# Patient Record
Sex: Female | Born: 1985 | Race: Black or African American | Hispanic: Yes | Marital: Single | Smoking: Former smoker
Health system: Southern US, Community
[De-identification: ages and names within clinical notes are randomized; demographics above are authoritative.]

## PROBLEM LIST (undated history)

## (undated) ENCOUNTER — Inpatient Hospital Stay (HOSPITAL_COMMUNITY): Payer: Self-pay

## (undated) DIAGNOSIS — R51 Headache: Secondary | ICD-10-CM

## (undated) DIAGNOSIS — J45909 Unspecified asthma, uncomplicated: Secondary | ICD-10-CM

## (undated) DIAGNOSIS — K219 Gastro-esophageal reflux disease without esophagitis: Secondary | ICD-10-CM

## (undated) DIAGNOSIS — D649 Anemia, unspecified: Secondary | ICD-10-CM

## (undated) DIAGNOSIS — F4310 Post-traumatic stress disorder, unspecified: Secondary | ICD-10-CM

## (undated) DIAGNOSIS — R519 Headache, unspecified: Secondary | ICD-10-CM

## (undated) DIAGNOSIS — F32A Depression, unspecified: Secondary | ICD-10-CM

## (undated) DIAGNOSIS — T7840XA Allergy, unspecified, initial encounter: Secondary | ICD-10-CM

## (undated) DIAGNOSIS — I951 Orthostatic hypotension: Secondary | ICD-10-CM

## (undated) DIAGNOSIS — F4481 Dissociative identity disorder: Secondary | ICD-10-CM

## (undated) DIAGNOSIS — F329 Major depressive disorder, single episode, unspecified: Secondary | ICD-10-CM

## (undated) HISTORY — PX: BREAST SURGERY: SHX581

## (undated) HISTORY — DX: Allergy, unspecified, initial encounter: T78.40XA

## (undated) HISTORY — DX: Depression, unspecified: F32.A

## (undated) HISTORY — DX: Major depressive disorder, single episode, unspecified: F32.9

---

## 2011-12-29 ENCOUNTER — Encounter (HOSPITAL_COMMUNITY): Payer: Self-pay | Admitting: *Deleted

## 2011-12-29 ENCOUNTER — Emergency Department (HOSPITAL_COMMUNITY)
Admission: EM | Admit: 2011-12-29 | Discharge: 2011-12-29 | Disposition: A | Discharge status: Home / Self Care | Payer: Medicare Other | Attending: Emergency Medicine | Admitting: Emergency Medicine

## 2011-12-29 DIAGNOSIS — Z79899 Other long term (current) drug therapy: Secondary | ICD-10-CM | POA: Insufficient documentation

## 2011-12-29 DIAGNOSIS — X58XXXA Exposure to other specified factors, initial encounter: Secondary | ICD-10-CM | POA: Insufficient documentation

## 2011-12-29 DIAGNOSIS — T783XXA Angioneurotic edema, initial encounter: Secondary | ICD-10-CM | POA: Insufficient documentation

## 2011-12-29 DIAGNOSIS — J45909 Unspecified asthma, uncomplicated: Secondary | ICD-10-CM | POA: Insufficient documentation

## 2011-12-29 DIAGNOSIS — R42 Dizziness and giddiness: Secondary | ICD-10-CM | POA: Insufficient documentation

## 2011-12-29 DIAGNOSIS — T7840XA Allergy, unspecified, initial encounter: Secondary | ICD-10-CM

## 2011-12-29 HISTORY — DX: Unspecified asthma, uncomplicated: J45.909

## 2011-12-29 MED ORDER — DIPHENHYDRAMINE HCL 25 MG PO TABS: 25.0000 mg | ORAL_TABLET | Freq: Four times a day (QID) | ORAL | Status: DC

## 2011-12-29 MED ORDER — IPRATROPIUM BROMIDE 0.02 % IN SOLN
RESPIRATORY_TRACT | Status: AC
  Administered 2011-12-29: 16:00:00
  Filled 2011-12-29: qty 2.5

## 2011-12-29 MED ORDER — EPINEPHRINE 0.15 MG/0.3ML IJ DEVI: 0.1500 mg | INTRAMUSCULAR | Status: DC | PRN

## 2011-12-29 MED ORDER — ALBUTEROL SULFATE (5 MG/ML) 0.5% IN NEBU
INHALATION_SOLUTION | RESPIRATORY_TRACT | Status: AC
  Administered 2011-12-29: 16:00:00
  Filled 2011-12-29: qty 2

## 2011-12-29 MED ORDER — FAMOTIDINE 20 MG PO TABS: 20.0000 mg | ORAL_TABLET | Freq: Two times a day (BID) | ORAL | Status: DC

## 2011-12-29 MED ORDER — PREDNISONE 10 MG PO TABS: 40.0000 mg | ORAL_TABLET | Freq: Every day | ORAL | Status: DC

## 2011-12-29 MED ORDER — PREDNISONE 20 MG PO TABS
60.0000 mg | ORAL_TABLET | Freq: Once | ORAL | Status: AC
  Administered 2011-12-29: 60 mg via ORAL
  Filled 2011-12-29: qty 3

## 2011-12-29 NOTE — ED Notes (Signed)
Shift change report received from NT Mock at bedside.  

## 2011-12-29 NOTE — ED Provider Notes (Signed)
History     CSN: [id number]  Arrival date & time 12/29/11  1522   First MD Initiated Contact with Patient 12/29/11 1546      Chief Complaint  Patient presents with  . Allergic Reaction    (Consider location/radiation/quality/duration/timing/severity/associated sxs/prior treatment) Patient is a 26 y.o. female presenting with allergic reaction. The history is provided by the patient.  Allergic Reaction The primary symptoms are  wheezing, shortness of breath, dizziness and angioedema. The current episode started 1 to 2 hours ago. Progression since onset: rapidly worsened initially, now rapidly improving. This is a new problem.  It is located on the lips and tongue. The angioedema is associated with shortness of breath. The angioedema is not associated with stridor.  The onset of the reaction was associated with eating.   patiently recently started receiving allergy shots every other day, probably for weeks ago. Received a shot yesterday, no change in formulation from prior trauma. No reaction immediately after shot. Was feeling normal until eating a bowl of fruit with peanut butter today around 2:20 PM. Gave herself an EpiPen injection and took 75 mg of Benadryl. EMS administered Zantac, albuterol, Atrovent given wheezing on their arrival. At the time of my interview, she complains of only lip swelling, tongue and throat itching. She denies any dizziness, shortness of breath, chest pain. No rash. No prior similar reactions. She has apparently eaten all of the foods consumed today without any reaction in the past.  Past Medical History  Diagnosis Date  . Asthma     History reviewed. No pertinent past surgical history.  History reviewed. No pertinent family history.  History  Substance Use Topics  . Smoking status: Not on file  . Smokeless tobacco: Not on file  . Alcohol Use: Yes     social drinker    Review of Systems  HENT:       See HPI  Respiratory: Positive for shortness of  breath and wheezing. Negative for stridor.   Neurological: Positive for dizziness.  10 systems reviewed and are otherwise negative for acute change except as noted in the HPI.   Allergies  Nsaids  Home Medications   Current Outpatient Rx  Name Route Sig Dispense Refill  . ACETAMINOPHEN 500 MG PO TABS Oral Take 1,000 mg by mouth every 6 (six) hours as needed.    . BUPROPION HCL ER (XL) 150 MG PO TB24 Oral Take 150 mg by mouth daily.    Marland Kitchen CETIRIZINE HCL 10 MG PO TABS Oral Take 10 mg by mouth daily.    Marland Kitchen DIPHENHYDRAMINE HCL 12.5 MG/5ML PO ELIX Oral Take 25 mg by mouth 4 (four) times daily as needed.    Marland Kitchen EPINEPHRINE 0.15 MG/0.3ML IJ DEVI Intramuscular Inject 0.15 mg into the muscle as needed.    Marland Kitchen ESCITALOPRAM OXALATE 10 MG PO TABS Oral Take 10 mg by mouth daily.    Marland Kitchen FORMOTEROL FUMARATE 12 MCG IN CAPS Inhalation Place 12 mcg into inhaler and inhale 2 (two) times daily.    . GUAIFENESIN ER 600 MG PO TB12 Oral Take 600 mg by mouth 2 (two) times daily as needed. allergy    . MOMETASONE FUROATE 220 MCG/INH IN AEPB Inhalation Inhale 1 puff into the lungs at bedtime.    Marland Kitchen MONTELUKAST SODIUM 10 MG PO TABS Oral Take 10 mg by mouth at bedtime.    . OMEPRAZOLE 20 MG PO CPDR Oral Take 20 mg by mouth daily.      BP 105/60  Pulse  76  Temp 98.4 F (36.9 C) (Oral)  Resp 22  SpO2 100%  Physical Exam  Nursing note reviewed. Constitutional: She is oriented to person, place, and time. She appears well-developed and well-nourished. No distress.       Vital signs are reviewed and are normal.  HENT:  Head: Normocephalic and atraumatic. No trismus in the jaw.  Right Ear: Tympanic membrane and external ear normal.  Left Ear: Tympanic membrane and external ear normal.  Nose: Nose normal.  Mouth/Throat: Mucous membranes are normal. Uvula swelling (slight) present.    Eyes: Conjunctivae are normal. Pupils are equal, round, and reactive to light.  Neck: Phonation normal.  Cardiovascular: Normal rate,  regular rhythm and normal heart sounds.   Pulmonary/Chest: Effort normal and breath sounds normal. No stridor. No respiratory distress. She has no wheezes. She has no rales.  Abdominal: Soft. Bowel sounds are normal. She exhibits no distension. There is no tenderness.  Musculoskeletal: She exhibits no edema.  Neurological: She is alert and oriented to person, place, and time.       Speech clear and appropriate  Skin: Skin is warm and dry. No rash noted.  Psychiatric: She has a normal mood and affect.    ED Course  Procedures (including critical care time)  Labs Reviewed - No data to display No results found.   Date: 12/29/2011  Rate: 77  Rhythm: normal sinus rhythm  QRS Axis: normal  Intervals: normal  ST/T Wave abnormalities: normal  Conduction Disutrbances: none  Narrative Interpretation:   Old EKG Reviewed: no prior available.      1. Allergic reaction       MDM  Allergic reaction to unknown food. Epi, benadryl, zantac given PTA with near resolution of symptoms. Prednisone added in ED. Pt in no distress, airway patent, will continue to monitor for bouce-back as epi wears off.   7:33 PM Pt remains stable. Uvula swelling resolved, lip swelling near-resolved. She will f.u with allergist (with whom she has been in contact during her ED stay) and will be provided with epi pen refill. Continue pepcid, benadryl, prednisone. Return precautions discussed.  [address] [place], [place] 01/02/12 [id number]

## 2011-12-29 NOTE — ED Notes (Addendum)
Per ems pt recently started on an autoimmune medication to deal with new allergies. Pt has hx of asthma. 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 Pt ate assortment on fruit, bananas, apples, and peanut butter around 1420. Pt called ems and report that 4 min after pt ate food she could feel her lips, cheeks, and throat swelling. Denies previous allergic reaction. Primary doctor did prescribe epi pen due to new autoimmune rx and told pt to take benadryl 30 min before she takes the medication. Pt started having allergic reaction and did give herself epi pen and estimated 75 mg of benadryl.   Upon ems arrival lung fields tight, audible expiratory wheezing. 20 G L AC. ems gave 10mg  allbuterol, 0.5 mg atrovent. 50 mg zantac. No more benadryl given.   Upon rn assessment pt denies sob, reports central chest pain and facial tingling.  Pt reports that she has an appt with dr in a week to "get an EKG done' and that pt has been having chest pain x 1 week.

## 2011-12-29 NOTE — ED Notes (Signed)
Bedside report received from previous RN 

## 2012-01-04 NOTE — ED Provider Notes (Signed)
Medical screening examination/treatment/procedure(s) were performed by non-physician practitioner and as supervising physician I was immediately available for consultation/collaboration.   Nat Christen, MD 01/04/12 [phone]

## 2012-06-06 ENCOUNTER — Encounter (HOSPITAL_COMMUNITY): Payer: Self-pay | Admitting: Emergency Medicine

## 2012-06-06 DIAGNOSIS — J45901 Unspecified asthma with (acute) exacerbation: Secondary | ICD-10-CM | POA: Insufficient documentation

## 2012-06-06 MED ORDER — ALBUTEROL SULFATE (5 MG/ML) 0.5% IN NEBU
INHALATION_SOLUTION | RESPIRATORY_TRACT | Status: AC
  Administered 2012-06-06: 5 mg via RESPIRATORY_TRACT
  Filled 2012-06-06: qty 1

## 2012-06-06 MED ORDER — IPRATROPIUM BROMIDE 0.02 % IN SOLN
RESPIRATORY_TRACT | Status: AC
  Administered 2012-06-06: 0.5 mg via RESPIRATORY_TRACT
  Filled 2012-06-06: qty 2.5

## 2012-06-06 MED ORDER — IPRATROPIUM BROMIDE 0.02 % IN SOLN
0.5000 mg | Freq: Once | RESPIRATORY_TRACT | Status: AC
  Administered 2012-06-06: 0.5 mg via RESPIRATORY_TRACT

## 2012-06-06 MED ORDER — ALBUTEROL SULFATE (5 MG/ML) 0.5% IN NEBU
5.0000 mg | INHALATION_SOLUTION | Freq: Once | RESPIRATORY_TRACT | Status: AC
  Administered 2012-06-06: 5 mg via RESPIRATORY_TRACT

## 2012-06-06 NOTE — ED Notes (Signed)
Patient came into triage with wheezing and shortness of breath.  Unable to speak in full sentences.  Nebulizer treatment started in triage.

## 2012-06-07 ENCOUNTER — Emergency Department (HOSPITAL_COMMUNITY)
Admission: EM | Admit: 2012-06-07 | Discharge: 2012-06-07 | Discharge status: Against Medical Advice | Payer: Medicare Other | Attending: Emergency Medicine | Admitting: Emergency Medicine

## 2012-06-07 NOTE — ED Notes (Signed)
Called pt twice to take back to A9. No response from pt. Pt status moved off floor.

## 2012-10-14 ENCOUNTER — Ambulatory Visit (INDEPENDENT_AMBULATORY_CARE_PROVIDER_SITE_OTHER): Payer: Medicare Other | Admitting: Emergency Medicine

## 2012-10-14 VITALS — BP 110/60 | HR 90 | Temp 98.5°F | Resp 16 | Ht 65.6 in | Wt 263.0 lb

## 2012-10-14 DIAGNOSIS — F4310 Post-traumatic stress disorder, unspecified: Secondary | ICD-10-CM | POA: Insufficient documentation

## 2012-10-14 DIAGNOSIS — F4481 Dissociative identity disorder: Secondary | ICD-10-CM

## 2012-10-14 DIAGNOSIS — R51 Headache: Secondary | ICD-10-CM

## 2012-10-14 DIAGNOSIS — F319 Bipolar disorder, unspecified: Secondary | ICD-10-CM

## 2012-10-14 MED ORDER — ONDANSETRON 4 MG PO TBDP: 4.0000 mg | ORAL_TABLET | Freq: Once | ORAL | Status: DC

## 2012-10-14 NOTE — Progress Notes (Signed)
[facility] [address], [place] [address] [phone]  Date:  10/14/2012   Name:  Shannon Aguirre   DOB:  06-05-1985   MRN:  [id number]  PCP:  Jackie Plum, MD    Chief Complaint: Headache, Abdominal Pain and Emesis   History of Present Illness:  Shannon Aguirre is a 27 y.o. very pleasant female patient who presents with the following:  Has a headache over the past four days that is worse than any other headache.  She often has tension headaches but she says these are located in her temples and are unilateral.  This headache is bitemporal and extends globally. Now photophobia, but no neuro or visual symptoms.  No cough, fever chills, or coryza.  Nauseated and vomiting since last night at 0200.  No history of antecedent infection or injury.  No relief of her symptoms with tylenol. She has anaphylaxis with NSAID.  No improvement with over the counter medications or other home remedies. Denies other complaint or health concern today.   Patient Active Problem List   Diagnosis Date Noted  . PTSD (post-traumatic stress disorder) 10/14/2012  . Bipolar disorder, unspecified 10/14/2012  . 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 Asthma   . Allergy   . Depression     Past Surgical History  Procedure Laterality Date  . Breast surgery      History  Substance Use Topics  . Smoking status: Current Every Day Smoker -- 0.50 packs/day for 8 years  . Smokeless tobacco: Not on file  . Alcohol Use: Yes     Comment: social drinker    Family History  Problem Relation Age of Onset  . Bipolar disorder Mother   . Cancer Father   . Sickle cell anemia Brother   . Breast cancer Maternal Grandmother   . Diabetes Maternal Grandmother   . Diabetes Maternal Grandfather     Allergies  Allergen Reactions  . Nsaids Anaphylaxis    Medication list has been reviewed and updated.  Current Outpatient Prescriptions on File  Prior to Visit  Medication Sig Dispense Refill  . buPROPion (WELLBUTRIN XL) 150 MG 24 hr tablet Take 150 mg by mouth daily.      . formoterol (FORADIL) 12 MCG capsule for inhaler Place 12 mcg into inhaler and inhale 2 (two) times daily.      Marland Kitchen omeprazole (PRILOSEC) 20 MG capsule Take 20 mg by mouth daily.      Marland Kitchen acetaminophen (TYLENOL) 500 MG tablet Take 1,000 mg by mouth every 6 (six) hours as needed.      . cetirizine (ALL DAY ALLERGY) 10 MG tablet Take 10 mg by mouth daily.      . diphenhydrAMINE (BENADRYL) 12.5 MG/5ML elixir Take 25 mg by mouth 4 (four) times daily as needed.      . diphenhydrAMINE (BENADRYL) 25 MG tablet Take 1 tablet (25 mg total) by mouth every 6 (six) hours.  20 tablet  0  . EPINEPHrine (EPIPEN JR) 0.15 MG/0.3ML injection Inject 0.3 mLs (0.15 mg total) into the muscle as needed.  1 each  0  . escitalopram (LEXAPRO) 10 MG tablet Take 10 mg by mouth daily.      . famotidine (PEPCID) 20 MG tablet Take 1 tablet (20 mg total) by mouth 2 (two) times daily.  14 tablet  0  . guaiFENesin (MUCINEX) 600 MG 12 hr tablet Take 600 mg by mouth 2 (two) times daily  as needed. allergy      . mometasone (ASMANEX) 220 MCG/INH inhaler Inhale 1 puff into the lungs at bedtime.      . montelukast (SINGULAIR) 10 MG tablet Take 10 mg by mouth at bedtime.      . predniSONE (DELTASONE) 10 MG tablet Take 4 tablets (40 mg total) by mouth daily.  20 tablet  0   No current facility-administered medications on file prior to visit.    Review of Systems:  As per HPI, otherwise negative.    Physical Examination: Filed Vitals:   10/14/12 1659  BP: 110/60  Pulse: 90  Temp: 98.5 F (36.9 C)  Resp: 16   Filed Vitals:   10/14/12 1659  Height: 5' 5.6" (1.666 m)  Weight: 263 lb (119.296 kg)   Body mass index is 42.98 kg/(m^2). Ideal Body Weight: Weight in (lb) to have BMI = 25: 152.7  GEN: WDWN, moderate distress, Non-toxic, A & O x 3  CN 2-12 intact.  PRRERLA EOMI  Fundi benign HEENT:  Atraumatic, Normocephalic. Neck supple. No masses, No LAD. Ears and Nose: No external deformity. CV: RRR, No M/G/R. No JVD. No thrill. No extra heart sounds. PULM: CTA B, no wheezes, crackles, rhonchi. No retractions. No resp. distress. No accessory muscle use. ABD: S, NT, ND, +BS. No rebound. No HSM. EXTR: No c/c/e NEURO Normal gait.  PSYCH: Normally interactive. Conversant. Not depressed or anxious appearing.  Calm demeanor.    Assessment and Plan: Atypical headache. To ER for evaluation likely will need CT zofran   Signed,  Phillips Odor, MD

## 2013-04-05 ENCOUNTER — Other Ambulatory Visit: Payer: Self-pay

## 2013-06-23 ENCOUNTER — Ambulatory Visit (INDEPENDENT_AMBULATORY_CARE_PROVIDER_SITE_OTHER): Payer: Medicare Other | Admitting: Emergency Medicine

## 2013-06-23 VITALS — BP 110/70 | HR 70 | Temp 98.0°F | Resp 16 | Ht 65.0 in | Wt 255.0 lb

## 2013-06-23 DIAGNOSIS — R112 Nausea with vomiting, unspecified: Secondary | ICD-10-CM

## 2013-06-23 DIAGNOSIS — A088 Other specified intestinal infections: Secondary | ICD-10-CM

## 2013-06-23 LAB — POCT UA - MICROSCOPIC ONLY
Casts, Ur, LPF, POC: NEGATIVE
Crystals, Ur, HPF, POC: NEGATIVE
RBC, URINE, MICROSCOPIC: NEGATIVE
Yeast, UA: NEGATIVE

## 2013-06-23 LAB — POCT CBC
Granulocyte percent: 66 %G (ref 37–80)
HCT, POC: 40.1 % (ref 37.7–47.9)
Hemoglobin: 12.2 g/dL (ref 12.2–16.2)
Lymph, poc: 3.1 (ref 0.6–3.4)
MCH: 27.8 pg (ref 27–31.2)
MCHC: 30.4 g/dL — AB (ref 31.8–35.4)
MCV: 91.3 fL (ref 80–97)
MID (CBC): 0.4 (ref 0–0.9)
MPV: 9.8 fL (ref 0–99.8)
PLATELET COUNT, POC: 311 10*3/uL (ref 142–424)
POC Granulocyte: 6.9 (ref 2–6.9)
POC LYMPH %: 30 % (ref 10–50)
POC MID %: 4 %M (ref 0–12)
RBC: 4.39 M/uL (ref 4.04–5.48)
RDW, POC: 14.4 %
WBC: 10.5 10*3/uL — AB (ref 4.6–10.2)

## 2013-06-23 LAB — POCT URINALYSIS DIPSTICK
Bilirubin, UA: NEGATIVE
Blood, UA: NEGATIVE
GLUCOSE UA: NEGATIVE
Ketones, UA: NEGATIVE
Leukocytes, UA: NEGATIVE
NITRITE UA: NEGATIVE
Protein, UA: NEGATIVE
Spec Grav, UA: 1.02
UROBILINOGEN UA: 1
pH, UA: 7.5

## 2013-06-23 LAB — POCT URINE PREGNANCY: PREG TEST UR: NEGATIVE

## 2013-06-23 MED ORDER — ONDANSETRON 8 MG PO TBDP: 8.0000 mg | ORAL_TABLET | Freq: Three times a day (TID) | ORAL | Status: DC | PRN

## 2013-06-23 NOTE — Progress Notes (Signed)
[facility] [address], [place] [id number] [phone]  Date:  06/23/2013   Name:  Shannon Aguirre   DOB:  06/02/1985   MRN:  [id number]  PCP:  Jackie PlumSEI-BONSU,GEORGE, MD    Chief Complaint: Emesis   History of Present Illness:  Shannon Boasturea Cocke is a 28 y.o. very pleasant female patient who presents with the following:  Ill with nausea and vomiting yesterday.  Had a fever and chills.  No diarrhea.  Had poor po intake last night and today.  Today when she got up and passed out.  No cough or coryza.  Nauseated today and vomited last around 0430.  Not sexually active.  No abdominal pain.  No cough or coryza.  Some wheezing associated with exacerbation of her asthma.  No sick contact.  No improvement with over the counter medications or other home remedies. Denies other complaint or health concern today.  Patient Active Problem List   Diagnosis Date Noted  . PTSD (post-traumatic stress disorder) 10/14/2012  . Bipolar disorder, unspecified 10/14/2012  . 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 Asthma   . Allergy   . Depression     Past Surgical History  Procedure Laterality Date  . Breast surgery      History  Substance Use Topics  . Smoking status: Current Every Day Smoker -- 0.50 packs/day for 8 years  . Smokeless tobacco: Not on file  . Alcohol Use: Yes     Comment: social drinker    Family History  Problem Relation Age of Onset  . Bipolar disorder Mother   . Cancer Father   . Sickle cell anemia Brother   . Breast cancer Maternal Grandmother   . Diabetes Maternal Grandmother   . Diabetes Maternal Grandfather     Allergies  Allergen Reactions  . Nsaids Anaphylaxis    Medication list has been reviewed and updated.  Current Outpatient Prescriptions on File Prior to Visit  Medication Sig Dispense Refill  . acetaminophen (TYLENOL) 500 MG tablet Take 1,000 mg by mouth every 6 (six) hours as needed.      Marland Kitchen.  albuterol (PROVENTIL HFA;VENTOLIN HFA) 108 (90 BASE) MCG/ACT inhaler Inhale 2 puffs into the lungs every 6 (six) hours as needed for wheezing.      Marland Kitchen. buPROPion (WELLBUTRIN XL) 150 MG 24 hr tablet Take 150 mg by mouth daily.      . cetirizine (ALL DAY ALLERGY) 10 MG tablet Take 10 mg by mouth daily.      . diphenhydrAMINE (BENADRYL) 12.5 MG/5ML elixir Take 25 mg by mouth 4 (four) times daily as needed.      . diphenhydrAMINE (BENADRYL) 25 MG tablet Take 1 tablet (25 mg total) by mouth every 6 (six) hours.  20 tablet  0  . EPINEPHrine (EPIPEN JR) 0.15 MG/0.3ML injection Inject 0.3 mLs (0.15 mg total) into the muscle as needed.  1 each  0  . escitalopram (LEXAPRO) 10 MG tablet Take 10 mg by mouth daily.      . famotidine (PEPCID) 20 MG tablet Take 1 tablet (20 mg total) by mouth 2 (two) times daily.  14 tablet  0  . formoterol (FORADIL) 12 MCG capsule for inhaler Place 12 mcg into inhaler and inhale 2 (two) times daily.      Marland Kitchen. guaiFENesin (MUCINEX) 600 MG 12 hr tablet Take 600 mg by mouth 2 (two) times daily as needed. allergy      .  hydrOXYzine (ATARAX/VISTARIL) 25 MG tablet Take 25 mg by mouth 3 (three) times daily as needed for itching.      . lithium 300 MG tablet Take 300 mg by mouth 3 (three) times daily.      . mometasone (ASMANEX) 220 MCG/INH inhaler Inhale 1 puff into the lungs at bedtime.      . montelukast (SINGULAIR) 10 MG tablet Take 10 mg by mouth at bedtime.      Marland Kitchen omeprazole (PRILOSEC) 20 MG capsule Take 20 mg by mouth daily.      . predniSONE (DELTASONE) 10 MG tablet Take 4 tablets (40 mg total) by mouth daily.  20 tablet  0   Current Facility-Administered Medications on File Prior to Visit  Medication Dose Route Frequency Provider Last Rate Last Dose  . ondansetron (ZOFRAN-ODT) disintegrating tablet 4 mg  4 mg Oral Once Phillips Odor, MD        Review of Systems:  As per HPI, otherwise negative.    Physical Examination: Filed Vitals:   06/23/13 1528  BP: 110/70   Pulse: 70  Temp: 98 F (36.7 C)  Resp: 16   Filed Vitals:   06/23/13 1528  Height: 5\' 5"  (1.651 m)  Weight: 255 lb (115.667 kg)   Body mass index is 42.43 kg/(m^2). Ideal Body Weight: Weight in (lb) to have BMI = 25: 149.9  GEN: WDWN, NAD, Non-toxic, A & O x 3 HEENT: Atraumatic, Normocephalic. Neck supple. No masses, No LAD. Ears and Nose: No external deformity. CV: RRR, No M/G/R. No JVD. No thrill. No extra heart sounds. PULM: CTA B, no wheezes, crackles, rhonchi. No retractions. No resp. distress. No accessory muscle use. ABD: S, tender R & L suprapubic area, ND, +BS. No rebound. No HSM. EXTR: No c/c/e NEURO Normal gait.  PSYCH: Normally interactive. Conversant. Not depressed or anxious appearing.  Calm demeanor.    Assessment and Plan: Nausea and vomiting zofran  Signed,  Phillips Odor, MD   Results for orders placed in visit on 06/23/13  POCT URINALYSIS DIPSTICK      Result Value Range   Color, UA yellow     Clarity, UA cloudy     Glucose, UA neg     Bilirubin, UA neg     Ketones, UA neg     Spec Grav, UA 1.020     Blood, UA neg     pH, UA 7.5     Protein, UA neg     Urobilinogen, UA 1.0     Nitrite, UA neg     Leukocytes, UA Negative    POCT UA - MICROSCOPIC ONLY      Result Value Range   WBC, Ur, HPF, POC 0-2     RBC, urine, microscopic neg     Bacteria, U Microscopic 1+     Mucus, UA large     Epithelial cells, urine per micros 0-2     Crystals, Ur, HPF, POC neg     Casts, Ur, LPF, POC neg     Yeast, UA neg    POCT URINE PREGNANCY      Result Value Range   Preg Test, Ur Negative    POCT CBC      Result Value Range   WBC 10.5 (*) 4.6 - 10.2 K/uL   Lymph, poc 3.1  0.6 - 3.4   POC LYMPH PERCENT 30.0  10 - 50 %L   MID (cbc) 0.4  0 - 0.9   POC MID % 4.0  0 - 12 %M   POC Granulocyte 6.9  2 - 6.9   Granulocyte percent 66.0  37 - 80 %G   RBC 4.39  4.04 - 5.48 M/uL   Hemoglobin 12.2  12.2 - 16.2 g/dL   HCT, POC 16.1  09.6 - 47.9 %   MCV 91.3   80 - 97 fL   MCH, POC 27.8  27 - 31.2 pg   MCHC 30.4 (*) 31.8 - 35.4 g/dL   RDW, POC 04.5     Platelet Count, POC 311  142 - 424 K/uL   MPV 9.8  0 - 99.8 fL

## 2013-06-23 NOTE — Patient Instructions (Signed)
Diet The clear liquid diet consists of foods that are liquid or will become liquid at room temperature. Examples of foods allowed on a clear liquid diet include fruit juice, broth or bouillon, gelatin, or frozen ice pops. You should be able to see through the liquid. The purpose of this diet is to provide the necessary fluids, electrolytes (such as sodium and potassium), and energy to keep the body functioning during times when you are not able to consume a regular diet. A clear liquid diet should not be continued for long periods of time, as it is not nutritionally adequate.  A CLEAR LIQUID DIET MAY BE NEEDED:  When a sudden-onset (acute) condition occurs before or after surgery.   As the first step in oral feeding.   For fluid and electrolyte replacement in diarrheal diseases.   As a diet before certain medical tests are performed.  ADEQUACY The clear liquid diet is adequate only in ascorbic acid, according to the Recommended Dietary Allowances of the National Research Council.  CHOOSING FOODS Breads and Starches  Allowed: None are allowed.   Avoid: All are to be avoided.  Vegetables  Allowed: Strained vegetable juices.   Avoid: Any others.  Fruit  Allowed: Strained fruit juices and fruit drinks. Include 1 serving of citrus or vitamin C-enriched fruit juice daily.   Avoid: Any others.  Meat and Meat Substitutes  Allowed: None are allowed.   Avoid: All are to be avoided.  Milk Products  Allowed: None are allowed.   Avoid: All are to be avoided.  Soups and Combination Foods  Allowed: Clear bouillon, broth, or strained broth-based soups.   Avoid: Any others.  Desserts and Sweets  Allowed: Sugar, honey. High-protein gelatin. Flavored gelatin, ices, or frozen ice pops that do not contain milk.   Avoid: Any others.  Fats and Oils  Allowed: None are allowed.   Avoid: All are to be avoided.  Beverages  Allowed: Cereal  beverages, coffee (regular or decaffeinated), tea, or soda at the discretion of your health care provider.   Avoid: Any others.  Condiments  Allowed: Salt.   Avoid: Any others, including pepper.  Supplements  Allowed: Liquid nutrition beverages that you can see through.   Avoid: Any others that contain lactose or fiber. SAMPLE MEAL PLAN Breakfast  4 oz (120 mL) strained orange juice.   to 1 cup (120 to 240 mL) gelatin (plain or fortified).  1 cup (240 mL) beverage (coffee or tea).  Sugar, if desired. Midmorning Snack   cup (120 mL) gelatin (plain or fortified). Lunch  1 cup (240 mL) broth or consomm.  4 oz (120 mL) strained grapefruit juice.   cup (120 mL) gelatin (plain or fortified).  1 cup (240 mL) beverage (coffee or tea).  Sugar, if desired. Midafternoon Snack   cup (120 mL) fruit ice.   cup (120 mL) strained fruit juice. Dinner  1 cup (240 mL) broth or consomm.   cup (120 mL) cranberry juice.   cup (120 mL) flavored gelatin (plain or fortified).  1 cup (240 mL) beverage (coffee or tea).  Sugar, if desired. Evening Snack  4 oz (120 mL) strained apple juice (vitamin C-fortified).   cup (120 mL) flavored gelatin (plain or fortified). MAKE SURE YOU:  Understand these instructions.  Will watch your child's condition.  Will get help right away if your child is not doing well or gets worse. Document Released: 05/17/2005 Document Revised: 01/17/2013 Document Reviewed: 10/17/2012 Exit[employer] Patient Information 2014 [employer]. Viral   Gastroenteritis Viral gastroenteritis is also known as stomach flu. This condition affects the stomach and intestinal tract. It can cause sudden diarrhea and vomiting. The illness typically lasts 3 to 8 days. Most people develop an immune response that eventually gets rid of the virus. While this natural response develops, the virus can make you quite ill. CAUSES  Many different viruses can cause  gastroenteritis, such as rotavirus or noroviruses. You can catch one of these viruses by consuming contaminated food or water. You may also catch a virus by sharing utensils or other personal items with an infected person or by touching a contaminated surface. SYMPTOMS  The most common symptoms are diarrhea and vomiting. These problems can cause a severe loss of body fluids (dehydration) and a body salt (electrolyte) imbalance. Other symptoms may include:  Fever.  Headache.  Fatigue.  Abdominal pain. DIAGNOSIS  Your caregiver can usually diagnose viral gastroenteritis based on your symptoms and a physical exam. A stool sample may also be taken to test for the presence of viruses or other infections. TREATMENT  This illness typically goes away on its own. Treatments are aimed at rehydration. The most serious cases of viral gastroenteritis involve vomiting so severely that you are not able to keep fluids down. In these cases, fluids must be given through an intravenous line (IV). HOME CARE INSTRUCTIONS   Drink enough fluids to keep your urine clear or pale yellow. Drink small amounts of fluids frequently and increase the amounts as tolerated.  Ask your caregiver for specific rehydration instructions.  Avoid:  Foods high in sugar.  Alcohol.  Carbonated drinks.  Tobacco.  Juice.  Caffeine drinks.  Extremely hot or cold fluids.  Fatty, greasy foods.  Too much intake of anything at one time.  Dairy products until 24 to 48 hours after diarrhea stops.  You may consume probiotics. Probiotics are active cultures of beneficial bacteria. They may lessen the amount and number of diarrheal stools in adults. Probiotics can be found in yogurt with active cultures and in supplements.  Wash your hands well to avoid spreading the virus.  Only take over-the-counter or prescription medicines for pain, discomfort, or fever as directed by your caregiver. Do not give aspirin to children.  Antidiarrheal medicines are not recommended.  Ask your caregiver if you should continue to take your regular prescribed and over-the-counter medicines.  Keep all follow-up appointments as directed by your caregiver. SEEK IMMEDIATE MEDICAL CARE IF:   You are unable to keep fluids down.  You do not urinate at least once every 6 to 8 hours.  You develop shortness of breath.  You notice blood in your stool or vomit. This may look like coffee grounds.  You have abdominal pain that increases or is concentrated in one small area (localized).  You have persistent vomiting or diarrhea.  You have a fever.  The patient is a child younger than 3 months, and he or she has a fever.  The patient is a child older than 3 months, and he or she has a fever and persistent symptoms.  The patient is a child older than 3 months, and he or she has a fever and symptoms suddenly get worse.  The patient is a baby, and he or she has no tears when crying. MAKE SURE YOU:   Understand these instructions.  Will watch your condition.  Will get help right away if you are not doing well or get worse. Document Released: 05/17/2005 Document Revised: 08/09/2011 Document Reviewed: 03/03/2011   ExitCare Patient Information 2014 [employer].  

## 2013-07-06 ENCOUNTER — Other Ambulatory Visit: Payer: Self-pay | Admitting: Gastroenterology

## 2013-07-06 DIAGNOSIS — R1010 Upper abdominal pain, unspecified: Secondary | ICD-10-CM

## 2013-07-13 ENCOUNTER — Ambulatory Visit
Admission: RE | Admit: 2013-07-13 | Discharge: 2013-07-13 | Disposition: A | Discharge status: Home / Self Care | Payer: Medicare Other | Source: Ambulatory Visit | Attending: Gastroenterology | Admitting: Gastroenterology

## 2013-07-13 DIAGNOSIS — R1010 Upper abdominal pain, unspecified: Secondary | ICD-10-CM

## 2013-09-14 ENCOUNTER — Emergency Department (HOSPITAL_COMMUNITY)
Admission: EM | Admit: 2013-09-14 | Discharge: 2013-09-14 | Disposition: A | Discharge status: Home / Self Care | Payer: Medicare Other | Attending: Emergency Medicine | Admitting: Emergency Medicine

## 2013-09-14 ENCOUNTER — Encounter (HOSPITAL_COMMUNITY): Payer: Self-pay | Admitting: Emergency Medicine

## 2013-09-14 ENCOUNTER — Emergency Department (HOSPITAL_COMMUNITY): Payer: Medicare Other

## 2013-09-14 DIAGNOSIS — R51 Headache: Secondary | ICD-10-CM | POA: Insufficient documentation

## 2013-09-14 DIAGNOSIS — J45909 Unspecified asthma, uncomplicated: Secondary | ICD-10-CM | POA: Insufficient documentation

## 2013-09-14 DIAGNOSIS — Z3490 Encounter for supervision of normal pregnancy, unspecified, unspecified trimester: Secondary | ICD-10-CM

## 2013-09-14 DIAGNOSIS — Z8659 Personal history of other mental and behavioral disorders: Secondary | ICD-10-CM | POA: Insufficient documentation

## 2013-09-14 DIAGNOSIS — M549 Dorsalgia, unspecified: Secondary | ICD-10-CM | POA: Insufficient documentation

## 2013-09-14 DIAGNOSIS — Z79899 Other long term (current) drug therapy: Secondary | ICD-10-CM | POA: Insufficient documentation

## 2013-09-14 DIAGNOSIS — N949 Unspecified condition associated with female genital organs and menstrual cycle: Secondary | ICD-10-CM | POA: Insufficient documentation

## 2013-09-14 DIAGNOSIS — O99330 Smoking (tobacco) complicating pregnancy, unspecified trimester: Secondary | ICD-10-CM | POA: Insufficient documentation

## 2013-09-14 DIAGNOSIS — O9989 Other specified diseases and conditions complicating pregnancy, childbirth and the puerperium: Secondary | ICD-10-CM | POA: Insufficient documentation

## 2013-09-14 LAB — URINALYSIS, ROUTINE W REFLEX MICROSCOPIC
BILIRUBIN URINE: NEGATIVE
Glucose, UA: NEGATIVE mg/dL
HGB URINE DIPSTICK: NEGATIVE
Ketones, ur: NEGATIVE mg/dL
Leukocytes, UA: NEGATIVE
Nitrite: NEGATIVE
PROTEIN: NEGATIVE mg/dL
SPECIFIC GRAVITY, URINE: 1.008 (ref 1.005–1.030)
UROBILINOGEN UA: 0.2 mg/dL (ref 0.0–1.0)
pH: 6 (ref 5.0–8.0)

## 2013-09-14 LAB — WET PREP, GENITAL
Trich, Wet Prep: NONE SEEN
Yeast Wet Prep HPF POC: NONE SEEN

## 2013-09-14 LAB — ABO/RH: ABO/RH(D): O POS

## 2013-09-14 LAB — HIV ANTIBODY (ROUTINE TESTING W REFLEX): HIV 1&2 Ab, 4th Generation: NONREACTIVE

## 2013-09-14 LAB — PREGNANCY, URINE: Preg Test, Ur: POSITIVE — AB

## 2013-09-14 LAB — RPR

## 2013-09-14 MED ORDER — ACETAMINOPHEN 325 MG PO TABS
325.0000 mg | ORAL_TABLET | Freq: Once | ORAL | Status: AC
  Administered 2013-09-14: 325 mg via ORAL
  Filled 2013-09-14: qty 1

## 2013-09-14 MED ORDER — ONDANSETRON HCL 4 MG PO TABS: 4.0000 mg | ORAL_TABLET | Freq: Three times a day (TID) | ORAL | Status: DC | PRN

## 2013-09-14 MED ORDER — ONDANSETRON 4 MG PO TBDP
4.0000 mg | ORAL_TABLET | Freq: Once | ORAL | Status: AC
  Administered 2013-09-14: 4 mg via ORAL
  Filled 2013-09-14: qty 1

## 2013-09-14 NOTE — ED Notes (Signed)
Ultrasound complete 

## 2013-09-14 NOTE — ED Provider Notes (Signed)
CSN: [id number]     Arrival date & time 09/14/13  0254 History   First MD Initiated Contact with Patient 09/14/13 0330     Chief Complaint  Patient presents with  . Pelvic Pain     (Consider location/radiation/quality/duration/timing/severity/associated sxs/prior Treatment) HPI Comments: Patient has been experiencing pelvic pain for several, months.  She's been followed by her primary care physician, without definitive diagnosis.  Several days, ago.  She was told she was 6, weeks pregnant.  She has had some nausea, headaches, recently.  Tonight, while walking.  She had a sharp pain that originated in her left buttock region.  Her back causing her to almost lose her footing on the stairs.  Denies any trauma, previous history of back injury.  Denies vaginal discharge, dysuria, frequency, vaginal bleeding  Patient is a 28 y.o. female presenting with pelvic pain. The history is provided by the patient.  Pelvic Pain This is a new problem. The current episode started more than 1 month ago. The problem occurs constantly. The problem has been gradually worsening. Associated symptoms include headaches. Pertinent negatives include no chills, fever, rash or urinary symptoms. The symptoms are aggravated by walking. She has tried nothing for the symptoms. The treatment provided no relief.    Past Medical History  Diagnosis Date  . Asthma   . Allergy   . Depression    Past Surgical History  Procedure Laterality Date  . Breast surgery     Family History  Problem Relation Age of Onset  . Bipolar disorder Mother   . Cancer Father   . Sickle cell anemia Brother   . Breast cancer Maternal Grandmother   . Diabetes Maternal Grandmother   . Diabetes Maternal Grandfather    History  Substance Use Topics  . Smoking status: Current Every Day Smoker -- 0.50 packs/day for 8 years    Types: Cigarettes  . Smokeless tobacco: Not on file  . Alcohol Use: Yes     Comment: social drinker   OB History    Grav Para Term Preterm Abortions TAB SAB Ect Mult Living                 Review of Systems  Constitutional: Negative for fever and chills.  Genitourinary: Positive for pelvic pain. Negative for dysuria, vaginal bleeding and vaginal discharge.  Musculoskeletal: Positive for back pain.  Skin: Negative for rash and wound.  Neurological: Positive for headaches. Negative for dizziness.  All other systems reviewed and are negative.     Allergies  Nsaids  Home Medications   Prior to Admission medications   Medication Sig Start Date End Date Taking? Authorizing Provider  acetaminophen (TYLENOL) 500 MG tablet Take 1,000 mg by mouth every 6 (six) hours as needed.   Yes Historical Provider, MD  albuterol (PROVENTIL HFA;VENTOLIN HFA) 108 (90 BASE) MCG/ACT inhaler Inhale 2 puffs into the lungs every 6 (six) hours as needed for wheezing.   Yes Historical Provider, MD  albuterol (PROVENTIL) (2.5 MG/3ML) 0.083% nebulizer solution Take 2.5 mg by nebulization every 6 (six) hours as needed for wheezing or shortness of breath.   Yes Historical Provider, MD  diphenhydrAMINE (BENADRYL) 12.5 MG/5ML elixir Take 25 mg by mouth 4 (four) times daily as needed.   Yes Historical Provider, MD  Prenatal Vit-Fe Fumarate-FA (PRENATAL MULTIVITAMIN) TABS tablet Take 1 tablet by mouth daily at 12 noon.   Yes Historical Provider, MD  EPINEPHrine (EPIPEN JR) 0.15 MG/0.3ML injection Inject 0.3 mLs (0.15 mg total) into the  muscle as needed. 12/29/11   Shaaron AdlerStephanie Justice Wolfe, PA-C   BP 115/57  Pulse 77  Temp(Src) 97.9 F (36.6 C) (Oral)  Resp 18  Ht 5\' 4"  (1.626 m)  Wt 273 lb (123.832 kg)  BMI 46.84 kg/m2  SpO2 98%  LMP 07/25/2013 Physical Exam  Nursing note and vitals reviewed. Constitutional: She appears well-developed and well-nourished.  HENT:  Head: Normocephalic.  Eyes: Pupils are equal, round, and reactive to light.  Neck: Normal range of motion.  Cardiovascular: Normal rate and regular rhythm.    Pulmonary/Chest: Effort normal and breath sounds normal.  Abdominal: Soft. Bowel sounds are normal.  Obese  Genitourinary: Vagina normal. No vaginal discharge found.  Mild tenderness.  On exam  Musculoskeletal: Normal range of motion.  Neurological: She is alert.  Skin: Skin is warm.    ED Course  Procedures (including critical care time) Labs Review Labs Reviewed  WET PREP, GENITAL - Abnormal; Notable for the following:    Clue Cells Wet Prep HPF POC FEW (*)    WBC, Wet Prep HPF POC FEW (*)    All other components within normal limits  PREGNANCY, URINE - Abnormal; Notable for the following:    Preg Test, Ur POSITIVE (*)    All other components within normal limits  GC/CHLAMYDIA PROBE AMP  URINALYSIS, ROUTINE W REFLEX MICROSCOPIC  RPR  HIV ANTIBODY (ROUTINE TESTING)  ABO/RH    Imaging Review No results found.   EKG Interpretation None      MDM  Will obtain US to rule out ectopic  Final diagnoses:  Pregnancy  Round ligament pain         Arman FilterGail K Elisabet Gutzmer, NP 09/16/13 2037

## 2013-09-14 NOTE — ED Provider Notes (Signed)
Discussed case with Earley FavorGail Schulz, NP. Transfer of care to Earley FavorGail Schulz, NP at change in shift.   Lanae Boasturea Knick is a 28 y/o F with PMHx of asthma, allergies, and depression presenting to the ED with pelvic pain for the past 2 weeks with worsening the other day with radiation down to her buttocks. Patient was just diagnosed with being pregnant - approximately [redacted] weeks gestation.  Physical and pelvic exam unremarkable - as per previous provider.   Plan: Ultrasound to rule out ectopic, plan for discharge with outpatient follow-up.  Results for orders placed during the hospital encounter of 09/14/13  WET PREP, GENITAL      Result Value Ref Range   Yeast Wet Prep HPF POC NONE SEEN  NONE SEEN   Trich, Wet Prep NONE SEEN  NONE SEEN   Clue Cells Wet Prep HPF POC FEW (*) NONE SEEN   WBC, Wet Prep HPF POC FEW (*) NONE SEEN  URINALYSIS, ROUTINE W REFLEX MICROSCOPIC      Result Value Ref Range   Color, Urine YELLOW  YELLOW   APPearance CLEAR  CLEAR   Specific Gravity, Urine 1.008  1.005 - 1.030   pH 6.0  5.0 - 8.0   Glucose, UA NEGATIVE  NEGATIVE mg/dL   Hgb urine dipstick NEGATIVE  NEGATIVE   Bilirubin Urine NEGATIVE  NEGATIVE   Ketones, ur NEGATIVE  NEGATIVE mg/dL   Protein, ur NEGATIVE  NEGATIVE mg/dL   Urobilinogen, UA 0.2  0.0 - 1.0 mg/dL   Nitrite NEGATIVE  NEGATIVE   Leukocytes, UA NEGATIVE  NEGATIVE  PREGNANCY, URINE      Result Value Ref Range   Preg Test, Ur POSITIVE (*) NEGATIVE  ABO/RH      Result Value Ref Range   ABO/RH(D) O POS     [place] Ob Comp Less 14 Wks  09/14/2013   CLINICAL DATA:  Abdominal pain  EXAM: OBSTETRIC <14 WK US AND TRANSVAGINAL OB US  TECHNIQUE: Both transabdominal and transvaginal ultrasound examinations were performed for complete evaluation of the gestation as well as the maternal uterus, adnexal regions, and pelvic cul-de-sac. Transvaginal technique was performed to assess early pregnancy.  COMPARISON:  None.  FINDINGS: Intrauterine gestational sac:  Visualized/normal in shape.  Yolk sac:  Present.  Embryo:  Present.  Cardiac Activity: Present.  Heart Rate:  105 bpm  CRL:   3.3  mm   6 w 0d                  US EDC: 05/10/2014  Maternal uterus/adnexae: Normal bilateral ovaries. No adnexal mass. No pelvic free fluid.  IMPRESSION: Single live intrauterine pregnancy dating 6 weeks 0 days with an ultrasound EDC of 05/10/2014.   Electronically Signed   By: Elige KoHetal  Patel   On: 09/14/2013 06:27   [place] Ob Transvaginal  09/14/2013   CLINICAL DATA:  Abdominal pain  EXAM: OBSTETRIC <14 WK US AND TRANSVAGINAL OB US  TECHNIQUE: Both transabdominal and transvaginal ultrasound examinations were performed for complete evaluation of the gestation as well as the maternal uterus, adnexal regions, and pelvic cul-de-sac. Transvaginal technique was performed to assess early pregnancy.  COMPARISON:  None.  FINDINGS: Intrauterine gestational sac: Visualized/normal in shape.  Yolk sac:  Present.  Embryo:  Present.  Cardiac Activity: Present.  Heart Rate:  105 bpm  CRL:   3.3  mm   6 w 0d                  US EDC:  05/10/2014  Maternal uterus/adnexae: Normal bilateral ovaries. No adnexal mass. No pelvic free fluid.  IMPRESSION: Single live intrauterine pregnancy dating 6 weeks 0 days with an ultrasound EDC of 05/10/2014.   Electronically Signed   By: Elige KoHetal  Patel   On: 09/14/2013 06:27   Ultrasound noted single live intrauterine pregnancy dating 6 weeks 1 days. Negative findings of ectopic pregnancy.   7:14 AM This provider re-assessed the patient. Patient reported that she is feeling well, but having a little headache. Reported that she has been having this issue for the past couple of months, but has been looking into it and being followed up by her PCP regarding the headaches - worsening within the past 2 weeks, throbbing headaches with photophobia. Cannot take NSAIDs due to anaphylactic reaction. Pelvic pain intermittent for the past 1-2 months - just diagnosed with being [redacted] weeks  pregnant, 09/11/2013. Reported that this is her first child and LMP 07/25/2013.  Patient sitting comfortable in bed. Heart rate and rhythm normal. Lungs clear to auscultation to upper and lower lobes bilaterally. Radial and DP pulses 2+ bilaterally. Obese. BS normoactive in all 4 quadrants with negative pain upon palpation to the abdomen - soft - negative peritoneal signs - negative rigidity - nonsurgical abdomen.   Diagnoses that have been ruled out:  None  Diagnoses that are still under consideration:  None  Final diagnoses:  Pregnancy  Round ligament pain    Medications  ondansetron (ZOFRAN-ODT) disintegrating tablet 4 mg (4 mg Oral Given 09/14/13 0339)  acetaminophen (TYLENOL) tablet 325 mg (325 mg Oral Given 09/14/13 0721)   Filed Vitals:   09/14/13 0256 09/14/13 0704 09/14/13 0801  BP: 120/55 99/48 115/57  Pulse: 84 78 77  Temp: 97.9 F (36.6 C)    TempSrc: Oral    Resp: 20 16 18   Height: 5\' 4"  (1.626 m)    Weight: 273 lb (123.832 kg)    SpO2:  98% 98%   Negative findings of ectopic pregnancy. Doubt TOA. Doubt acute abdominal processes. Suspicion discomfort secondary to new pregnancy. Patient stable, afebrile. Tylenol given in the ED setting for headache. Patient appears well. Discussed imaging and labs with patient. Discussed with patient that most of her symptoms are due to her pregnancy, ligament syndrome. Discussed with patient to rest and stay hydrated. Patient does not have an OBGYN, will discharge patient with referral sheet. Discussed with patient to continue prenatal vitamins. Discussed with patient to avoid any physical or strenuous activity. Discussed with patient to closely monitor symptoms and if symptoms are to worsen or change to report back to the ED - strict return instructions given.  Patient agreed to plan of care, understood, all questions answered.   Raymon MuttonMarissa Lakiesha Ralphs, PA-C 09/14/13 1723

## 2013-09-14 NOTE — Discharge Instructions (Signed)
Please call your doctor for a followup appointment within 24-48 hours. When you talk to your doctor please let them know that you were seen in the emergency department and have them acquire all of your records so that they can discuss the findings with you and formulate a treatment plan to fully care for your new and ongoing problems. Please call and set-up an appointment without primary care provider within the next 24-48 hours to be re-assessed Please rest and stay hydrated Please continue prenatal vitamins Please no more than 1,200 mg of Tylenol per day - descreased amount in pregnancy Please call and set-up an appointment with [facility] to be re-assessed  Please continue to monitor symptoms closely and if symptoms are to worsen or change (fever greater than 101, nausea, vomiting, diarrhea, chest pain, shortness of breath, difficulty breathing, numbness, tingling, blood in the stools, black for stools, vaginal spotting, vaginal pain, back pain) please report back to the ED immediately  Pregnancy If you are planning on getting pregnant, it is a good idea to make a preconception appointment with your caregiver to discuss having a healthy lifestyle before getting pregnant. This includes diet, weight, exercise, taking prenatal vitamins (especially folic acid, which helps prevent brain and spinal cord defects), avoiding alcohol, smoking and illegal drugs, medical problems (diabetes, convulsions), family history of genetic problems, working conditions, and immunizations. It is better to have knowledge of these things and do something about them before getting pregnant. During your pregnancy, it is important to follow certain guidelines in order to have a healthy baby. It is very important to get good prenatal care and follow your caregiver's instructions. Prenatal care includes all the medical care you receive before your baby's birth. This helps to prevent problems during the pregnancy and  childbirth. HOME CARE INSTRUCTIONS   Start your prenatal visits by the 12th week of pregnancy or earlier, if possible. At first, appointments are usually scheduled monthly. They become more frequent in the last 2 months before delivery. It is important that you keep your caregiver's appointments and follow your caregiver's instructions regarding medication use, exercise, and diet.  During pregnancy, you are providing food for you and your baby. Eat a regular, well-balanced diet. Choose foods such as meat, fish, milk and other dairy products, vegetables, fruits, whole-grain breads and cereals. Your caregiver will inform you of the ideal weight gain depending on your current height and weight. Drink lots of liquids. Try to drink 8 glasses of water a day.  Alcohol is associated with a number of birth defects including fetal alcohol syndrome. It is best to avoid alcohol completely. Smoking will cause low birth rate and prematurity. Use of alcohol and nicotine during your pregnancy also increases the chances that your child will be chemically dependent later in their life and may contribute to SIDS (Sudden Infant Death Syndrome).  Do not use illegal drugs.  Only take prescription or over-the-counter medications that are recommended by your caregiver. Other medications can cause genetic and physical problems in the baby.  Morning sickness can often be helped by keeping soda crackers at the bedside. Eat a few before getting up in the morning.  A sexual relationship may be continued until near the end of pregnancy if there are no other problems such as early (premature) leaking of amniotic fluid from the membranes, vaginal bleeding, painful intercourse or belly (abdominal) pain.  Exercise regularly. Check with your caregiver if you are unsure of the safety of some of your exercises.  Do  not use hot tubs, steam rooms or saunas. These increase the risk of fainting and hurting yourself and the baby. Swimming  is OK for exercise. Get plenty of rest, including afternoon naps when possible, especially in the third trimester.  Avoid toxic odors and chemicals.  Do not wear high heels. They may cause you to lose your balance and fall.  Do not lift over 5 pounds. If you do lift anything, lift with your legs and thighs, not your back.  Avoid long trips, especially in the third trimester.  If you have to travel out of the city or state, take a copy of your medical records with you. SEEK IMMEDIATE MEDICAL CARE IF:   You develop an unexplained oral temperature above 102 F (38.9 C), or as your caregiver suggests.  You have leaking of fluid from the vagina. If leaking membranes are suspected, take your temperature and inform your caregiver of this when you call.  There is vaginal spotting or bleeding. Notify your caregiver of the amount and how many pads are used.  You continue to feel sick to your stomach (nauseous) and have no relief from remedies suggested, or you throw up (vomit) blood or coffee ground like materials.  You develop upper abdominal pain.  You have round ligament discomfort in the lower abdominal area. This still must be evaluated by your caregiver.  You feel contractions of the uterus.  You do not feel the baby move, or there is less movement than before.  You have painful urination.  You have abnormal vaginal discharge.  You have persistent diarrhea.  You get a severe headache.  You have problems with your vision.  You develop muscle weakness.  You feel dizzy and faint.  You develop shortness of breath.  You develop chest pain.  You have back pain that travels down to your leg and feet.  You feel irregular or a very fast heartbeat.  You develop excessive weight gain in a short period of time (5 pounds in 3 to 5 days).  You are involved in a domestic violence situation. Document Released: 05/17/2005 Document Revised: 11/16/2011 Document Reviewed:  11/08/2008 [facility] Patient Information 2014 [place], [place].   Emergency Department Resource Guide 1) Find a Doctor and Pay Out of Pocket Although you won't have to find out who is covered by your insurance plan, it is a good idea to ask around and get recommendations. You will then need to call the office and see if the doctor you have chosen will accept you as a new patient and what types of options they offer for patients who are self-pay. Some doctors offer discounts or will set up payment plans for their patients who do not have insurance, but you will need to ask so you aren't surprised when you get to your appointment.  2) Contact Your Local Health Department Not all health departments have doctors that can see patients for sick visits, but many do, so it is worth a call to see if yours does. If you don't know where your local health department is, you can check in your phone book. The CDC also has a tool to help you locate your state's health department, and many state websites also have listings of all of their local health departments.  3) Find a Walk-in Clinic If your illness is not likely to be very severe or complicated, you may want to try a walk in clinic. These are popping up all over the country in pharmacies, drugstores, and  shopping centers. They're usually staffed by nurse practitioners or physician assistants that have been trained to treat common illnesses and complaints. They're usually fairly quick and inexpensive. However, if you have serious medical issues or chronic medical problems, these are probably not your best option.  No Primary Care Doctor: - Call Health Connect at  [phone] - they can help you locate a primary care doctor that  accepts your insurance, provides certain services, etc. - Physician Referral Service- [phone]  Chronic Pain Problems: Organization         Address  Phone   Notes  [facility]  [phone] Patients need to  be referred by their primary care doctor.   Medication Assistance: Organization         Address  Phone   Notes  [facility] Medication [facility] [address] [place], [place] [address] [phone] --Must be a resident of [facility] -- Must have NO insurance coverage whatsoever (no Medicaid/ Medicare, etc.) -- The pt. MUST have a primary care doctor that directs their care regularly and follows them in the community   [facility]ist  [phone]   [employer]  [phone]    Agencies that provide inexpensive medical care: Organization         Address  Phone   Notes  [place]  [phone]   [facility]    [phone]   [facility] [place], [place] [address] [phone]   [place] [address] [place] [phone]   Planned Parenthood    [phone]   [facility]    [phone]   [place] and [address], [place] Phone:  [phone], Fax:  [phone] Hours of Operation:  9 am - 6 pm, M-F.  Also accepts Medicaid/Medicare and self-pay.  [facility] for [address], [address], [place] Phone: [phone], Fax: [phone]. Hours of Operation:  8:30 am - 5:30 pm, M-F.  Also accepts Medicaid and self-pay.  [facility] [address], [place] Phone: [phone]   [place], [place], [place] [phone], Ext. [phone] Mondays & Thursdays: 7-9 AM.  First 15 patients are seen on a first come, first serve basis.    [place] Providers:  Organization         Address  Phone   Notes  [facility] [address], [place] [phone] Also accepts self-pay patients.  [facility] [address], [place]  [phone]   [place], [address], [place] [phone]   [facility] [address] [place] [phone]   Lucianne Lei [address], [place]   [phone] Only accepts [place] Access [place] patients after they have their name applied to their card.   Self-Pay (no insurance) in [facility]:  Organization         Address  Phone   Notes  Sickle Cell Patients, [facility] [place] [phone]   [facility] [place] [phone]   [facility]gent [place]  [place] [address] [place], [place] [phone]  [facility]/Dr. Osei-Bonsu  [address], [place] or [address], [place] [phone] Phone number for both [place] and [place] locations is the same.  Urgent Medical and [facility] [address] [place] [phone]   [facility] [address], [place] or [address] [phone]   [facility] [address] [place] [phone], phone; [phone], fax Sees patients 1st and 3rd Saturday of every month.  Must not qualify for public or private insurance (i.e. Medicaid, Medicare, [place] Health Choice, Veterans' Benefits)  Household income should be no more than 200% of the poverty level The clinic cannot treat you if you are pregnant or think you are pregnant  Sexually transmitted diseases are not treated at the clinic.    Dental Care: Organization         Address  Phone  Notes  [facility] Department of [facility] [place] [phone] Accepts children up to age 15 who are enrolled in [place] or [place]; pregnant women with a Medicaid card; and children who have applied for Medicaid or [place] Health Choice, but were declined, whose parents can  pay a reduced fee at time of service.  [facility] Department of [facility]  [address], [place] [phone] Accepts children up to age 9 who are enrolled in [place] or [place]; pregnant women with a Medicaid card; and children who have applied for Medicaid or [place] Health Choice, but were declined, whose parents can pay a reduced fee at time of service.  [place] Adult Dental Access PROGRAM  [place] [phone] Patients are seen by appointment only. Walk-ins are not accepted. [place] will see patients 43 years of age and older. Monday - Tuesday (8am-5pm) Most Wednesdays (8:30-5pm) $30 per visit, cash only  [facility] Adult Dental Access PROGRAM  [address], [facility] [phone] Patients are seen by appointment only. Walk-ins are not accepted. [place] will see patients 46 years of age and older. One Wednesday Evening (Monthly: Volunteer Based).  $30 per visit, cash only  [place]  [phone] for adults; Children under age 23, call Graduate Pediatric Dentistry at [phone]. Children aged 65-14, please call [phone] to request a pediatric application.  Dental services are provided in all areas of dental care including fillings, crowns and bridges, complete and partial dentures, implants, gum treatment, root canals, and extractions. Preventive care is also provided. Treatment is provided to both adults and children. Patients are selected via a lottery and there is often a waiting list.   [facility] [address] [place]  [phone] [email or web address]   [facility] [address] [place], [place] [phone], Ext. [phone] Second and Fourth Thursday of each month, opens at 6:30 AM; Clinic ends at 9 AM.  Patients are seen on a first-come first-served basis, and a limited number are seen during each clinic.   [facility]  [address] Hillard Danker [place], [place] [phone]   Eligibility Requirements You must have lived in [place], [place], or [place] counties for at least the last three months.   You cannot be eligible for state or federal sponsored [employer], including [employer], [place], or [employer].   You generally cannot be eligible for healthcare insurance  through your employer.    How to apply: Eligibility screenings are held every Tuesday and Wednesday afternoon from 1:00 pm until 4:00 pm. You do not need an appointment for the interview!  [facility] [address], [place], [place] [phone]   [facility]  [phone]   [facility]  [phone]   [facility]  [phone]    Behavioral Health Resources in the Community: Intensive Outpatient Programs Organization         Address  Phone  Notes  [facility] [address], [place], [place] [phone]   [facility] [address], [place], [place] [phone]   [facility] [address], [place], [place]  [phone]   [facility] [address],  [place], [place] [phone] or [phone]   Substance Abuse Resources Organization         Address  Phone  Notes  [facility]  [phone]   [facility]  [phone]   [facility]  [phone]   [facility]  [phone]   Residential & Outpatient Substance Abuse Program  [phone]   Psychological Services Organization         Address  Phone  Notes  [facility]  [phone]- [phone]   [facility]  [phone]- [phone]   [facility] [address], [place] [phone] or [phone]    Mobile Crisis Teams Organization         Address  Phone  Notes  [facility], Mobile Crisis Care Unit  [phone]    [facility]  [address] [place], [place] [phone]   Doristine LocksSharon DeEsch [address] [place] [phone]    Self-Help/Support Groups Organization         Address  Phone             Notes  Mental Health Assoc. of  - variety of support groups  [phone] Call for more information  Narcotics Anonymous (NA), [facility] [address], [place]  2 meetings at this location   Statisticianesidential Treatment Programs Organization         Address  Phone  Notes  [facility] [address],    [place]  [phone]   [facility]  [address], [place] [phone], [place], [place] [phone]   [facility] [address], [place] [phone] Admissions: 8am-3pm M-F  [facility] [address]    [place], [place] [phone]   [facility] [address], [place], [place] [phone]   [facility] [address]  [place], [place] [phone]   [facility] - Intensive Outpatient [address], [place], [place] [phone]   [facility] ([facility] [address]  [place], [place] [phone] or [phone]   [facility] ([facility]) [address] [place], [place] [phone] Accepts Medicaid  [facility] [address]  [place] [phone] Substance Abuse/Addiction Treatment   [facility] Resources Organization         Address  Phone  Notes  [facility]  [phone]   Angie FavaJulie Brannon, PhD [address] [place], [place]   [phone] or [phone]   [facility]   [address] [place], [place] [phone]  [facility] [address] [place], [place] [phone] Insurance/Medicaid/sponsorship through [employer] and Families [address] [place], [place] [phone] [place], [place] [phone]    Dr. Adele Schilder  [phone]   [facility] Dept. 1) [address] [place] 2) [place] 3)  [place] [address], [place] [phone]  [phone]   [place] [phone] or [phone] (After Hours)

## 2013-09-14 NOTE — ED Notes (Signed)
Pt states she has been having pelvic pain several weeks worse over the past two weeks  Today she states she developed today and it radiates down her butt  Pt states the pain is worse when she walks  Pt states she also has a migraine and is very nauseated  Pt states she found out the other day that she is [redacted] weeks pregnant by her PCP

## 2013-09-14 NOTE — ED Notes (Signed)
Ultrasound in progress  

## 2013-09-15 LAB — GC/CHLAMYDIA PROBE AMP
CT PROBE, AMP APTIMA: NEGATIVE
GC PROBE AMP APTIMA: NEGATIVE

## 2013-09-20 NOTE — ED Provider Notes (Signed)
Medical screening examination/treatment/procedure(s) were performed by non-physician practitioner and as supervising physician I was immediately available for consultation/collaboration.      Hanley SeamenJohn L Opal Dinning, MD 09/20/13 2239

## 2013-10-01 ENCOUNTER — Encounter (HOSPITAL_COMMUNITY): Payer: Self-pay | Admitting: Emergency Medicine

## 2013-10-01 ENCOUNTER — Emergency Department (HOSPITAL_COMMUNITY)
Admission: EM | Admit: 2013-10-01 | Discharge: 2013-10-01 | Disposition: A | Discharge status: Home / Self Care | Payer: Medicare Other | Attending: Emergency Medicine | Admitting: Emergency Medicine

## 2013-10-01 DIAGNOSIS — O26899 Other specified pregnancy related conditions, unspecified trimester: Secondary | ICD-10-CM

## 2013-10-01 DIAGNOSIS — R1030 Lower abdominal pain, unspecified: Secondary | ICD-10-CM

## 2013-10-01 DIAGNOSIS — O99330 Smoking (tobacco) complicating pregnancy, unspecified trimester: Secondary | ICD-10-CM | POA: Insufficient documentation

## 2013-10-01 DIAGNOSIS — O9989 Other specified diseases and conditions complicating pregnancy, childbirth and the puerperium: Secondary | ICD-10-CM | POA: Insufficient documentation

## 2013-10-01 DIAGNOSIS — Z79899 Other long term (current) drug therapy: Secondary | ICD-10-CM | POA: Insufficient documentation

## 2013-10-01 DIAGNOSIS — J45909 Unspecified asthma, uncomplicated: Secondary | ICD-10-CM | POA: Insufficient documentation

## 2013-10-01 DIAGNOSIS — M545 Low back pain, unspecified: Secondary | ICD-10-CM | POA: Insufficient documentation

## 2013-10-01 DIAGNOSIS — R110 Nausea: Secondary | ICD-10-CM | POA: Insufficient documentation

## 2013-10-01 DIAGNOSIS — M899 Disorder of bone, unspecified: Secondary | ICD-10-CM | POA: Insufficient documentation

## 2013-10-01 DIAGNOSIS — R1084 Generalized abdominal pain: Secondary | ICD-10-CM | POA: Insufficient documentation

## 2013-10-01 DIAGNOSIS — Z8659 Personal history of other mental and behavioral disorders: Secondary | ICD-10-CM | POA: Insufficient documentation

## 2013-10-01 DIAGNOSIS — M259 Joint disorder, unspecified: Secondary | ICD-10-CM | POA: Insufficient documentation

## 2013-10-01 LAB — URINALYSIS, ROUTINE W REFLEX MICROSCOPIC
Bilirubin Urine: NEGATIVE
Glucose, UA: NEGATIVE mg/dL
Hgb urine dipstick: NEGATIVE
Ketones, ur: NEGATIVE mg/dL
Leukocytes, UA: NEGATIVE
Nitrite: NEGATIVE
Protein, ur: NEGATIVE mg/dL
Specific Gravity, Urine: 1.023 (ref 1.005–1.030)
Urobilinogen, UA: 1 mg/dL (ref 0.0–1.0)
pH: 6.5 (ref 5.0–8.0)

## 2013-10-01 LAB — CBC WITH DIFFERENTIAL/PLATELET
Basophils Absolute: 0.1 10*3/uL (ref 0.0–0.1)
Basophils Relative: 1 % (ref 0–1)
Eosinophils Absolute: 0.4 10*3/uL (ref 0.0–0.7)
Eosinophils Relative: 4 % (ref 0–5)
HCT: 35 % — ABNORMAL LOW (ref 36.0–46.0)
Hemoglobin: 11.8 g/dL — ABNORMAL LOW (ref 12.0–15.0)
Lymphocytes Relative: 23 % (ref 12–46)
Lymphs Abs: 2.3 10*3/uL (ref 0.7–4.0)
MCH: 28.4 pg (ref 26.0–34.0)
MCHC: 33.7 g/dL (ref 30.0–36.0)
MCV: 84.3 fL (ref 78.0–100.0)
Monocytes Absolute: 0.7 10*3/uL (ref 0.1–1.0)
Monocytes Relative: 7 % (ref 3–12)
Neutro Abs: 6.5 10*3/uL (ref 1.7–7.7)
Neutrophils Relative %: 65 % (ref 43–77)
Platelets: 341 10*3/uL (ref 150–400)
RBC: 4.15 MIL/uL (ref 3.87–5.11)
RDW: 13.3 % (ref 11.5–15.5)
WBC: 10 10*3/uL (ref 4.0–10.5)

## 2013-10-01 LAB — BASIC METABOLIC PANEL
BUN: 9 mg/dL (ref 6–23)
CO2: 24 mEq/L (ref 19–32)
Calcium: 9 mg/dL (ref 8.4–10.5)
Chloride: 101 mEq/L (ref 96–112)
Creatinine, Ser: 0.54 mg/dL (ref 0.50–1.10)
GFR calc Af Amer: >90 mL/min (ref >90)
GFR calc non Af Amer: >90 mL/min (ref >90)
Glucose, Bld: 87 mg/dL (ref 70–99)
Potassium: 4 mEq/L (ref 3.7–5.3)
Sodium: 136 mEq/L — ABNORMAL LOW (ref 137–147)

## 2013-10-01 LAB — WET PREP, GENITAL
Clue Cells Wet Prep HPF POC: NONE SEEN
Trich, Wet Prep: NONE SEEN
WBC, Wet Prep HPF POC: NONE SEEN
Yeast Wet Prep HPF POC: NONE SEEN

## 2013-10-01 LAB — GC/CHLAMYDIA PROBE AMP
CT Probe RNA: NEGATIVE
GC Probe RNA: NEGATIVE

## 2013-10-01 MED ORDER — ONDANSETRON 4 MG PO TBDP
4.0000 mg | ORAL_TABLET | Freq: Once | ORAL | Status: AC
  Administered 2013-10-01: 4 mg via ORAL
  Filled 2013-10-01: qty 1

## 2013-10-01 MED ORDER — ACETAMINOPHEN 325 MG PO TABS
650.0000 mg | ORAL_TABLET | Freq: Once | ORAL | Status: AC
  Administered 2013-10-01: 650 mg via ORAL
  Filled 2013-10-01: qty 2

## 2013-10-01 NOTE — ED Provider Notes (Signed)
CSN: [id number]     Arrival date & time 10/01/13  0557 History   First MD Initiated Contact with Patient 10/01/13 [phone]     Chief Complaint  Patient presents with  . Abdominal Pain     (Consider location/radiation/quality/duration/timing/severity/associated sxs/prior Treatment) HPI Pt is a 28yo female, [redacted] weeks pregnant c/o lower abdominal pain that is cramping and occasionally sharp.  Pain is 8/10, waxes and wanes, woke pt from her sleep about 45min PTA.  Pt states she has had similar pain since week 6 of pregnancy. This is pt's first pregnancy. IUP confirmed by pelvic US on 09/14/13. Pt reports having her first OB/GYN f/u tomorrow.  Reports nausea but denies vomiting or diarrhea. Denies urinary or vaginal symptoms including vaginal bleeding.  Denies hx of abdominal surgeries or renal stones. She has not taken anything for pain PTA.    Past Medical History  Diagnosis Date  . Asthma   . Allergy   . Depression    Past Surgical History  Procedure Laterality Date  . Breast surgery     Family History  Problem Relation Age of Onset  . Bipolar disorder Mother   . Cancer Father   . Sickle cell anemia Brother   . Breast cancer Maternal Grandmother   . Diabetes Maternal Grandmother   . Diabetes Maternal Grandfather    History  Substance Use Topics  . Smoking status: Current Every Day Smoker -- 0.50 packs/day for 8 years    Types: Cigarettes  . Smokeless tobacco: Not on file  . Alcohol Use: Yes     Comment: social drinker   OB History   Grav Para Term Preterm Abortions TAB SAB Ect Mult Living   1              Review of Systems  Constitutional: Negative for fever and chills.  Respiratory: Negative for shortness of breath.   Cardiovascular: Negative for chest pain and palpitations.  Gastrointestinal: Positive for nausea and abdominal pain. Negative for vomiting, diarrhea, constipation and blood in stool.  Genitourinary: Positive for flank pain ( bilateral) and pelvic pain. Negative  for dysuria, urgency, frequency, hematuria, decreased urine volume, vaginal bleeding, vaginal discharge, genital sores, vaginal pain and menstrual problem.  Musculoskeletal: Positive for back pain ( lower).  All other systems reviewed and are negative.     Allergies  Nsaids and Soybean-containing drug products  Home Medications   Prior to Admission medications   Medication Sig Start Date End Date Taking? Authorizing Provider  acetaminophen (TYLENOL) 500 MG tablet Take 1,000 mg by mouth every 6 (six) hours as needed.    Historical Provider, MD  albuterol (PROVENTIL HFA;VENTOLIN HFA) 108 (90 BASE) MCG/ACT inhaler Inhale 2 puffs into the lungs every 6 (six) hours as needed for wheezing.    Historical Provider, MD  albuterol (PROVENTIL) (2.5 MG/3ML) 0.083% nebulizer solution Take 2.5 mg by nebulization every 6 (six) hours as needed for wheezing or shortness of breath.    Historical Provider, MD  diphenhydrAMINE (BENADRYL) 12.5 MG/5ML elixir Take 25 mg by mouth 4 (four) times daily as needed.    Historical Provider, MD  EPINEPHrine (EPIPEN JR) 0.15 MG/0.3ML injection Inject 0.3 mLs (0.15 mg total) into the muscle as needed. 12/29/11   Shaaron AdlerStephanie Justice Wolfe, PA-C  ondansetron (ZOFRAN) 4 MG tablet Take 1 tablet (4 mg total) by mouth every 8 (eight) hours as needed for nausea or vomiting. 09/14/13   Marissa Sciacca, PA-C  Prenatal Vit-Fe Fumarate-FA (PRENATAL MULTIVITAMIN) TABS tablet Take 1  tablet by mouth daily at 12 noon.    Historical Provider, MD   BP 113/65  Pulse 62  Temp(Src) 97.7 F (36.5 C) (Oral)  Resp 16  SpO2 100%  LMP 07/25/2013 Physical Exam  Nursing note and vitals reviewed. Constitutional: She appears well-developed and well-nourished. No distress.  Pt sitting comfortably on side of exam bed, NAD.  HENT:  Head: Normocephalic and atraumatic.  Eyes: Conjunctivae are normal. No scleral icterus.  Neck: Normal range of motion.  Cardiovascular: Normal rate, regular rhythm and  normal heart sounds.   Pulmonary/Chest: Effort normal and breath sounds normal. No respiratory distress. She has no wheezes. She has no rales. She exhibits no tenderness.  Abdominal: Soft. Bowel sounds are normal. She exhibits no distension and no mass. There is tenderness. There is no rebound and no guarding.  Obese abdomen, soft, tender across lower abdomen. No rebound, guarding or masses  Genitourinary:  Chaperoned exam. No vaginal discharge or bleeding. Cervical os closed. Lower pelvic tenderness with no CMT, adnexal tenderness or masses.  Musculoskeletal: Normal range of motion.  Neurological: She is alert.  Skin: Skin is warm and dry. She is not diaphoretic.    ED Course  Procedures (including critical care time) Labs Review Labs Reviewed  CBC WITH DIFFERENTIAL - Abnormal; Notable for the following:    Hemoglobin 11.8 (*)    HCT 35.0 (*)    All other components within normal limits  BASIC METABOLIC PANEL - Abnormal; Notable for the following:    Sodium 136 (*)    All other components within normal limits  URINALYSIS, ROUTINE W REFLEX MICROSCOPIC - Abnormal; Notable for the following:    APPearance CLOUDY (*)    All other components within normal limits  WET PREP, GENITAL  GC/CHLAMYDIA PROBE AMP    Imaging Review No results found.   EKG Interpretation None      MDM   Final diagnoses:  Pregnancy with abdominal pain of lower quadrant, antepartum    Pt is [redacted]wks pregnant with confirmed IUP at 6wks via pelvic U/S on 09/14/13.  On exam, pt appears well, afebrile, she is tender in lower abdomen but no rebound or guarding. Not concerned for surgical abdomen.  Pelvic exam: no vaginal bleeding, cervical os closed. Similar to previous pain since 6 week mark of pt's pregnancy.  Labs: unremarkable, no evidence of renal stones or UTI.  Discussed pt with attending, do not believe repeat imaging needed at this time. Advised to f/u as scheduled with her OB/GYN. Return precautions  provided. Pt verbalized understanding and agreement with tx plan.     Junius FinnerErin O'Malley, PA-C 10/01/13 [phone]

## 2013-10-01 NOTE — ED Notes (Signed)
Pt arrived to the ED with a complaint of abdominal pain located on the lower flanks radiating to the back and lower abdomen areas.  Pt is eight (8) weeks pregnant.  Pt began experiencing pain approx. 45 minutes ago.  On call OB/GYN recommended evaluation.

## 2013-10-01 NOTE — Discharge Instructions (Signed)
You may take tylenol as needed for your abdominal pain while pregnant.  Be sure to take all other medication including anti-nausea and prenatal vitamins as prescribed by your OB/GYN.    Abdominal Pain During Pregnancy Belly (abdominal) pain is common during pregnancy. Most of the time, it is not a serious problem. Other times, it can be a sign that something is wrong with the pregnancy. Always tell your doctor if you have belly pain. HOME CARE Monitor your belly pain for any changes. The following actions may help you feel better:  Do not have sex (intercourse) or put anything in your vagina until you feel better.  Rest until your pain stops.  Drink clear fluids if you feel sick to your stomach (nauseous). Do not eat solid food until you feel better.  Only take medicine as told by your doctor.  Keep all doctor visits as told. GET HELP RIGHT AWAY IF:   You are bleeding, leaking fluid, or pieces of tissue come out of your vagina.  You have more pain or cramping.  You keep throwing up (vomiting).  You have pain when you pee (urinate) or have blood in your pee.  You have a fever.  You do not feel your baby moving as much.  You feel very weak or feel like passing out.  You have trouble breathing, with or without belly pain.  You have a very bad headache and belly pain.  You have fluid leaking from your vagina and belly pain.  You keep having watery poop (diarrhea).  Your belly pain does not go away after resting, or the pain gets worse. MAKE SURE YOU:   Understand these instructions.  Will watch your condition.  Will get help right away if you are not doing well or get worse. Document Released: 05/05/2009 Document Revised: 01/17/2013 Document Reviewed: 12/14/2012 [facility] Patient Information 2014 [employer], [place].

## 2013-10-03 LAB — OB RESULTS CONSOLE GC/CHLAMYDIA
Chlamydia: NEGATIVE
Gonorrhea: NEGATIVE

## 2013-10-09 NOTE — ED Provider Notes (Signed)
Medical screening examination/treatment/procedure(s) were performed by non-physician practitioner and as supervising physician I was immediately available for consultation/collaboration.   EKG Interpretation None        Loren Raceravid Yannely Kintzel, MD 10/09/13 2310

## 2013-11-01 LAB — OB RESULTS CONSOLE RUBELLA ANTIBODY, IGM: Rubella: IMMUNE

## 2013-11-01 LAB — OB RESULTS CONSOLE HEPATITIS B SURFACE ANTIGEN: Hepatitis B Surface Ag: NEGATIVE

## 2013-11-01 LAB — OB RESULTS CONSOLE ANTIBODY SCREEN: Antibody Screen: NEGATIVE

## 2013-11-22 ENCOUNTER — Encounter (HOSPITAL_COMMUNITY): Payer: Self-pay | Admitting: Emergency Medicine

## 2013-11-22 ENCOUNTER — Emergency Department (HOSPITAL_COMMUNITY)
Admission: EM | Admit: 2013-11-22 | Discharge: 2013-11-22 | Disposition: A | Discharge status: Home / Self Care | Payer: Medicare Other | Attending: Emergency Medicine | Admitting: Emergency Medicine

## 2013-11-22 DIAGNOSIS — F17200 Nicotine dependence, unspecified, uncomplicated: Secondary | ICD-10-CM | POA: Insufficient documentation

## 2013-11-22 DIAGNOSIS — J45901 Unspecified asthma with (acute) exacerbation: Secondary | ICD-10-CM | POA: Diagnosis not present

## 2013-11-22 DIAGNOSIS — F3289 Other specified depressive episodes: Secondary | ICD-10-CM | POA: Diagnosis not present

## 2013-11-22 DIAGNOSIS — Z79899 Other long term (current) drug therapy: Secondary | ICD-10-CM | POA: Insufficient documentation

## 2013-11-22 DIAGNOSIS — F329 Major depressive disorder, single episode, unspecified: Secondary | ICD-10-CM | POA: Diagnosis not present

## 2013-11-22 DIAGNOSIS — J45909 Unspecified asthma, uncomplicated: Secondary | ICD-10-CM | POA: Diagnosis present

## 2013-11-22 MED ORDER — ALBUTEROL SULFATE (2.5 MG/3ML) 0.083% IN NEBU
5.0000 mg | INHALATION_SOLUTION | Freq: Once | RESPIRATORY_TRACT | Status: AC
  Administered 2013-11-22: 5 mg via RESPIRATORY_TRACT
  Filled 2013-11-22: qty 6

## 2013-11-22 MED ORDER — LORAZEPAM 0.5 MG PO TABS
0.5000 mg | ORAL_TABLET | Freq: Once | ORAL | Status: AC
  Administered 2013-11-22: 0.5 mg via ORAL
  Filled 2013-11-22: qty 1

## 2013-11-22 MED ORDER — PANTOPRAZOLE SODIUM 40 MG PO TBEC
40.0000 mg | DELAYED_RELEASE_TABLET | Freq: Once | ORAL | Status: AC
  Administered 2013-11-22: 40 mg via ORAL
  Filled 2013-11-22: qty 1

## 2013-11-22 MED ORDER — ALBUTEROL SULFATE HFA 108 (90 BASE) MCG/ACT IN AERS
2.0000 | INHALATION_SPRAY | RESPIRATORY_TRACT | Status: DC | PRN
  Filled 2013-11-22: qty 6.7

## 2013-11-22 MED ORDER — IPRATROPIUM BROMIDE 0.02 % IN SOLN
0.5000 mg | Freq: Once | RESPIRATORY_TRACT | Status: AC
  Administered 2013-11-22: 0.5 mg via RESPIRATORY_TRACT
  Filled 2013-11-22: qty 2.5

## 2013-11-22 MED ORDER — ALUM & MAG HYDROXIDE-SIMETH 200-200-20 MG/5ML PO SUSP
15.0000 mL | Freq: Once | ORAL | Status: AC
Start: 2013-11-22 — End: 2013-11-22
  Administered 2013-11-22: 15 mL via ORAL
  Filled 2013-11-22: qty 30

## 2013-11-22 MED ORDER — LIDOCAINE VISCOUS 2 % MT SOLN
15.0000 mL | Freq: Once | OROMUCOSAL | Status: AC
  Administered 2013-11-22: 15 mL via OROMUCOSAL
  Filled 2013-11-22: qty 15

## 2013-11-22 MED ORDER — ACETAMINOPHEN 325 MG PO TABS
650.0000 mg | ORAL_TABLET | Freq: Once | ORAL | Status: AC
  Administered 2013-11-22: 650 mg via ORAL
  Filled 2013-11-22: qty 2

## 2013-11-22 MED ORDER — PREDNISONE 20 MG PO TABS
40.0000 mg | ORAL_TABLET | Freq: Once | ORAL | Status: AC
  Administered 2013-11-22: 40 mg via ORAL
  Filled 2013-11-22: qty 2

## 2013-11-22 MED ORDER — IPRATROPIUM-ALBUTEROL 0.5-2.5 (3) MG/3ML IN SOLN
3.0000 mL | Freq: Once | RESPIRATORY_TRACT | Status: DC
  Filled 2013-11-22: qty 3

## 2013-11-22 NOTE — ED Notes (Signed)
PA Greene at bedside. 

## 2013-11-22 NOTE — ED Notes (Signed)
RT called for duo neb tx 

## 2013-11-22 NOTE — ED Notes (Signed)
Per pt, has hx of asthma.  Took albuterol treatment at home.  Went to sleep.  When she woke up, having difficulty breathing again.  Pt states MD told her not to do albuterol and so she did not want to do repeat dosing.  Pt is [redacted] weeks pregnant.

## 2013-11-22 NOTE — Discharge Instructions (Signed)

## 2013-11-22 NOTE — ED Notes (Signed)
MD at bedside. Dr. Bednar at bedside.  

## 2013-11-22 NOTE — ED Provider Notes (Signed)
Medical screening examination/treatment/procedure(s) were conducted as a shared visit with non-physician practitioner(s) and myself.  I personally evaluated the patient during the encounter.   EKG Interpretation   Date/Time:  Thursday November 22 2013 15:51:38 EDT Ventricular Rate:  88 PR Interval:  145 QRS Duration: 93 QT Interval:  343 QTC Calculation: 415 R Axis:   67 Text Interpretation:  Sinus rhythm Baseline wander in lead(s) V3 V4 V5 V6  Otherwise no significant change 2013 Confirmed by GOLDSTON  MD, SCOTT  ([id number]) on 11/22/2013 3:53:39 PM     Diffuse mild wheezing with reproducible chest wall tenderness clinically suspect exacerbation of asthma; doubt PE/ACS.  Hurman HornJohn M Bednar, MD 11/25/13 2147

## 2013-11-22 NOTE — ED Provider Notes (Signed)
CSN: [id number]     Arrival date & time 11/22/13  1534 History   First MD Initiated Contact with Patient 11/22/13 1605     Chief Complaint  Patient presents with  . Asthma     (Consider location/radiation/quality/duration/timing/severity/associated sxs/prior Treatment) HPI  The patient to the ER with complaints of upper chest pains. She is in the first week of her second trimester and feels that she is not supposed to be using albuterol for more than one treatment. She tried using one treatment but the symptoms did not go away. She does not feel short of breath and denies wheezing. She has a history of acid reflux and is unsure if these symptoms are similar, she feels as though she has been following the pregnancy diet given to her. She does not smoke or due illicit drugs. Denies drinking alcohol. Denies back pain, abdominal pains, vaginal discharge, vaginal bleeding. Pt does not appear in distress and has normal vss. 02 is 99% on room air.  Past Medical History  Diagnosis Date  . Asthma   . Allergy   . Depression    Past Surgical History  Procedure Laterality Date  . Breast surgery     Family History  Problem Relation Age of Onset  . Bipolar disorder Mother   . Cancer Father   . Sickle cell anemia Brother   . Breast cancer Maternal Grandmother   . Diabetes Maternal Grandmother   . Diabetes Maternal Grandfather    History  Substance Use Topics  . Smoking status: Current Every Day Smoker -- 0.50 packs/day for 8 years    Types: Cigarettes  . Smokeless tobacco: Not on file  . Alcohol Use: Yes     Comment: social drinker   OB History   Grav Para Term Preterm Abortions TAB SAB Ect Mult Living   1              Review of Systems   Review of Systems  Gen: no weight loss, fevers, chills, night sweats  Eyes: no discharge or drainage, no occular pain or visual changes  Nose: no epistaxis or rhinorrhea  Mouth: no dental pain, no sore throat  Neck: no neck pain  Lungs:No  wheezing, coughing or hemoptysis CV: no chest pain, palpitations, dependent edema or orthopnea  Abd: no abdominal pain, nausea, vomiting, diarrhea, + acid reflux GU: no dysuria or gross hematuria  MSK:  No muscle weakness or pain Neuro: no headache, no focal neurologic deficits  Skin: no rash or wounds Psyche: no complaints    Allergies  Nsaids and Soybean-containing drug products  Home Medications   Prior to Admission medications   Medication Sig Start Date End Date Taking? Authorizing Provider  acetaminophen (TYLENOL) 500 MG tablet Take 1,000 mg by mouth every 6 (six) hours as needed (for pain.).    Yes Historical Provider, MD  albuterol (PROVENTIL HFA;VENTOLIN HFA) 108 (90 BASE) MCG/ACT inhaler Inhale 2 puffs into the lungs every 6 (six) hours as needed for wheezing.   Yes Historical Provider, MD  albuterol (PROVENTIL) (2.5 MG/3ML) 0.083% nebulizer solution Take 2.5 mg by nebulization every 6 (six) hours as needed for wheezing or shortness of breath.   Yes Historical Provider, MD  loratadine (CLARITIN) 10 MG tablet Take 10 mg by mouth daily as needed for allergies.   Yes Historical Provider, MD  Prenatal Vit-Fe Fumarate-FA (PRENATAL MULTIVITAMIN) TABS tablet Take 1 tablet by mouth daily at 12 noon.   Yes Historical Provider, MD   BP 139/61  Pulse 64  Temp(Src) 98.3 F (36.8 C) (Oral)  Resp 18  SpO2 96%  LMP 07/25/2013 Physical Exam  Nursing note and vitals reviewed. Constitutional: She appears well-developed and well-nourished. No distress.  HENT:  Head: Normocephalic and atraumatic.  Eyes: Pupils are equal, round, and reactive to light.  Neck: Normal range of motion. Neck supple.  Cardiovascular: Normal rate and regular rhythm.   Pulmonary/Chest: Effort normal and breath sounds normal. No respiratory distress. She has no decreased breath sounds. She has no wheezes. She has no rales. She exhibits no tenderness, no bony tenderness, no laceration and no retraction.   Abdominal: Soft. Bowel sounds are normal. There is no tenderness.  Neurological: She is alert.  Skin: Skin is warm and dry.    ED Course  Procedures (including critical care time) Labs Review Labs Reviewed - No data to display  Imaging Review No results found.   EKG Interpretation   Date/Time:  Thursday November 22 2013 15:51:38 EDT Ventricular Rate:  88 PR Interval:  145 QRS Duration: 93 QT Interval:  343 QTC Calculation: 415 R Axis:   67 Text Interpretation:  Sinus rhythm Baseline wander in lead(s) V3 V4 V5 V6  Otherwise no significant change 2013 Confirmed by GOLDSTON  MD, SCOTT  ([id number]) on 11/22/2013 3:53:39 PM      MDM   Final diagnoses:  Asthma exacerbation   Medications  lidocaine (XYLOCAINE) 2 % viscous mouth solution 15 mL (15 mLs Mouth/Throat Given 11/22/13 1649)  alum & mag hydroxide-simeth (MAALOX/MYLANTA) 200-200-20 MG/5ML suspension 15 mL (15 mLs Oral Given 11/22/13 1649)  pantoprazole (PROTONIX) EC tablet 40 mg (40 mg Oral Given 11/22/13 1801)  acetaminophen (TYLENOL) tablet 650 mg (650 mg Oral Given 11/22/13 1801)  ipratropium (ATROVENT) nebulizer solution 0.5 mg (0.5 mg Nebulization Given 11/22/13 1911)  albuterol (PROVENTIL) (2.5 MG/3ML) 0.083% nebulizer solution 5 mg (5 mg Nebulization Given 11/22/13 1911)  predniSONE (DELTASONE) tablet 40 mg (40 mg Oral Given 11/22/13 1910)  LORazepam (ATIVAN) tablet 0.5 mg (0.5 mg Oral Given 11/22/13 2125)   She was also given medications for heart burn, which did help her symptoms mildly. Patient was given multiple albuterol and Atrovent breathing treatments in the emergency department. This significantly helped her shortness of breath and wheezing. Dr. Fonnie JarvisBednar saw patient and reports her pain being reproducible and her wheezing being mild at this time. He recommends giving her albuterol inhaler for home and that she can take Tylenol for pain. Recommend followup with her primary care Dr. and Sheral ApleyB.  28 y.o.Waleska Spindel's evaluation  in the Emergency Department is complete. It has been determined that no acute conditions requiring further emergency intervention are present at this time. The patient/guardian have been advised of the diagnosis and plan. We have discussed signs and symptoms that warrant return to the ED, such as changes or worsening in symptoms.  Vital signs are stable at discharge. Filed Vitals:   11/22/13 2044  BP: 139/61  Pulse: 64  Temp:   Resp: 18    Patient/guardian has voiced understanding and agreed to follow-up with the PCP or specialist.    Dorthula Matasiffany G Greene, PA-C 11/24/13 [phone]

## 2013-12-10 ENCOUNTER — Encounter (HOSPITAL_COMMUNITY): Payer: Self-pay | Admitting: *Deleted

## 2013-12-10 ENCOUNTER — Inpatient Hospital Stay (HOSPITAL_COMMUNITY)
Admission: AD | Admit: 2013-12-10 | Discharge: 2013-12-10 | Disposition: A | Discharge status: Home / Self Care | Payer: Medicare Other | Source: Ambulatory Visit | Attending: Obstetrics and Gynecology | Admitting: Obstetrics and Gynecology

## 2013-12-10 ENCOUNTER — Inpatient Hospital Stay (HOSPITAL_COMMUNITY): Payer: Medicare Other

## 2013-12-10 DIAGNOSIS — Z87891 Personal history of nicotine dependence: Secondary | ICD-10-CM | POA: Insufficient documentation

## 2013-12-10 DIAGNOSIS — O26859 Spotting complicating pregnancy, unspecified trimester: Secondary | ICD-10-CM | POA: Insufficient documentation

## 2013-12-10 DIAGNOSIS — O4692 Antepartum hemorrhage, unspecified, second trimester: Secondary | ICD-10-CM

## 2013-12-10 DIAGNOSIS — O4690 Antepartum hemorrhage, unspecified, unspecified trimester: Secondary | ICD-10-CM

## 2013-12-10 DIAGNOSIS — R109 Unspecified abdominal pain: Secondary | ICD-10-CM | POA: Diagnosis present

## 2013-12-10 LAB — WET PREP, GENITAL
TRICH WET PREP: NONE SEEN
Yeast Wet Prep HPF POC: NONE SEEN

## 2013-12-10 NOTE — MAU Note (Signed)
Has 2 complaints; c/o having small amount bleeding this AM (mostly seen when she wiped); had intercourse yesterday; c/o cramping in upper abdomen,-under breasts; hit earlier today accidentally in upper abdomen with tray at work; dopplered FHR @ 164; no bleeding now; noted bleeding from 0500 til 0900 this AM; has not seen any blood since 0900 this AM

## 2013-12-10 NOTE — MAU Provider Note (Signed)
Chief Complaint: Vaginal Bleeding and Abdominal Pain   First Provider Initiated Contact with Patient 12/10/13 1705     SUBJECTIVE HPI: Shannon Aguirre is a 28 y.o. G1P0 at 4438w5d by LMP who presents to maternity admissions reporting an episode of light vaginal spotting this morning which resolved 2 hours later.  Also, at work today, after the bleeding episode, she was hit in the abdomen by a serving tray.  The impact occurred just below her ribs. She reports soreness to touch in that location.  Last intercourse was last night.  She reports good fetal movement, denies LOF, vaginal itching/burning, urinary symptoms, h/a, dizziness, n/v, or fever/chills.    Past Medical History  Diagnosis Date  . Asthma   . Allergy   . Depression    Past Surgical History  Procedure Laterality Date  . Breast surgery     History   Social History  . Marital Status: Single    Spouse Name: N/A    Number of Children: N/A  . Years of Education: N/A   Occupational History  . Not on file.   Social History Main Topics  . Smoking status: Former Smoker -- 0.50 packs/day for 8 years    Types: Cigarettes  . Smokeless tobacco: Not on file  . Alcohol Use: Yes     Comment: social drinker  . Drug Use: No  . Sexual Activity: Yes    Birth Control/ Protection: Condom   Other Topics Concern  . Not on file   Social History Narrative  . No narrative on file   No current facility-administered medications on file prior to encounter.   Current Outpatient Prescriptions on File Prior to Encounter  Medication Sig Dispense Refill  . acetaminophen (TYLENOL) 500 MG tablet Take 1,000 mg by mouth every 6 (six) hours as needed (for pain.).       Marland Kitchen. albuterol (PROVENTIL HFA;VENTOLIN HFA) 108 (90 BASE) MCG/ACT inhaler Inhale 2 puffs into the lungs every 6 (six) hours as needed for wheezing.      . Prenatal Vit-Fe Fumarate-FA (PRENATAL MULTIVITAMIN) TABS tablet Take 1 tablet by mouth daily at 12 noon.       Allergies  Allergen  Reactions  . Nsaids Anaphylaxis  . Soybean-Containing Drug Products Swelling    Swelling of throat Swelling of lips    ROS: Pertinent items in HPI  OBJECTIVE Blood pressure 124/57, pulse 90, temperature 97.9 F (36.6 C), temperature source Oral, resp. rate 18, height 5\' 4"  (1.626 m), weight 123.378 kg (272 lb), last menstrual period 07/25/2013. GENERAL: Well-developed, well-nourished female in no acute distress.  HEENT: Normocephalic HEART: normal rate RESP: normal effort ABDOMEN: Soft, non-tender EXTREMITIES: Nontender, no edema NEURO: Alert and oriented Pelvic exam: Cervix pink, visually closed, without lesion, scant white creamy discharge, no blood noted, vaginal walls and external genitalia normal Bimanual exam: Cervix 0/long/high, firm, posterior, no blood on glove following exam  LAB RESULTS Results for orders placed during the hospital encounter of 12/10/13 (from the past 24 hour(s))  WET PREP, GENITAL     Status: Abnormal   Collection Time    12/10/13  5:05 PM      Result Value Ref Range   Yeast Wet Prep HPF POC NONE SEEN  NONE SEEN   Trich, Wet Prep NONE SEEN  NONE SEEN   Clue Cells Wet Prep HPF POC MODERATE (*) NONE SEEN   WBC, Wet Prep HPF POC FEW (*) NONE SEEN   Blood type is O Positive   IMAGING  ASSESSMENT 1. Vaginal bleeding in pregnancy, second trimester     PLAN Consult Dr Claiborne Billings Discharge home with bleeding precautions F/U as scheduled Return to MAU as needed for emergencies    Medication List         acetaminophen 500 MG tablet  Commonly known as:  TYLENOL  Take 1,000 mg by mouth every 6 (six) hours as needed (for pain.).     albuterol 108 (90 BASE) MCG/ACT inhaler  Commonly known as:  PROVENTIL HFA;VENTOLIN HFA  Inhale 2 puffs into the lungs every 6 (six) hours as needed for wheezing.     budesonide-formoterol 80-4.5 MCG/ACT inhaler  Commonly known as:  SYMBICORT  Inhale 2 puffs into the lungs daily.     prenatal multivitamin  Tabs tablet  Take 1 tablet by mouth daily at 12 noon.           Follow-up Information   Follow up with Philip Aspen, DO. (As scheduled)    Specialty:  Obstetrics and Gynecology   Contact information:   [address] [place] [address] [phone]       Sharen Counter Certified Nurse-Midwife 12/10/2013  7:06 PM

## 2013-12-10 NOTE — Discharge Instructions (Signed)
Vaginal Bleeding During Pregnancy, Second Trimester °A small amount of bleeding (spotting) from the vagina is relatively common in pregnancy. It usually stops on its own. Various things can cause bleeding or spotting in pregnancy. Some bleeding may be related to the pregnancy, and some may not. Sometimes the bleeding is normal and is not a problem. However, bleeding can also be a sign of something serious. Be sure to tell your health care provider about any vaginal bleeding right away. °Some possible causes of vaginal bleeding during the second trimester include: °· Infection, inflammation, or growths on the cervix.   °· The placenta may be partially or completely covering the opening of the cervix inside the uterus (placenta previa). °· The placenta may have separated from the uterus (abruption of the placenta).   °· You may be having early (preterm) labor.   °· The cervix may not be strong enough to keep a baby inside the uterus (cervical insufficiency).   °· Tiny cysts may have developed in the uterus instead of pregnancy tissue (molar pregnancy).  °HOME CARE INSTRUCTIONS  °Watch your condition for any changes. The following actions may help to lessen any discomfort you are feeling: °· Follow your health care provider's instructions for limiting your activity. If your health care provider orders bed rest, you may need to stay in bed and only get up to use the bathroom. However, your health care provider may allow you to continue light activity. °· If needed, make plans for someone to help with your regular activities and responsibilities while you are on bed rest. °· Keep track of the number of pads you use each day, how often you change pads, and how soaked (saturated) they are. Write this down. °· Do not use tampons. Do not douche. °· Do not have sexual intercourse or orgasms until approved by your health care provider. °· If you pass any tissue from your vagina, save the tissue so you can show it to your  health care provider. °· Only take over-the-counter or prescription medicines as directed by your health care provider. °· Do not take aspirin because it can make you bleed. °· Do not exercise or perform any strenuous activities or heavy lifting without your health care provider's permission. °· Keep all follow-up appointments as directed by your health care provider. °SEEK MEDICAL CARE IF: °· You have any vaginal bleeding during any part of your pregnancy. °· You have cramps or labor pains. °· You have a fever, not controlled by medicine. °SEEK IMMEDIATE MEDICAL CARE IF:  °· You have severe cramps in your back or belly (abdomen). °· You have contractions. °· You have chills. °· You pass large clots or tissue from your vagina. °· Your bleeding increases. °· You feel light-headed or weak, or you have fainting episodes. °· You are leaking fluid or have a gush of fluid from your vagina. °MAKE SURE YOU: °· Understand these instructions. °· Will watch your condition. °· Will get help right away if you are not doing well or get worse. °Document Released: 02/24/2005 Document Revised: 05/22/2013 Document Reviewed: 01/22/2013 °ExitCare® Patient Information ©2015 [employer]. This information is not intended to replace advice given to you by your health care provider. Make sure you discuss any questions you have with your health care provider. ° °

## 2014-01-23 ENCOUNTER — Other Ambulatory Visit (HOSPITAL_COMMUNITY): Payer: Self-pay | Admitting: Obstetrics & Gynecology

## 2014-01-23 DIAGNOSIS — O358XX0 Maternal care for other (suspected) fetal abnormality and damage, not applicable or unspecified: Secondary | ICD-10-CM

## 2014-01-24 ENCOUNTER — Ambulatory Visit (HOSPITAL_COMMUNITY)
Admission: RE | Admit: 2014-01-24 | Discharge: 2014-01-24 | Disposition: A | Discharge status: Home / Self Care | Payer: Medicare Other | Source: Ambulatory Visit | Attending: Obstetrics & Gynecology | Admitting: Obstetrics & Gynecology

## 2014-01-24 DIAGNOSIS — Z1389 Encounter for screening for other disorder: Secondary | ICD-10-CM

## 2014-01-24 DIAGNOSIS — Z3689 Encounter for other specified antenatal screening: Secondary | ICD-10-CM | POA: Insufficient documentation

## 2014-01-24 DIAGNOSIS — O358XX0 Maternal care for other (suspected) fetal abnormality and damage, not applicable or unspecified: Secondary | ICD-10-CM | POA: Diagnosis not present

## 2014-03-11 ENCOUNTER — Institutional Professional Consult (permissible substitution): Payer: Medicare Other | Admitting: Internal Medicine

## 2014-03-11 ENCOUNTER — Observation Stay (HOSPITAL_COMMUNITY)
Admission: AD | Admit: 2014-03-11 | Discharge: 2014-03-12 | Disposition: A | Discharge status: Home / Self Care | Payer: Medicare Other | Source: Ambulatory Visit | Attending: Obstetrics and Gynecology | Admitting: Obstetrics and Gynecology

## 2014-03-11 ENCOUNTER — Encounter (HOSPITAL_COMMUNITY): Payer: Self-pay | Admitting: *Deleted

## 2014-03-11 DIAGNOSIS — J45909 Unspecified asthma, uncomplicated: Secondary | ICD-10-CM | POA: Insufficient documentation

## 2014-03-11 DIAGNOSIS — F329 Major depressive disorder, single episode, unspecified: Secondary | ICD-10-CM | POA: Insufficient documentation

## 2014-03-11 DIAGNOSIS — Z87891 Personal history of nicotine dependence: Secondary | ICD-10-CM | POA: Diagnosis not present

## 2014-03-11 DIAGNOSIS — M79662 Pain in left lower leg: Secondary | ICD-10-CM | POA: Diagnosis not present

## 2014-03-11 DIAGNOSIS — Z3A32 32 weeks gestation of pregnancy: Secondary | ICD-10-CM | POA: Insufficient documentation

## 2014-03-11 DIAGNOSIS — O99343 Other mental disorders complicating pregnancy, third trimester: Secondary | ICD-10-CM | POA: Insufficient documentation

## 2014-03-11 DIAGNOSIS — M79669 Pain in unspecified lower leg: Secondary | ICD-10-CM | POA: Diagnosis present

## 2014-03-11 DIAGNOSIS — O26893 Other specified pregnancy related conditions, third trimester: Principal | ICD-10-CM | POA: Insufficient documentation

## 2014-03-11 LAB — CBC WITH DIFFERENTIAL/PLATELET
Basophils Absolute: 0.1 10*3/uL (ref 0.0–0.1)
Basophils Relative: 0 % (ref 0–1)
EOS ABS: 0.2 10*3/uL (ref 0.0–0.7)
EOS PCT: 2 % (ref 0–5)
HEMATOCRIT: 30.3 % — AB (ref 36.0–46.0)
Hemoglobin: 10.1 g/dL — ABNORMAL LOW (ref 12.0–15.0)
LYMPHS ABS: 2.8 10*3/uL (ref 0.7–4.0)
LYMPHS PCT: 24 % (ref 12–46)
MCH: 28.5 pg (ref 26.0–34.0)
MCHC: 33.3 g/dL (ref 30.0–36.0)
MCV: 85.4 fL (ref 78.0–100.0)
MONO ABS: 0.9 10*3/uL (ref 0.1–1.0)
MONOS PCT: 8 % (ref 3–12)
Neutro Abs: 7.4 10*3/uL (ref 1.7–7.7)
Neutrophils Relative %: 66 % (ref 43–77)
Platelets: 238 10*3/uL (ref 150–400)
RBC: 3.55 MIL/uL — AB (ref 3.87–5.11)
RDW: 13.9 % (ref 11.5–15.5)
WBC: 11.3 10*3/uL — AB (ref 4.0–10.5)

## 2014-03-11 MED ORDER — ENOXAPARIN SODIUM 150 MG/ML ~~LOC~~ SOLN
1.0000 mg/kg | Freq: Two times a day (BID) | SUBCUTANEOUS | Status: DC
  Administered 2014-03-11: 140 mg via SUBCUTANEOUS
  Filled 2014-03-11 (×2): qty 1

## 2014-03-11 MED ORDER — DOCUSATE SODIUM 100 MG PO CAPS
100.0000 mg | ORAL_CAPSULE | Freq: Every day | ORAL | Status: DC
  Filled 2014-03-11: qty 1

## 2014-03-11 MED ORDER — ACETAMINOPHEN 325 MG PO TABS
650.0000 mg | ORAL_TABLET | ORAL | Status: DC | PRN
  Administered 2014-03-12: 650 mg via ORAL
  Filled 2014-03-11: qty 2

## 2014-03-11 MED ORDER — ZOLPIDEM TARTRATE 5 MG PO TABS: 5.0000 mg | ORAL_TABLET | Freq: Every evening | ORAL | Status: DC | PRN

## 2014-03-11 MED ORDER — CALCIUM CARBONATE ANTACID 500 MG PO CHEW
2.0000 | CHEWABLE_TABLET | ORAL | Status: DC | PRN
  Filled 2014-03-11: qty 2

## 2014-03-11 NOTE — MAU Note (Signed)
Pt reports this am she noticed red spots on her ankle and foot, pt states the spots do not itch but they do hurt.

## 2014-03-11 NOTE — MAU Provider Note (Signed)
History     CSN: [id number]  Arrival date and time: 03/11/14 2136   None     No chief complaint on file.  HPI This is a 28 y.o. female at 5651w5d who presents with c/o red spots on left ankle and foot. States they are painful and do not itch. Also complains of increased edema of leg and foot, Left > Right. Reports left calf pain when walking.  Has never had this happen before. Denies contractions, leaking or bleeding.   RN note:  Pt reports this am she noticed red spots on her ankle and foot, pt states the spots do not itch but they do hurt.        OB History   Grav Para Term Preterm Abortions TAB SAB Ect Mult Living   1               Past Medical History  Diagnosis Date  . Asthma   . Allergy   . Depression     Past Surgical History  Procedure Laterality Date  . Breast surgery      Family History  Problem Relation Age of Onset  . Bipolar disorder Mother   . Cancer Father   . Sickle cell anemia Brother   . Breast cancer Maternal Grandmother   . Diabetes Maternal Grandmother   . Diabetes Maternal Grandfather     History  Substance Use Topics  . Smoking status: Former Smoker -- 0.50 packs/day for 8 years    Types: Cigarettes  . Smokeless tobacco: Not on file  . Alcohol Use: Yes     Comment: social drinker    Allergies:  Allergies  Allergen Reactions  . Nsaids Anaphylaxis  . Soybean-Containing Drug Products Swelling    Swelling of throat Swelling of lips    Facility-administered medications prior to admission  Medication Dose Route Frequency Provider Last Rate Last Dose  . ondansetron (ZOFRAN-ODT) disintegrating tablet 4 mg  4 mg Oral Once Carmelina DaneJeffery S Anderson, MD       Prescriptions prior to admission  Medication Sig Dispense Refill  . acetaminophen (TYLENOL) 500 MG tablet Take 1,000 mg by mouth every 6 (six) hours as needed (for pain.).       Marland Kitchen. albuterol (PROVENTIL HFA;VENTOLIN HFA) 108 (90 BASE) MCG/ACT inhaler Inhale 2 puffs into the lungs every  6 (six) hours as needed for wheezing.      . budesonide-formoterol (SYMBICORT) 80-4.5 MCG/ACT inhaler Inhale 2 puffs into the lungs daily.      . Prenatal Vit-Fe Fumarate-FA (PRENATAL MULTIVITAMIN) TABS tablet Take 1 tablet by mouth daily at 12 noon.        Review of Systems  Constitutional: Negative for fever, chills and malaise/fatigue.  Cardiovascular: Positive for leg swelling (with left calf pain).  Gastrointestinal: Negative for nausea, vomiting and abdominal pain.  Neurological: Negative for weakness and headaches.  Endo/Heme/Allergies:       Red painful spots on Left foot and ankle    Physical Exam   Blood pressure 129/63, pulse 86, temperature 98 F (36.7 C), temperature source Oral, resp. rate 20, height 5\' 4"  (1.626 m), weight 305 lb (138.347 kg), last menstrual period 07/25/2013, SpO2 100.00%.  Physical Exam  Constitutional: She is oriented to person, place, and time. She appears well-developed and well-nourished. No distress.  HENT:  Head: Normocephalic.  Cardiovascular: Normal rate.   Respiratory: Effort normal.  GI: Soft. There is no tenderness.  Musculoskeletal: Normal range of motion. She exhibits edema (2+3+ pretibial and pedal  edema bilaterally, left seems tighter) and tenderness (left calf tender with foot flexion).  Bilateral dorsalis pedis and posterior tibial pulse palpable There are multiple circular macular lesions left foot and ankle, various sizes 1-403mm, starburst type of shape, red, painful to touch.  No scaling or raised lesions.  Left calf 49cm, Right 50.5cm  Neurological: She is alert and oriented to person, place, and time.  Skin: Skin is warm and dry.  Psychiatric: She has a normal mood and affect.  FHR reactive Occasional mild contractions  MAU Course  Procedures  MDM No results found for this or any previous visit (from the past 72 hour(s)).   Assessment and Plan  A:  SIUP at 6330w5d       Left calf pain       Left foot and ankle  lesions      Bilateral pretibial and pedal edema      Cannot rule out DVT  P:  Discussed with Dr Claiborne Billingsallahan       Admit to Antenatal for Lovenox overnight then Doppler studies in AM    [facility],Parvin Stetzer 03/11/2014, 10:16 PM

## 2014-03-11 NOTE — MAU Note (Signed)
Left calf measures 49 cm, Right calf measures 50,5 cm

## 2014-03-12 ENCOUNTER — Encounter: Payer: Medicare Other | Admitting: Surgery

## 2014-03-12 DIAGNOSIS — M79609 Pain in unspecified limb: Secondary | ICD-10-CM

## 2014-03-12 DIAGNOSIS — O26893 Other specified pregnancy related conditions, third trimester: Secondary | ICD-10-CM | POA: Diagnosis not present

## 2014-03-12 LAB — HEPARIN ANTI-XA: Heparin LMW: <0.1 IU/mL

## 2014-03-12 NOTE — Plan of Care (Signed)
Problem: Phase II Progression Outcomes Goal: Electronic fetal monitoring(Doppler,Continuous,Intermittent) EFM (Doppler, Continuous, Intermittent) Outcome: Progressing NST this am Goal: Labs/tests as ordered Labs/tests as ordered (Magnesium level, CBG's, CBC, CMET, 24 hr Urine, Amniocentesis, Ultrasound, Other) Outcome: Progressing Doppler studies this AM negative for DVT Goal: Medications as ordered (see description) Medications as ordered (Magnesium Sulfate, Steroids, PO Tocolysis, Antibiotics, Terbutaline Pump, Other) Outcome: Progressing Lovonox last night given per orders

## 2014-03-12 NOTE — Progress Notes (Signed)
*  PRELIMINARY RESULTS* Vascular Ultrasound Left lower extremity venous duplex has been completed.  Preliminary findings: no evidence of DVT.  Farrel DemarkJill Eunice, RDMS, RVT  03/12/2014, 8:57 AM

## 2014-03-12 NOTE — Progress Notes (Signed)
Pt denies the need for a social work consult, since there is no need for additional medication at this time.  Pt was concerned with the cost of adding medication for the treatment of a DVT

## 2014-03-12 NOTE — Progress Notes (Signed)
US of left LE negative for DVT.  Only sign of DVT is Denna HaggardHomans- which is not diagnostic.  Will d/c pt to home with precautions and f/u next week.  Stop lovenox.  Purpura is stable on ankle and plateles are fine.      This am's NST is pending- d/c after confirm reactive.

## 2014-03-12 NOTE — Progress Notes (Signed)
28 y.o. G1P0 3426w6d HD#1 admitted for 31 WEEKS RED SPOTS ON ANKLE FOOT.  Currently being treated with lovenox for presumed DVT.  Pt currently stable with no c/o except pain where the red spots are on L ankle.  Good FM.  NO LOF, contractions or bleeding.  Filed Vitals:   03/11/14 2150 03/12/14 0001 03/12/14 0045 03/12/14 0755  BP: 129/63 118/58 118/58 122/61  Pulse: 86 90 90 82  Temp: 98 F (36.7 C) 98.2 F (36.8 C) 98.2 F (36.8 C) 97.5 F (36.4 C)  TempSrc: Oral Oral Oral Oral  Resp: 20 20 20 20   Height: 5\' 4"  (1.626 m)  5\' 4"  (1.626 m)   Weight: 138.347 kg (305 lb)  136.079 kg (300 lb)   SpO2: 100%       Lungs CTA Cor RRR Abd  Soft, gravid, nontender Ex Left calf per Dr. Claiborne Billingsallahan slightly bigger than R.  Pain only over spots- no cords or erythema.  +Homans sign on L.  Small red purpura on L ankle- none blanching.  R leg normal. FHTs  Last night 120s, good short term variability, NST R Toco  rare  Results for orders placed during the hospital encounter of 03/11/14 (from the past 24 hour(s))  LOW MOLECULAR WGT HEPARIN (FRACTIONATED)     Status: None   Collection Time    03/11/14 11:25 PM      Result Value Ref Range   Heparin LMW <0.10    CBC WITH DIFFERENTIAL     Status: Abnormal   Collection Time    03/11/14 11:25 PM      Result Value Ref Range   WBC 11.3 (*) 4.0 - 10.5 K/uL   RBC 3.55 (*) 3.87 - 5.11 MIL/uL   Hemoglobin 10.1 (*) 12.0 - 15.0 g/dL   HCT 52.830.3 (*) 41.336.0 - 24.446.0 %   MCV 85.4  78.0 - 100.0 fL   MCH 28.5  26.0 - 34.0 pg   MCHC 33.3  30.0 - 36.0 g/dL   RDW 01.013.9  27.211.5 - 53.615.5 %   Platelets 238  150 - 400 K/uL   Neutrophils Relative % 66  43 - 77 %   Neutro Abs 7.4  1.7 - 7.7 K/uL   Lymphocytes Relative 24  12 - 46 %   Lymphs Abs 2.8  0.7 - 4.0 K/uL   Monocytes Relative 8  3 - 12 %   Monocytes Absolute 0.9  0.1 - 1.0 K/uL   Eosinophils Relative 2  0 - 5 %   Eosinophils Absolute 0.2  0.0 - 0.7 K/uL   Basophils Relative 0  0 - 1 %   Basophils Absolute 0.1   0.0 - 0.1 K/uL    A:  HD#1  4926w6d with possible DVT.  Ankle purpura- platelets are normal.    P: Awaiting LE dopplers.  Pt on Lovenox for now.  Aja Bolander A

## 2014-03-12 NOTE — H&P (Signed)
28 y.o. 6469w6d  G1P0 comes in c/o left calf pain, worsening swelling and the new onset of red flat lesions on ankles and feet.  No other complaints.  Past Medical History  Diagnosis Date  . Asthma   . Allergy   . Depression     Past Surgical History  Procedure Laterality Date  . Breast surgery      OB History  Gravida Para Term Preterm AB SAB TAB Ectopic Multiple Living  1             # Outcome Date GA Lbr Len/2nd Weight Sex Delivery Anes PTL Lv  1 CUR               History   Social History  . Marital Status: Single    Spouse Name: N/A    Number of Children: N/A  . Years of Education: N/A   Occupational History  . Not on file.   Social History Main Topics  . Smoking status: Former Smoker -- 0.50 packs/day for 8 years    Types: Cigarettes    Quit date: 09/09/2013  . Smokeless tobacco: Not on file  . Alcohol Use: Yes     Comment: social drinker  . Drug Use: No  . Sexual Activity: Yes    Birth Control/ Protection: None   Other Topics Concern  . Not on file   Social History Narrative  . No narrative on file   Nsaids and Soybean-containing drug products    Other PNC: uncomplicated.    Filed Vitals:   03/12/14 0045  BP: 118/58  Pulse: 90  Temp: 98.2 F (36.8 C)  Resp: 20     Lungs/Cor:  NAD Abdomen:  soft, gravid Ex:  no cords, erythema SVE:  Deferred FHTs:  135, good STV, NST R Toco:  absent   A/P   Admit to antenatal r/o DVT  Observe overnight until dopplers can be done in am  Prophylactic lovenox  Reassess lesions of lower extremities - Rx TEDs  Lekeith Wulf

## 2014-03-12 NOTE — Discharge Summary (Signed)
Physician Discharge Summary  Patient ID: Shannon Aguirre MRN: [id number] DOB/AGE: 27/12/1985 27 y.o.  Admit date: 03/11/2014 Discharge date: 03/12/2014  Admission Diagnoses:R/o DVT, ankle purpura  Discharge Diagnoses: ankle purpura Active Problems:   Calf pain   Discharged Condition: good  Hospital Course: US of left LE negative for DVT.  Pt received one dose of Lovenox and this was discontinued.  Consults: None  Significant Diagnostic Studies: Left LE US  Results for orders placed during the hospital encounter of 03/11/14 (from the past 24 hour(s))  LOW MOLECULAR WGT HEPARIN (FRACTIONATED)     Status: None   Collection Time    03/11/14 11:25 PM      Result Value Ref Range   Heparin LMW <0.10    CBC WITH DIFFERENTIAL     Status: Abnormal   Collection Time    03/11/14 11:25 PM      Result Value Ref Range   WBC 11.3 (*) 4.0 - 10.5 K/uL   RBC 3.55 (*) 3.87 - 5.11 MIL/uL   Hemoglobin 10.1 (*) 12.0 - 15.0 g/dL   HCT 86.530.3 (*) 78.436.0 - 69.646.0 %   MCV 85.4  78.0 - 100.0 fL   MCH 28.5  26.0 - 34.0 pg   MCHC 33.3  30.0 - 36.0 g/dL   RDW 29.513.9  28.411.5 - 13.215.5 %   Platelets 238  150 - 400 K/uL   Neutrophils Relative % 66  43 - 77 %   Neutro Abs 7.4  1.7 - 7.7 K/uL   Lymphocytes Relative 24  12 - 46 %   Lymphs Abs 2.8  0.7 - 4.0 K/uL   Monocytes Relative 8  3 - 12 %   Monocytes Absolute 0.9  0.1 - 1.0 K/uL   Eosinophils Relative 2  0 - 5 %   Eosinophils Absolute 0.2  0.0 - 0.7 K/uL   Basophils Relative 0  0 - 1 %   Basophils Absolute 0.1  0.0 - 0.1 K/uL    Treatments: one dose of lovenox- discontinued  Discharge Exam: Blood pressure 122/61, pulse 82, temperature 97.5 F (36.4 C), temperature source Oral, resp. rate 20, height 5\' 4"  (1.626 m), weight 136.079 kg (300 lb), last menstrual period 07/25/2013, SpO2 100.00%.   Disposition: 01-Home or Self Care  Discharge Instructions   Discharge activity:  Up to eat    Complete by:  As directed      Discharge diet:  No restrictions     Complete by:  As directed      Discharge instructions    Complete by:  As directed   Modified bedrest.  Refrain from intercourse.  Count baby's movements in 1 hour per day- if you don't get 6 in that hour, call.     Do not have sex or do anything that might make you have an orgasm    Complete by:  As directed      Fetal Kick Count:  Lie on our left side for one hour after a meal, and count the number of times your baby kicks.  If it is less than 5 times, get up, move around and drink some juice.  Repeat the test 30 minutes later.  If it is still less than 5 kicks in an hour, notify your doctor.    Complete by:  As directed      LABOR:  When conractions begin, you should start to time them from the beginning of one contraction to the beginning  of the next.  When contractions are 5 - 10 minutes apart or less and have been regular for at least an hour, you should call your health care provider.    Complete by:  As directed      Notify physician for bleeding from the vagina    Complete by:  As directed      Notify physician for blurring of vision or spots before the eyes    Complete by:  As directed      Notify physician for chills or fever    Complete by:  As directed      Notify physician for fainting spells, "black outs" or loss of consciousness    Complete by:  As directed      Notify physician for increase in vaginal discharge    Complete by:  As directed      Notify physician for leaking of fluid    Complete by:  As directed      Notify physician for pain or burning when urinating    Complete by:  As directed      Notify physician for pelvic pressure (sudden increase)    Complete by:  As directed      Notify physician for severe or continued nausea or vomiting    Complete by:  As directed      Notify physician for sudden gushing of fluid from the vagina (with or without continued leaking)    Complete by:  As directed      Notify physician for sudden, constant, or occasional  abdominal pain    Complete by:  As directed      Notify physician if baby moving less than usual    Complete by:  As directed             Medication List         acetaminophen 500 MG tablet  Commonly known as:  TYLENOL  Take 1,000 mg by mouth every 6 (six) hours as needed (for pain.).     albuterol 108 (90 BASE) MCG/ACT inhaler  Commonly known as:  PROVENTIL HFA;VENTOLIN HFA  Inhale 2 puffs into the lungs every 6 (six) hours as needed for wheezing.     budesonide-formoterol 80-4.5 MCG/ACT inhaler  Commonly known as:  SYMBICORT  Inhale 2 puffs into the lungs daily.     IRON PO  Take 1 tablet by mouth daily.     prenatal multivitamin Tabs tablet  Take 1 tablet by mouth daily at 12 noon.           Follow-up Information   Follow up with Philip AspenALLAHAN, SIDNEY, DO. (keep appt next week)    Specialty:  Obstetrics and Gynecology   Contact information:   [address] [place] [address] [phone]       Signed: Loney LaurenceHORVATH,Lanny Donoso A 03/12/2014, 9:04 AM

## 2014-03-15 ENCOUNTER — Encounter: Payer: Self-pay | Admitting: Internal Medicine

## 2014-03-15 ENCOUNTER — Ambulatory Visit (INDEPENDENT_AMBULATORY_CARE_PROVIDER_SITE_OTHER): Payer: Medicare Other | Admitting: Internal Medicine

## 2014-03-15 VITALS — BP 122/66 | HR 96 | Temp 97.8°F | Ht 64.0 in | Wt 310.6 lb

## 2014-03-15 DIAGNOSIS — J4530 Mild persistent asthma, uncomplicated: Secondary | ICD-10-CM

## 2014-03-15 NOTE — Patient Instructions (Signed)
Work on inhaler technique:  relax and gently blow all the way out then take a nice smooth deep breath back in, triggering the inhaler at same time you start breathing in.  Hold for up to 5 seconds if you can.  Rinse and gargle with water when done     Plan A = automatic =Continue symbicort 80 Take 2 puffs first thing in am and then another 2 puffs about 12 hours later.   Plan B = back up  Only use your albuterol as a rescue medication to be used if you can't catch your breath by resting or doing a relaxed purse lip breathing pattern.  - The less you use it, the better it will work when you need it. - Ok to use up to 2 puffs  every 4 hours if you must but call for immediate appointment if use goes up over your usual need - Don't leave home without it !!  (think of it like the spare tire for your car)   Plan C = crisis/contingency= nebulizer albuterol 2.5 mg every 4 hours if needed   Please schedule a follow up office visit in 2 weeks, sooner if needed

## 2014-03-15 NOTE — Progress Notes (Signed)
   Subjective:    Patient ID: Shannon Aguirre, female    DOB: 06/27/1985   MRN: [id number]  HPI  [age] yobf quit smoking 08/2013 when found out she was pregnant referred to pulmonary clinic by  Dr Philip AspenSidney Callahan for asthma evaluaiton on 03/15/2014   03/15/2014 1st [facility] office visit/ Otha Monical  / symbicort 80 with poor hfa/ [redacted] weeks gestation  Chief Complaint  Patient presents with  . Advice Only    refer Dr. Cher NakaiHarvath for asthma. reports was DX as an infant. C/o increased wheezing and SOB w/ exertion. uses albuterol 4-5 times daily.   has seen "allergist at [facility]"  Shots didn't help her her asthma  Tends to use saba as maint, confused between proair/symbicort but once found out pregnant and started symbicort by calahan and better until 4 weeks prior to OV  And started using more albuterol mostly with exertion  Baseline ventolin qod / no neb  Last ventolin was one day prior to OV   Last neb was 10/14  Min dry cough/ sensation can't take deep breath but no real cp or chest tightness  No obvious other patterns in day to day or daytime variabilty or assoc chronic cough or cp or chest tightness, subjective wheeze overt sinus or hb symptoms. No unusual exp hx or h/o childhood pna/ asthma or knowledge of premature birth.  Sleeping ok without nocturnal  or early am exacerbation  of respiratory  c/o's or need for noct saba. Also denies any obvious fluctuation of symptoms with weather or environmental changes or other aggravating or alleviating factors except as outlined above   Current Medications, Allergies, Complete Past Medical History, Past Surgical History, Family History, and Social History were reviewed in [facility] electronic [facility] record.             Review of Systems  Constitutional: Negative for fever and unexpected weight change.  HENT: Negative for congestion, dental problem, ear pain, nosebleeds, postnasal drip, rhinorrhea, sinus pressure, sneezing, sore throat  and trouble swallowing.   Eyes: Negative for redness and itching.  Respiratory: Positive for cough and shortness of breath. Negative for chest tightness and wheezing.   Cardiovascular: Positive for chest pain and leg swelling. Negative for palpitations.  Gastrointestinal: Negative for nausea and vomiting.  Genitourinary: Negative for dysuria.  Musculoskeletal: Positive for arthralgias. Negative for joint swelling.  Skin: Negative for rash.  Neurological: Positive for headaches.  Hematological: Does not bruise/bleed easily.  Psychiatric/Behavioral: Positive for dysphoric mood. The patient is nervous/anxious.        Objective:   Physical Exam  Wt Readings from Last 3 Encounters:  03/15/14 310 lb 9.6 oz (140.887 kg)  03/12/14 300 lb (136.079 kg)  12/10/13 272 lb (123.378 kg)     Pleasant amb bf nad  HEENT: nl dentition, turbinates, and orophanx. Nl external ear canals without cough reflex   NECK :  without JVD/Nodes/TM/ nl carotid upstrokes bilaterally   LUNGS: no acc muscle use, clear to A and P bilaterally without cough on insp or exp maneuvers   CV:  RRR  no s3 or murmur or increase in P2, no edema   ABD: c/w gestational age IUP   MS:  warm without deformities, calf tenderness, cyanosis or clubbing  SKIN: warm and dry without lesions    NEURO:  alert, approp, no deficits         Assessment & Plan:

## 2014-03-16 DIAGNOSIS — J4530 Mild persistent asthma, uncomplicated: Secondary | ICD-10-CM | POA: Insufficient documentation

## 2014-03-16 NOTE — Assessment & Plan Note (Addendum)
DDX of  difficult airways management all start with A and  include Adherence, Ace Inhibitors, Acid Reflux, Active Sinus Disease, Alpha 1 Antitripsin deficiency, Anxiety masquerading as Airways dz,  ABPA,  allergy(esp in young), Aspiration (esp in elderly), Adverse effects of DPI,  Active smokers, plus two Bs  = Bronchiectasis and Beta blocker use..and one C= CHF  Adherence is always the initial "prime suspect" and is a multilayered concern that requires a "trust but verify" approach in every patient - starting with knowing how to use medications, especially inhalers, correctly, keeping up with refills and understanding the fundamental difference between maintenance and prns vs those medications only taken for a very short course and then stopped and not refilled.  The proper method of use, as well as anticipated side effects, of a metered-dose inhaler are discussed and demonstrated to the patient. Improved effectiveness after extensive coaching during this visit to a level of approximately  90% but baseline < 50% so should do fine on symbicort 80 2 bid but needs to understand how / when to use saba, reviewed  ? Acid (or non-acid) GERD > always difficult to exclude as up to 75% of pts in some series report no assoc GI/ Heartburn symptoms> reviewed diet, raise head of bed, low threshold to add ppi next  ? Anxiety related to effects of 1st near term IUP, progesterone and obesity effects on her breathing, reviewed how to tell this from asthma.  ? Chf/ no evidence at all to support    Each maintenance medication was reviewed in detail including most importantly the difference between maintenance and as needed and under what circumstances the prns are to be used.  Please see instructions for details which were reviewed in writing and the patient given a copy.

## 2014-04-01 ENCOUNTER — Encounter: Payer: Self-pay | Admitting: Internal Medicine

## 2014-04-02 ENCOUNTER — Inpatient Hospital Stay (HOSPITAL_COMMUNITY)
Admission: AD | Admit: 2014-04-02 | Discharge: 2014-04-02 | Disposition: A | Discharge status: Home / Self Care | Payer: Medicare Other | Source: Ambulatory Visit | Attending: Obstetrics and Gynecology | Admitting: Obstetrics and Gynecology

## 2014-04-02 ENCOUNTER — Ambulatory Visit: Payer: Medicare Other | Admitting: Internal Medicine

## 2014-04-02 ENCOUNTER — Encounter (HOSPITAL_COMMUNITY): Payer: Self-pay | Admitting: *Deleted

## 2014-04-02 DIAGNOSIS — Z87891 Personal history of nicotine dependence: Secondary | ICD-10-CM | POA: Insufficient documentation

## 2014-04-02 DIAGNOSIS — O368130 Decreased fetal movements, third trimester, not applicable or unspecified: Secondary | ICD-10-CM | POA: Diagnosis present

## 2014-04-02 DIAGNOSIS — Z3A35 35 weeks gestation of pregnancy: Secondary | ICD-10-CM | POA: Diagnosis not present

## 2014-04-02 DIAGNOSIS — O368131 Decreased fetal movements, third trimester, fetus 1: Secondary | ICD-10-CM

## 2014-04-02 LAB — URINALYSIS, ROUTINE W REFLEX MICROSCOPIC
Bilirubin Urine: NEGATIVE
Glucose, UA: NEGATIVE mg/dL
Hgb urine dipstick: NEGATIVE
Ketones, ur: NEGATIVE mg/dL
Leukocytes, UA: NEGATIVE
NITRITE: NEGATIVE
PH: 6.5 (ref 5.0–8.0)
Protein, ur: NEGATIVE mg/dL
SPECIFIC GRAVITY, URINE: 1.02 (ref 1.005–1.030)
Urobilinogen, UA: 0.2 mg/dL (ref 0.0–1.0)

## 2014-04-02 NOTE — MAU Provider Note (Signed)
Chief Complaint:  Decreased Fetal Movement   First Provider Initiated Contact with Patient 04/02/14 1750      HPI: Shannon Aguirre is a 28 y.o. G1P0 at 3043w6d who presents to maternity admissions reporting Perception of no fetal movement from 11 PM last night until she felt one movement at 3 PM today. However since placement on monitor she feels abundant movement. Denies contractions, leakage of fluid or vaginal bleeding.   Pregnancy Course: Essentially uncomplicated, obesity, 1 hr GCT 86  Past Medical History: Past Medical History  Diagnosis Date  . Asthma   . Allergy   . Depression     Past obstetric history: OB History  Gravida Para Term Preterm AB SAB TAB Ectopic Multiple Living  1             # Outcome Date GA Lbr Len/2nd Weight Sex Delivery Anes PTL Lv  1 Current               Past Surgical History: Past Surgical History  Procedure Laterality Date  . Breast surgery  x 2    breast reduction and lumpectomy     Family History: Family History  Problem Relation Age of Onset  . Bipolar disorder Mother   . Cancer Father   . Sickle cell anemia Brother   . Breast cancer Maternal Grandmother   . Diabetes Maternal Grandmother   . Diabetes Maternal Grandfather   . Asthma Mother   . Asthma Sister   . Asthma      nephew  . COPD Maternal Aunt     Social History: History  Substance Use Topics  . Smoking status: Former Smoker -- 1.00 packs/day for 8 years    Types: Cigarettes    Quit date: 09/09/2013  . Smokeless tobacco: Not on file  . Alcohol Use: No    Allergies:  Allergies  Allergen Reactions  . Nsaids Anaphylaxis  . Soybean-Containing Drug Products Swelling    Swelling of throat Swelling of lips    Meds:  Facility-administered medications prior to admission  Medication Dose Route Frequency Provider Last Rate Last Dose  . ondansetron (ZOFRAN-ODT) disintegrating tablet 4 mg  4 mg Oral Once Carmelina DaneJeffery S Anderson, MD       Prescriptions prior to admission   Medication Sig Dispense Refill Last Dose  . acetaminophen (TYLENOL) 500 MG tablet Take 1,000 mg by mouth every 6 (six) hours as needed (for pain.).    Past Week at Unknown time  . albuterol (PROVENTIL HFA;VENTOLIN HFA) 108 (90 BASE) MCG/ACT inhaler Inhale 2 puffs into the lungs every 6 (six) hours as needed for wheezing.   Past Week at Unknown time  . albuterol (PROVENTIL) (2.5 MG/3ML) 0.083% nebulizer solution Take 2.5 mg by nebulization every 6 (six) hours as needed for wheezing or shortness of breath.   Past Week at Unknown time  . budesonide-formoterol (SYMBICORT) 80-4.5 MCG/ACT inhaler Inhale 2 puffs into the lungs daily.   04/02/2014 at Unknown time  . ferrous sulfate 325 (65 FE) MG tablet Take 325 mg by mouth daily with breakfast.   04/02/2014 at Unknown time  . Prenatal Vit-Fe Fumarate-FA (PRENATAL MULTIVITAMIN) TABS tablet Take 1 tablet by mouth daily at 12 noon.   04/02/2014 at Unknown time    ROS: Pertinent findings in history of present illness.  Physical Exam  Last menstrual period 07/25/2013.  Filed Vitals:   04/02/14 1704  BP: 131/65  Pulse: 86  Temp: 98.3 F (36.8 C)   GENERAL: Obese female in  no acute distress.  HEENT: normocephalic HEART: normal rate RESP: normal effort ABDOMEN: Soft, non-tender, gravid appropriate for gestational age EXTREMITIES: Nontender, no edema NEURO: alert and oriented     FHT:  Baseline125-130, moderate variability, accelerations present, no decelerations Contractions: sl UI   Labs: Results for orders placed or performed during the hospital encounter of 04/02/14 (from the past 24 hour(s))  Urinalysis, Routine w reflex microscopic     Status: Abnormal   Collection Time: 04/02/14  5:00 PM  Result Value Ref Range   Color, Urine YELLOW YELLOW   APPearance HAZY (A) CLEAR   Specific Gravity, Urine 1.020 1.005 - 1.030   pH 6.5 5.0 - 8.0   Glucose, UA NEGATIVE NEGATIVE mg/dL   Hgb urine dipstick NEGATIVE NEGATIVE   Bilirubin Urine NEGATIVE  NEGATIVE   Ketones, ur NEGATIVE NEGATIVE mg/dL   Protein, ur NEGATIVE NEGATIVE mg/dL   Urobilinogen, UA 0.2 0.0 - 1.0 mg/dL   Nitrite NEGATIVE NEGATIVE   Leukocytes, UA NEGATIVE NEGATIVE    Imaging:  No results found. MAU Course: C/W Dr. Henderson CloudHorvath  Assessment: 1. Decreased fetal movement in pregnancy, antepartum, third trimester, fetus 1   G1 at 5274w6d Cat 1 FHR  Plan: Discharge home Labor precautions and fetal kick counts    Medication List    TAKE these medications        acetaminophen 500 MG tablet  Commonly known as:  TYLENOL  Take 1,000 mg by mouth every 6 (six) hours as needed (for pain.).     albuterol (2.5 MG/3ML) 0.083% nebulizer solution  Commonly known as:  PROVENTIL  Take 2.5 mg by nebulization every 6 (six) hours as needed for wheezing or shortness of breath.     albuterol 108 (90 BASE) MCG/ACT inhaler  Commonly known as:  PROVENTIL HFA;VENTOLIN HFA  Inhale 2 puffs into the lungs every 6 (six) hours as needed for wheezing.     budesonide-formoterol 80-4.5 MCG/ACT inhaler  Commonly known as:  SYMBICORT  Inhale 2 puffs into the lungs daily.     ferrous sulfate 325 (65 FE) MG tablet  Take 325 mg by mouth daily with breakfast.     prenatal multivitamin Tabs tablet  Take 1 tablet by mouth daily at 12 noon.       Follow-up Information    Follow up with Almon HerculesOSS,KENDRA H., MD On 04/16/2014.   Specialty:  Obstetrics and Gynecology   Contact information:   [address] [place] [address] [phone]      Danae OrleansDeirdre C Poe, CNM 04/02/2014 6:03 PM

## 2014-04-02 NOTE — MAU Note (Signed)
Patient states she had not felt fetal movement since last night until one time this afternoon. States she has some mild abdominal pain on and off, no bleeding or leaking.

## 2014-04-02 NOTE — Discharge Instructions (Signed)

## 2014-04-02 NOTE — MAU Note (Signed)
Pt states she had not felt the baby move since last night at 11pm -3pm [facility] come into the hosptial for evaluation

## 2014-04-17 LAB — OB RESULTS CONSOLE GBS: STREP GROUP B AG: NEGATIVE

## 2014-05-09 ENCOUNTER — Encounter (HOSPITAL_COMMUNITY): Payer: Self-pay | Admitting: *Deleted

## 2014-05-09 ENCOUNTER — Inpatient Hospital Stay (HOSPITAL_COMMUNITY)
Admission: AD | Admit: 2014-05-09 | Discharge: 2014-05-12 | DRG: 765 | Disposition: A | Discharge status: Home / Self Care | Payer: Medicare Other | Source: Ambulatory Visit | Attending: Obstetrics and Gynecology | Admitting: Obstetrics and Gynecology

## 2014-05-09 DIAGNOSIS — O4292 Full-term premature rupture of membranes, unspecified as to length of time between rupture and onset of labor: Secondary | ICD-10-CM | POA: Diagnosis not present

## 2014-05-09 DIAGNOSIS — F319 Bipolar disorder, unspecified: Secondary | ICD-10-CM | POA: Diagnosis present

## 2014-05-09 DIAGNOSIS — Z833 Family history of diabetes mellitus: Secondary | ICD-10-CM

## 2014-05-09 DIAGNOSIS — Z6841 Body Mass Index (BMI) 40.0 and over, adult: Secondary | ICD-10-CM | POA: Diagnosis not present

## 2014-05-09 DIAGNOSIS — Z3A39 39 weeks gestation of pregnancy: Secondary | ICD-10-CM | POA: Diagnosis present

## 2014-05-09 DIAGNOSIS — O9952 Diseases of the respiratory system complicating childbirth: Secondary | ICD-10-CM | POA: Diagnosis not present

## 2014-05-09 DIAGNOSIS — O99214 Obesity complicating childbirth: Secondary | ICD-10-CM | POA: Diagnosis not present

## 2014-05-09 DIAGNOSIS — O99343 Other mental disorders complicating pregnancy, third trimester: Secondary | ICD-10-CM | POA: Diagnosis present

## 2014-05-09 DIAGNOSIS — O4290 Premature rupture of membranes, unspecified as to length of time between rupture and onset of labor, unspecified weeks of gestation: Secondary | ICD-10-CM

## 2014-05-09 DIAGNOSIS — J45909 Unspecified asthma, uncomplicated: Secondary | ICD-10-CM | POA: Diagnosis present

## 2014-05-09 DIAGNOSIS — Z87891 Personal history of nicotine dependence: Secondary | ICD-10-CM

## 2014-05-09 HISTORY — DX: Post-traumatic stress disorder, unspecified: F43.10

## 2014-05-09 HISTORY — DX: Headache, unspecified: R51.9

## 2014-05-09 HISTORY — DX: Gastro-esophageal reflux disease without esophagitis: K21.9

## 2014-05-09 HISTORY — DX: Dissociative identity disorder: F44.81

## 2014-05-09 HISTORY — DX: Headache: R51

## 2014-05-09 LAB — CBC
HCT: 34.3 % — ABNORMAL LOW (ref 36.0–46.0)
Hemoglobin: 11.3 g/dL — ABNORMAL LOW (ref 12.0–15.0)
MCH: 28.3 pg (ref 26.0–34.0)
MCHC: 32.9 g/dL (ref 30.0–36.0)
MCV: 85.8 fL (ref 78.0–100.0)
PLATELETS: 218 10*3/uL (ref 150–400)
RBC: 4 MIL/uL (ref 3.87–5.11)
RDW: 14.5 % (ref 11.5–15.5)
WBC: 10.7 10*3/uL — AB (ref 4.0–10.5)

## 2014-05-09 LAB — ABO/RH: ABO/RH(D): O POS

## 2014-05-09 LAB — RPR

## 2014-05-09 LAB — TYPE AND SCREEN
ABO/RH(D): O POS
ANTIBODY SCREEN: NEGATIVE

## 2014-05-09 MED ORDER — OXYCODONE-ACETAMINOPHEN 5-325 MG PO TABS: 1.0000 | ORAL_TABLET | ORAL | Status: DC | PRN

## 2014-05-09 MED ORDER — TERBUTALINE SULFATE 1 MG/ML IJ SOLN: 0.2500 mg | Freq: Once | INTRAMUSCULAR | Status: DC | PRN

## 2014-05-09 MED ORDER — OXYTOCIN 40 UNITS IN LACTATED RINGERS INFUSION - SIMPLE MED
1.0000 m[IU]/min | INTRAVENOUS | Status: DC
  Administered 2014-05-09: 2 m[IU]/min via INTRAVENOUS

## 2014-05-09 MED ORDER — PHENYLEPHRINE 40 MCG/ML (10ML) SYRINGE FOR IV PUSH (FOR BLOOD PRESSURE SUPPORT)
80.0000 ug | PREFILLED_SYRINGE | INTRAVENOUS | Status: DC | PRN
  Filled 2014-05-09: qty 10

## 2014-05-09 MED ORDER — BUTORPHANOL TARTRATE 1 MG/ML IJ SOLN
1.0000 mg | INTRAMUSCULAR | Status: DC | PRN
  Administered 2014-05-09 – 2014-05-10 (×2): 1 mg via INTRAVENOUS
  Filled 2014-05-09 (×2): qty 1

## 2014-05-09 MED ORDER — LACTATED RINGERS IV SOLN
INTRAVENOUS | Status: DC
  Administered 2014-05-10 (×2): via INTRAVENOUS

## 2014-05-09 MED ORDER — OXYTOCIN 40 UNITS IN LACTATED RINGERS INFUSION - SIMPLE MED
1.0000 m[IU]/min | INTRAVENOUS | Status: DC
  Filled 2014-05-09: qty 1000

## 2014-05-09 MED ORDER — FLEET ENEMA 7-19 GM/118ML RE ENEM
1.0000 | ENEMA | Freq: Every day | RECTAL | Status: DC | PRN
Start: 2014-05-09 — End: 2014-05-10

## 2014-05-09 MED ORDER — OXYCODONE-ACETAMINOPHEN 5-325 MG PO TABS: 2.0000 | ORAL_TABLET | ORAL | Status: DC | PRN

## 2014-05-09 MED ORDER — PHENYLEPHRINE 40 MCG/ML (10ML) SYRINGE FOR IV PUSH (FOR BLOOD PRESSURE SUPPORT): 80.0000 ug | PREFILLED_SYRINGE | INTRAVENOUS | Status: DC | PRN

## 2014-05-09 MED ORDER — TERBUTALINE SULFATE 1 MG/ML IJ SOLN: 0.2500 mg | Freq: Once | INTRAMUSCULAR | Status: AC | PRN

## 2014-05-09 MED ORDER — LIDOCAINE HCL (PF) 1 % IJ SOLN: 30.0000 mL | INTRAMUSCULAR | Status: DC | PRN

## 2014-05-09 MED ORDER — OXYTOCIN BOLUS FROM INFUSION: 500.0000 mL | INTRAVENOUS | Status: DC

## 2014-05-09 MED ORDER — OXYTOCIN 40 UNITS IN LACTATED RINGERS INFUSION - SIMPLE MED: 62.5000 mL/h | INTRAVENOUS | Status: DC

## 2014-05-09 MED ORDER — FENTANYL 2.5 MCG/ML BUPIVACAINE 1/10 % EPIDURAL INFUSION (WH - ANES)
14.0000 mL/h | INTRAMUSCULAR | Status: DC | PRN
  Administered 2014-05-10: 14 mL/h via EPIDURAL
  Filled 2014-05-09: qty 125

## 2014-05-09 MED ORDER — LACTATED RINGERS IV SOLN
500.0000 mL | Freq: Once | INTRAVENOUS | Status: AC
  Administered 2014-05-10: 500 mL via INTRAVENOUS

## 2014-05-09 MED ORDER — EPHEDRINE 5 MG/ML INJ: 10.0000 mg | INTRAVENOUS | Status: DC | PRN

## 2014-05-09 MED ORDER — LACTATED RINGERS IV SOLN: 500.0000 mL | INTRAVENOUS | Status: DC | PRN

## 2014-05-09 MED ORDER — DIPHENHYDRAMINE HCL 50 MG/ML IJ SOLN: 12.5000 mg | INTRAMUSCULAR | Status: DC | PRN

## 2014-05-09 MED ORDER — ONDANSETRON HCL 4 MG/2ML IJ SOLN: 4.0000 mg | Freq: Four times a day (QID) | INTRAMUSCULAR | Status: DC | PRN

## 2014-05-09 MED ORDER — INFLUENZA VAC SPLIT QUAD 0.5 ML IM SUSY: 0.5000 mL | PREFILLED_SYRINGE | INTRAMUSCULAR | Status: DC

## 2014-05-09 MED ORDER — CITRIC ACID-SODIUM CITRATE 334-500 MG/5ML PO SOLN
30.0000 mL | ORAL | Status: DC | PRN
  Administered 2014-05-10: 30 mL via ORAL
  Filled 2014-05-09: qty 15

## 2014-05-09 MED ORDER — ACETAMINOPHEN 325 MG PO TABS: 650.0000 mg | ORAL_TABLET | ORAL | Status: DC | PRN

## 2014-05-09 NOTE — Progress Notes (Signed)
Pt uncomfortable but doing ok with only IV pain meds.  FHTS 120s, g STV, NST R Toco q 3  SVE 3/80/-2  Continue pitocin.

## 2014-05-09 NOTE — H&P (Signed)
28 y.o. 7864w6d  G1P0 comes in c/o SROM at 0400.  Otherwise has good fetal movement and no bleeding.  Past Medical History  Diagnosis Date  . Asthma   . Allergy   . Depression   . PTSD (post-traumatic stress disorder)     Past Surgical History  Procedure Laterality Date  . Breast surgery  x 2    breast reduction and lumpectomy    OB History  Gravida Para Term Preterm AB SAB TAB Ectopic Multiple Living  1         0    # Outcome Date GA Lbr Len/2nd Weight Sex Delivery Anes PTL Lv  1 Current               History   Social History  . Marital Status: Single    Spouse Name: N/A    Number of Children: N/A  . Years of Education: N/A   Occupational History  . unemployed    Social History Main Topics  . Smoking status: Former Smoker -- 1.00 packs/day for 8 years    Types: Cigarettes    Quit date: 09/09/2013  . Smokeless tobacco: Not on file  . Alcohol Use: No  . Drug Use: No  . Sexual Activity: Yes    Birth Control/ Protection: None   Other Topics Concern  . Not on file   Social History Narrative   Nsaids and Soybean-containing drug products    Prenatal Transfer Tool  Maternal Diabetes: No Genetic Screening: Normal Maternal Ultrasounds/Referrals: Normal Fetal Ultrasounds or other Referrals:  None Maternal Substance Abuse:  No Significant Maternal Medications:  Meds include: Other: Albuterol, Symbicort Significant Maternal Lab Results: None  Other PNC: uncomplicated except for some asthma exacerbations.  EFW good last US at 34 weeks.    Filed Vitals:   05/09/14 0930  BP: 115/68  Pulse: 78  Temp: 97.9 F (36.6 C)  Resp: 20     Lungs/Cor:  NAD Abdomen:  soft, gravid Ex:  no cords, erythema SVE:  0/100/-2, SROM clear per CNM FHTs:  130s, good STV, NST R Toco:  q 6-10   A/P   Term SROM.  Pitocin for augmentation.  GBS NEG.  Jordanna Hendrie A

## 2014-05-09 NOTE — MAU Note (Signed)
First gush of fluid, clear fluid noted at 0400. No bleeding.  Has passed mucous plug.  Some contractions.

## 2014-05-09 NOTE — MAU Provider Note (Signed)
History     CSN: [id number]  Arrival date and time: 05/09/14 0744   None     Chief Complaint  Patient presents with  . Labor Eval   HPI This is a 28 y.o. female at 3621w6d who presents with c/o leaking fluid this morning. Has some mild contractions.   RN note:  Expand All Collapse All   First gush of fluid, clear fluid noted at 0400. No bleeding. Has passed mucous plug. Some contractions.          OB History    Gravida Para Term Preterm AB TAB SAB Ectopic Multiple Living   1         0      Past Medical History  Diagnosis Date  . Asthma   . Allergy   . Depression   . PTSD (post-traumatic stress disorder)     Past Surgical History  Procedure Laterality Date  . Breast surgery  x 2    breast reduction and lumpectomy    Family History  Problem Relation Age of Onset  . Bipolar disorder Mother   . Cancer Father   . Sickle cell anemia Brother   . Breast cancer Maternal Grandmother   . Diabetes Maternal Grandmother   . Diabetes Maternal Grandfather   . Asthma Mother   . Asthma Sister   . Asthma      nephew  . COPD Maternal Aunt     History  Substance Use Topics  . Smoking status: Former Smoker -- 1.00 packs/day for 8 years    Types: Cigarettes    Quit date: 09/09/2013  . Smokeless tobacco: Not on file  . Alcohol Use: No    Allergies:  Allergies  Allergen Reactions  . Nsaids Anaphylaxis  . Soybean-Containing Drug Products Swelling    Swelling of throat Swelling of lips    Prescriptions prior to admission  Medication Sig Dispense Refill Last Dose  . acetaminophen (TYLENOL) 500 MG tablet Take 1,000 mg by mouth every 6 (six) hours as needed (for pain.).    Past Week at Unknown time  . albuterol (PROVENTIL HFA;VENTOLIN HFA) 108 (90 BASE) MCG/ACT inhaler Inhale 2 puffs into the lungs every 6 (six) hours as needed for wheezing.   Past Week at Unknown time  . albuterol (PROVENTIL) (2.5 MG/3ML) 0.083% nebulizer solution Take 2.5 mg by nebulization  every 6 (six) hours as needed for wheezing or shortness of breath.   Past Week at Unknown time  . budesonide-formoterol (SYMBICORT) 80-4.5 MCG/ACT inhaler Inhale 2 puffs into the lungs daily.   04/02/2014 at Unknown time  . ferrous sulfate 325 (65 FE) MG tablet Take 325 mg by mouth daily with breakfast.   04/02/2014 at Unknown time  . Prenatal Vit-Fe Fumarate-FA (PRENATAL MULTIVITAMIN) TABS tablet Take 1 tablet by mouth daily at 12 noon.   04/02/2014 at Unknown time    Review of Systems  Constitutional: Negative for fever, chills and malaise/fatigue.  Gastrointestinal: Positive for abdominal pain. Negative for nausea and vomiting.  Genitourinary: Negative for dysuria.       Fluid leaking   Physical Exam   Blood pressure 116/64, pulse 85, temperature 97.4 F (36.3 C), temperature source Oral, resp. rate 20, height 5' 4.5" (1.638 m), weight 309 lb (140.161 kg), last menstrual period 07/25/2013, SpO2 97 %.  Physical Exam  Constitutional: She is oriented to person, place, and time. She appears well-developed and well-nourished. No distress.  Cardiovascular: Normal rate.   Respiratory: Effort normal.  GI: Soft.  There is no tenderness. There is no rebound and no guarding.  Genitourinary: Vaginal discharge (Small pooling on spec exam, clear, + ferning) found.  Musculoskeletal: Normal range of motion. She exhibits edema.  Neurological: She is alert and oriented to person, place, and time.  Skin: Skin is warm and dry.  Psychiatric: She has a normal mood and affect.   FHR reactive with a couple of ?small variable decels Irregular mild contractions Dilation: Closed Effacement (%): 100 Cervical Position: Posterior Station: -3 Exam by:: Rae HalstedM. Willams, CNM Bedside US confirmed vertex  MAU Course  Procedures  MDM No results found for this or any previous visit (from the past 24 hour(s)).   Assessment and Plan  A:  SIUP at 363w6d        PROM at term       Latent labor  P:   Admit  WILLIAMS,MARIE 05/09/2014, 8:25 AM

## 2014-05-10 ENCOUNTER — Inpatient Hospital Stay (HOSPITAL_COMMUNITY): Payer: Medicare Other | Admitting: Anesthesiology

## 2014-05-10 ENCOUNTER — Encounter (HOSPITAL_COMMUNITY)
Admission: AD | Disposition: A | Discharge status: Home / Self Care | Payer: Self-pay | Source: Ambulatory Visit | Attending: Obstetrics and Gynecology

## 2014-05-10 ENCOUNTER — Encounter (HOSPITAL_COMMUNITY): Payer: Self-pay | Admitting: Anesthesiology

## 2014-05-10 SURGERY — Surgical Case
Anesthesia: Epidural

## 2014-05-10 MED ORDER — FENTANYL CITRATE 0.05 MG/ML IJ SOLN
INTRAMUSCULAR | Status: AC
  Filled 2014-05-10: qty 2

## 2014-05-10 MED ORDER — ACETAMINOPHEN 325 MG PO TABS
650.0000 mg | ORAL_TABLET | Freq: Four times a day (QID) | ORAL | Status: DC | PRN
Start: 2014-05-10 — End: 2014-05-12
  Administered 2014-05-10: 650 mg via ORAL
  Filled 2014-05-10: qty 2

## 2014-05-10 MED ORDER — NALOXONE HCL 0.4 MG/ML IJ SOLN: 0.4000 mg | INTRAMUSCULAR | Status: DC | PRN

## 2014-05-10 MED ORDER — 0.9 % SODIUM CHLORIDE (POUR BTL) OPTIME
TOPICAL | Status: DC | PRN
  Administered 2014-05-10: 1000 mL

## 2014-05-10 MED ORDER — LACTATED RINGERS IV SOLN
INTRAVENOUS | Status: DC
  Administered 2014-05-10 (×2): via INTRAVENOUS

## 2014-05-10 MED ORDER — MENTHOL 3 MG MT LOZG: 1.0000 | LOZENGE | OROMUCOSAL | Status: DC | PRN

## 2014-05-10 MED ORDER — DIPHENHYDRAMINE HCL 25 MG PO CAPS
25.0000 mg | ORAL_CAPSULE | ORAL | Status: DC | PRN
  Filled 2014-05-10: qty 1

## 2014-05-10 MED ORDER — HYDROMORPHONE HCL 1 MG/ML IJ SOLN
INTRAMUSCULAR | Status: AC
  Administered 2014-05-10: 0.5 mg via INTRAVENOUS
  Filled 2014-05-10: qty 1

## 2014-05-10 MED ORDER — DIPHENHYDRAMINE HCL 50 MG/ML IJ SOLN: 12.5000 mg | INTRAMUSCULAR | Status: DC | PRN

## 2014-05-10 MED ORDER — ONDANSETRON HCL 4 MG PO TABS: 4.0000 mg | ORAL_TABLET | ORAL | Status: DC | PRN

## 2014-05-10 MED ORDER — SODIUM BICARBONATE 8.4 % IV SOLN
INTRAVENOUS | Status: AC
  Filled 2014-05-10: qty 50

## 2014-05-10 MED ORDER — NALBUPHINE HCL 10 MG/ML IJ SOLN
5.0000 mg | INTRAMUSCULAR | Status: DC | PRN
  Filled 2014-05-10: qty 1

## 2014-05-10 MED ORDER — LIDOCAINE-EPINEPHRINE (PF) 2 %-1:200000 IJ SOLN
INTRAMUSCULAR | Status: AC
  Filled 2014-05-10: qty 20

## 2014-05-10 MED ORDER — TETANUS-DIPHTH-ACELL PERTUSSIS 5-2.5-18.5 LF-MCG/0.5 IM SUSP
0.5000 mL | Freq: Once | INTRAMUSCULAR | Status: AC
  Administered 2014-05-11: 0.5 mL via INTRAMUSCULAR

## 2014-05-10 MED ORDER — ALBUTEROL SULFATE (2.5 MG/3ML) 0.083% IN NEBU: 3.0000 mL | INHALATION_SOLUTION | Freq: Four times a day (QID) | RESPIRATORY_TRACT | Status: DC | PRN

## 2014-05-10 MED ORDER — SIMETHICONE 80 MG PO CHEW: 80.0000 mg | CHEWABLE_TABLET | ORAL | Status: DC | PRN

## 2014-05-10 MED ORDER — ONDANSETRON HCL 4 MG/2ML IJ SOLN
INTRAMUSCULAR | Status: DC | PRN
  Administered 2014-05-10: 4 mg via INTRAVENOUS

## 2014-05-10 MED ORDER — NALBUPHINE HCL 10 MG/ML IJ SOLN: 5.0000 mg | Freq: Once | INTRAMUSCULAR | Status: AC | PRN

## 2014-05-10 MED ORDER — ONDANSETRON HCL 4 MG/2ML IJ SOLN
INTRAMUSCULAR | Status: AC
  Filled 2014-05-10: qty 2

## 2014-05-10 MED ORDER — PHENYLEPHRINE 8 MG IN D5W 100 ML (0.08MG/ML) PREMIX OPTIME
INJECTION | INTRAVENOUS | Status: DC | PRN
  Administered 2014-05-10: 60 ug/min via INTRAVENOUS

## 2014-05-10 MED ORDER — SCOPOLAMINE 1 MG/3DAYS TD PT72
1.0000 | MEDICATED_PATCH | Freq: Once | TRANSDERMAL | Status: DC
  Administered 2014-05-10: 1.5 mg via TRANSDERMAL

## 2014-05-10 MED ORDER — CEFAZOLIN SODIUM 10 G IJ SOLR
INTRAMUSCULAR | Status: AC
  Filled 2014-05-10: qty 3000

## 2014-05-10 MED ORDER — OXYTOCIN 10 UNIT/ML IJ SOLN
INTRAMUSCULAR | Status: AC
  Filled 2014-05-10: qty 4

## 2014-05-10 MED ORDER — SODIUM BICARBONATE 8.4 % IV SOLN
INTRAVENOUS | Status: DC | PRN
  Administered 2014-05-10: 3 mL via EPIDURAL
  Administered 2014-05-10: 10 mL via EPIDURAL
  Administered 2014-05-10 (×3): 5 mL via EPIDURAL

## 2014-05-10 MED ORDER — ONDANSETRON HCL 4 MG/2ML IJ SOLN: 4.0000 mg | Freq: Three times a day (TID) | INTRAMUSCULAR | Status: DC | PRN

## 2014-05-10 MED ORDER — ONDANSETRON HCL 4 MG/2ML IJ SOLN: 4.0000 mg | INTRAMUSCULAR | Status: DC | PRN

## 2014-05-10 MED ORDER — ALBUTEROL SULFATE HFA 108 (90 BASE) MCG/ACT IN AERS
INHALATION_SPRAY | RESPIRATORY_TRACT | Status: AC
  Filled 2014-05-10: qty 6.7

## 2014-05-10 MED ORDER — DIPHENHYDRAMINE HCL 25 MG PO CAPS: 25.0000 mg | ORAL_CAPSULE | Freq: Four times a day (QID) | ORAL | Status: DC | PRN

## 2014-05-10 MED ORDER — OXYTOCIN 40 UNITS IN LACTATED RINGERS INFUSION - SIMPLE MED: 62.5000 mL/h | INTRAVENOUS | Status: AC

## 2014-05-10 MED ORDER — SCOPOLAMINE 1 MG/3DAYS TD PT72
MEDICATED_PATCH | TRANSDERMAL | Status: AC
  Filled 2014-05-10: qty 1

## 2014-05-10 MED ORDER — DIBUCAINE 1 % RE OINT: 1.0000 "application " | TOPICAL_OINTMENT | RECTAL | Status: DC | PRN

## 2014-05-10 MED ORDER — LACTATED RINGERS IV SOLN
INTRAVENOUS | Status: DC
  Administered 2014-05-10: 300 mL via INTRAUTERINE

## 2014-05-10 MED ORDER — CHLOROPROCAINE HCL 3 % IJ SOLN
INTRAMUSCULAR | Status: DC | PRN
  Administered 2014-05-10: 20 mL

## 2014-05-10 MED ORDER — SENNOSIDES-DOCUSATE SODIUM 8.6-50 MG PO TABS
2.0000 | ORAL_TABLET | ORAL | Status: DC
  Administered 2014-05-11 – 2014-05-12 (×2): 2 via ORAL
  Filled 2014-05-10 (×2): qty 2

## 2014-05-10 MED ORDER — SIMETHICONE 80 MG PO CHEW
80.0000 mg | CHEWABLE_TABLET | Freq: Three times a day (TID) | ORAL | Status: DC
  Administered 2014-05-11 – 2014-05-12 (×6): 80 mg via ORAL
  Filled 2014-05-10 (×3): qty 1

## 2014-05-10 MED ORDER — MORPHINE SULFATE (PF) 0.5 MG/ML IJ SOLN
INTRAMUSCULAR | Status: DC | PRN
  Administered 2014-05-10: 2000 ug via EPIDURAL
  Administered 2014-05-10: 3000 ug via EPIDURAL

## 2014-05-10 MED ORDER — OXYCODONE-ACETAMINOPHEN 5-325 MG PO TABS
1.0000 | ORAL_TABLET | ORAL | Status: DC | PRN
  Administered 2014-05-12: 1 via ORAL

## 2014-05-10 MED ORDER — LACTATED RINGERS IV SOLN
INTRAVENOUS | Status: DC | PRN
  Administered 2014-05-10: 10:00:00 via INTRAVENOUS

## 2014-05-10 MED ORDER — ZOLPIDEM TARTRATE 5 MG PO TABS: 5.0000 mg | ORAL_TABLET | Freq: Every evening | ORAL | Status: DC | PRN

## 2014-05-10 MED ORDER — MORPHINE SULFATE 0.5 MG/ML IJ SOLN
INTRAMUSCULAR | Status: AC
  Filled 2014-05-10: qty 10

## 2014-05-10 MED ORDER — HYDROMORPHONE HCL 1 MG/ML IJ SOLN
0.2500 mg | INTRAMUSCULAR | Status: DC | PRN
  Administered 2014-05-10 (×3): 0.5 mg via INTRAVENOUS

## 2014-05-10 MED ORDER — NALBUPHINE HCL 10 MG/ML IJ SOLN
5.0000 mg | INTRAMUSCULAR | Status: DC | PRN
  Administered 2014-05-11: 5 mg via SUBCUTANEOUS
  Filled 2014-05-10: qty 1

## 2014-05-10 MED ORDER — SODIUM CHLORIDE 0.9 % IJ SOLN: 3.0000 mL | INTRAMUSCULAR | Status: DC | PRN

## 2014-05-10 MED ORDER — LIDOCAINE HCL (PF) 1 % IJ SOLN
INTRAMUSCULAR | Status: DC | PRN
  Administered 2014-05-10 (×2): 9 mL

## 2014-05-10 MED ORDER — FENTANYL 2.5 MCG/ML BUPIVACAINE 1/10 % EPIDURAL INFUSION (WH - ANES)
INTRAMUSCULAR | Status: DC | PRN
  Administered 2014-05-10: 14 mL/h via EPIDURAL

## 2014-05-10 MED ORDER — SIMETHICONE 80 MG PO CHEW
80.0000 mg | CHEWABLE_TABLET | ORAL | Status: DC
  Administered 2014-05-11: 80 mg via ORAL
  Filled 2014-05-10 (×2): qty 1

## 2014-05-10 MED ORDER — DEXTROSE 5 % IV SOLN
3.0000 g | Freq: Three times a day (TID) | INTRAVENOUS | Status: DC
  Administered 2014-05-10: 3 g via INTRAVENOUS
  Filled 2014-05-10 (×5): qty 3000

## 2014-05-10 MED ORDER — MEPERIDINE HCL 25 MG/ML IJ SOLN: 6.2500 mg | INTRAMUSCULAR | Status: DC | PRN

## 2014-05-10 MED ORDER — BUDESONIDE-FORMOTEROL FUMARATE 80-4.5 MCG/ACT IN AERO
2.0000 | INHALATION_SPRAY | Freq: Every day | RESPIRATORY_TRACT | Status: DC
Start: 2014-05-10 — End: 2014-05-12
  Administered 2014-05-11 – 2014-05-12 (×2): 2 via RESPIRATORY_TRACT
  Filled 2014-05-10: qty 6.9

## 2014-05-10 MED ORDER — OXYCODONE-ACETAMINOPHEN 5-325 MG PO TABS
2.0000 | ORAL_TABLET | ORAL | Status: DC | PRN
  Administered 2014-05-11 – 2014-05-12 (×8): 2 via ORAL
  Filled 2014-05-10 (×10): qty 2

## 2014-05-10 MED ORDER — WITCH HAZEL-GLYCERIN EX PADS: 1.0000 "application " | MEDICATED_PAD | CUTANEOUS | Status: DC | PRN

## 2014-05-10 MED ORDER — OXYTOCIN 10 UNIT/ML IJ SOLN
40.0000 [IU] | INTRAVENOUS | Status: DC | PRN
  Administered 2014-05-10: 40 [IU] via INTRAVENOUS

## 2014-05-10 MED ORDER — LANOLIN HYDROUS EX OINT: 1.0000 "application " | TOPICAL_OINTMENT | CUTANEOUS | Status: DC | PRN

## 2014-05-10 MED ORDER — NALOXONE HCL 1 MG/ML IJ SOLN
1.0000 ug/kg/h | INTRAMUSCULAR | Status: DC | PRN
  Filled 2014-05-10: qty 2

## 2014-05-10 SURGICAL SUPPLY — 31 items
CLAMP CORD UMBIL (MISCELLANEOUS) IMPLANT
CLOTH BEACON ORANGE TIMEOUT ST (SAFETY) ×3 IMPLANT
DERMABOND ADHESIVE PROPEN (GAUZE/BANDAGES/DRESSINGS) ×2
DERMABOND ADVANCED .7 DNX6 (GAUZE/BANDAGES/DRESSINGS) ×1 IMPLANT
DRAPE SHEET LG 3/4 BI-LAMINATE (DRAPES) IMPLANT
DRSG OPSITE POSTOP 4X10 (GAUZE/BANDAGES/DRESSINGS) ×3 IMPLANT
DURAPREP 26ML APPLICATOR (WOUND CARE) ×3 IMPLANT
ELECT REM PT RETURN 9FT ADLT (ELECTROSURGICAL) ×3
ELECTRODE REM PT RTRN 9FT ADLT (ELECTROSURGICAL) ×1 IMPLANT
EXTRACTOR VACUUM M CUP 4 TUBE (SUCTIONS) IMPLANT
EXTRACTOR VACUUM M CUP 4' TUBE (SUCTIONS)
GLOVE BIO SURGEON STRL SZ7 (GLOVE) ×3 IMPLANT
GOWN STRL REUS W/TWL LRG LVL3 (GOWN DISPOSABLE) ×6 IMPLANT
KIT ABG SYR 3ML LUER SLIP (SYRINGE) IMPLANT
LIQUID BAND (GAUZE/BANDAGES/DRESSINGS) IMPLANT
NEEDLE HYPO 25X5/8 SAFETYGLIDE (NEEDLE) IMPLANT
NS IRRIG 1000ML POUR BTL (IV SOLUTION) ×3 IMPLANT
PACK C SECTION WH (CUSTOM PROCEDURE TRAY) ×3 IMPLANT
PAD OB MATERNITY 4.3X12.25 (PERSONAL CARE ITEMS) ×3 IMPLANT
RTRCTR C-SECT PINK 25CM LRG (MISCELLANEOUS) ×3 IMPLANT
STAPLER VISISTAT 35W (STAPLE) IMPLANT
SUT CHROMIC 1 CTX 36 (SUTURE) ×9 IMPLANT
SUT CHROMIC 2 0 CT 1 (SUTURE) ×3 IMPLANT
SUT PDS AB 0 CTX 60 (SUTURE) ×3 IMPLANT
SUT PLAIN 2 0 XLH (SUTURE) ×3 IMPLANT
SUT VIC AB 2-0 CT1 27 (SUTURE) ×2
SUT VIC AB 2-0 CT1 TAPERPNT 27 (SUTURE) ×1 IMPLANT
SUT VIC AB 4-0 KS 27 (SUTURE) IMPLANT
TOWEL OR 17X24 6PK STRL BLUE (TOWEL DISPOSABLE) ×3 IMPLANT
TRAY FOLEY CATH 14FR (SET/KITS/TRAYS/PACK) ×3 IMPLANT
WATER STERILE IRR 1000ML POUR (IV SOLUTION) ×3 IMPLANT

## 2014-05-10 NOTE — Op Note (Signed)
Pre-Operative Diagnosis: 1) 40+0 week Intrauterine pregnancy 2) Morbid obesity 3) Non-reassuring fetal well being with patient remote from delivery Postoperative Diagnosis: 1) Same Procedure: Primary low transverse incision Surgeon: Dr. Waynard ReedsKendra Mishael Aguirre Assistant: None Operative Findings: Vigorous female infant in the vertex occiput posterior presentation. Limited visualization of ovaries and tubes. Apgars 9 & 9. Infant weighed 7#14.3 oz Specimen: Placenta for disposal EBL: 1000cc   Procedure:[name] is an 28 year old gravida 1 para 0 at 40 weeks and 0 days estimated gestational age who presents for cesarean section.Pt began having severe variable decelerations once amniotomy performed. Amnioinfusion started and pitocin d/cd. Pt started having late decelerations with every contraction. No cervical change. Pitocin still off, and pt having weak contractions Q 5 minutes with persistent late decelerations. Given this and the fact that the patient was remote from delivery the decision was made to proceed with cesarean.   Following the appropriate informed consent the patient was brought to the operating room where spinal anesthesia was administered and found to be adequate. She was placed in the dorsal supine position with a leftward tilt. She was prepped and draped in the normal sterile fashion. Scalpel was then used to make a Pfannenstiel skin incision which was carried down to the underlying layers of soft tissue to the fascia. The fascia was incised in the midline and the fascial incision was extended laterally with Mayo scissors. The superior aspect of the fascial incision was grasped with Coker clamps x2, tented up and the rectus muscles dissected off sharply with the electrocautery unit area and the same procedure was repeated on the inferior aspect of the fascial incision. The rectus muscles were separated in the midline. The abdominal peritoneum was identified, tented up, entered sharply, and the incision  was extended superiorly and inferiorly with good visualization of the bladder. The Alexis retractor was then deployed. The vesicouterine peritoneum was identified, tented up, entered sharply, and the bladder flap was created digitally. Scalpel was then used to make a low transverse incision on the uterus which was extended laterally with both blunt dissection and the bandage scissors. The fetal vertex was identified, delivered easily through the uterine incision followed by the body. The infant was bulb suctioned on the operative field cried vigorously, cord was clamped and cut and the infant was passed to the waiting neonatologist. Placenta was then delivered spontaneously, the uterus was cleared of all clot and debris. The uterine incision was repaired with #1 chromic in running locked fashion followed by a second imbricating layer. The Alexis retractor was removed. The abdominal peritoneum was reapproximated with 2-0 Vicryl in a running fashion, the rectus muscles was reapproximated with #1 chromic in a running fashion. The fascia was closed with a looped PDS in a running fashion. The subcutaneous tissue was reapproximated with 3-0 plain gut. The skin was closed with 4-0 vicryl in a subcuticular fashion and surgical skin glue. All sponge lap and needle counts were correct x2. Patient tolerated the procedure well and recovered in stable condition following the procedure.

## 2014-05-10 NOTE — Consult Note (Signed)
Neonatology Note:   Attendance at C-section:    I was asked by Dr. Tenny Crawoss to attend this primary C/S at term due to fetal intolerance to labor. The mother is a G1P0 O pos, GBS neg with bipolar disorder. ROM 30 hours prior to delivery, fluid clear. Infant vigorous with good spontaneous cry and tone. Needed bulb suctioning for very light green fluid. Ap 9/9. Lungs clear to ausc in DR. To CN to care of Pediatrician.   Doretha Souhristie C. Anais Denslow, MD

## 2014-05-10 NOTE — Anesthesia Preprocedure Evaluation (Signed)
Anesthesia Evaluation  Patient identified by MRN, date of birth, ID band Patient awake    Reviewed: Allergy & Precautions, H&P , NPO status , Patient's Chart, lab work & pertinent test results  Airway Mallampati: III  TM Distance: >3 FB Neck ROM: full    Dental no notable dental hx.    Pulmonary former smoker,    Pulmonary exam normal       Cardiovascular negative cardio ROS      Neuro/Psych    GI/Hepatic Neg liver ROS,   Endo/Other  Morbid obesity  Renal/GU negative Renal ROS     Musculoskeletal   Abdominal (+) + obese,   Peds  Hematology negative hematology ROS (+)   Anesthesia Other Findings   Reproductive/Obstetrics (+) Pregnancy                             Anesthesia Physical Anesthesia Plan  ASA: III  Anesthesia Plan: Epidural   Post-op Pain Management:    Induction:   Airway Management Planned:   Additional Equipment:   Intra-op Plan:   Post-operative Plan:   Informed Consent: I have reviewed the patients History and Physical, chart, labs and discussed the procedure including the risks, benefits and alternatives for the proposed anesthesia with the patient or authorized representative who has indicated his/her understanding and acceptance.     Plan Discussed with:   Anesthesia Plan Comments:         Anesthesia Quick Evaluation

## 2014-05-10 NOTE — Anesthesia Postprocedure Evaluation (Signed)
  Anesthesia Post-op Note  Patient: Shannon Aguirre  Procedure(s) Performed: Procedure(s): CESAREAN SECTION (N/A)  Patient Location: PACU and Mother/Baby  Anesthesia Type:Epidural  Level of Consciousness: awake, alert , oriented and patient cooperative  Airway and Oxygen Therapy: Patient Spontanous Breathing  Post-op Pain: mild  Post-op Assessment: Post-op Vital signs reviewed, Pain level controlled, No headache, No backache, No residual numbness and No residual motor weakness  Post-op Vital Signs: Reviewed and stable  Last Vitals:  Filed Vitals:   05/10/14 2120  BP: 104/54  Pulse: 69  Temp: 36.7 C  Resp: 18    Complications: No apparent anesthesia complications

## 2014-05-10 NOTE — Anesthesia Procedure Notes (Signed)
Epidural Patient location during procedure: OB Start time: 05/10/2014 3:36 AM End time: 05/10/2014 3:45 AM  Staffing Anesthesiologist: Leilani AbleHATCHETT, Aydden Cumpian Performed by: anesthesiologist   Preanesthetic Checklist Completed: patient identified, surgical consent, pre-op evaluation, timeout performed, IV checked, risks and benefits discussed and monitors and equipment checked  Epidural Patient position: sitting Prep: site prepped and draped and DuraPrep Patient monitoring: continuous pulse ox and blood pressure Approach: midline Location: L3-L4 Injection technique: LOR air  Needle:  Needle type: Tuohy  Needle gauge: 17 G Needle length: 9 cm and 9 Needle insertion depth: 8 cm Catheter type: closed end flexible Catheter size: 19 Gauge Catheter at skin depth: 14 cm Test dose: negative and Other  Assessment Sensory level: T9 Events: blood not aspirated, injection not painful, no injection resistance, negative IV test and no paresthesia  Additional Notes Reason for block:procedure for pain

## 2014-05-10 NOTE — Transfer of Care (Signed)
Immediate Anesthesia Transfer of Care Note  Patient: Shannon Aguirre  Procedure(s) Performed: Procedure(s): CESAREAN SECTION (N/A)  Patient Location: PACU  Anesthesia Type:Epidural  Level of Consciousness: awake, alert  and oriented  Airway & Oxygen Therapy: Patient Spontanous Breathing  Post-op Assessment: Report given to PACU RN and Post -op Vital signs reviewed and stable  Post vital signs: Reviewed and stable  Complications: No apparent anesthesia complications

## 2014-05-10 NOTE — Progress Notes (Signed)
Amniotomy performed and IUPC w/ FSE placed earlier this morning Pt began having severe variable decelerations once amniotomy performed. Amnioinfusion started and pitocin d/cd. Pt has now started having late decelerations with every contraction. No cervical change. Pitocin still off, and pt having weak contractions Q 5 minutes with persistent late decelerations. Discussed with patient and family and advised of need for cesarean section. R/B/A reviewed, consent obtained. Will proceed

## 2014-05-11 LAB — CBC
HCT: 27.9 % — ABNORMAL LOW (ref 36.0–46.0)
HEMOGLOBIN: 9.2 g/dL — AB (ref 12.0–15.0)
MCH: 28.5 pg (ref 26.0–34.0)
MCHC: 33.3 g/dL (ref 30.0–36.0)
MCV: 85.6 fL (ref 78.0–100.0)
PLATELETS: 189 10*3/uL (ref 150–400)
RBC: 3.26 MIL/uL — AB (ref 3.87–5.11)
RDW: 14.6 % (ref 11.5–15.5)
WBC: 14 10*3/uL — ABNORMAL HIGH (ref 4.0–10.5)

## 2014-05-11 LAB — CCBB MATERNAL DONOR DRAW

## 2014-05-11 MED ORDER — MIDAZOLAM HCL 2 MG/2ML IJ SOLN
INTRAMUSCULAR | Status: AC
  Filled 2014-05-11: qty 2

## 2014-05-11 MED ORDER — OXYCODONE HCL 5 MG PO TABS
5.0000 mg | ORAL_TABLET | ORAL | Status: DC | PRN
  Administered 2014-05-11: 5 mg via ORAL
  Administered 2014-05-12 (×2): 10 mg via ORAL
  Filled 2014-05-11 (×2): qty 2
  Filled 2014-05-11: qty 1

## 2014-05-11 MED ORDER — DEXAMETHASONE SODIUM PHOSPHATE 10 MG/ML IJ SOLN
INTRAMUSCULAR | Status: AC
  Filled 2014-05-11: qty 1

## 2014-05-11 MED ORDER — FENTANYL CITRATE 0.05 MG/ML IJ SOLN
INTRAMUSCULAR | Status: AC
  Filled 2014-05-11: qty 5

## 2014-05-11 NOTE — Progress Notes (Signed)
Subjective: Postpartum Day 1: Cesarean Delivery Patient reports doing well, pain controlled, nausea resolved, voiding without difficult. No flatus yet  Objective: Vital signs in last 24 hours: Temp:  [97.9 F (36.6 C)-99.1 F (37.3 C)] 98.3 F (36.8 C) (12/12 0930) Pulse Rate:  [64-92] 92 (12/12 0930) Resp:  [15-18] 18 (12/12 0930) BP: (102-118)/(39-76) 118/59 mmHg (12/12 0930) SpO2:  [95 %-98 %] 98 % (12/12 0930)  Physical Exam:  General: alert, cooperative and appears stated age 60Lochia: appropriate Uterine Fundus: firm Incision: healing well, honeycomb dressing without soilage DVT Evaluation: No evidence of DVT seen on physical exam.   Recent Labs  05/09/14 0950 05/11/14 0623  HGB 11.3* 9.2*  HCT 34.3* 27.9*    Assessment/Plan: Status post Cesarean section. Doing well postoperatively.  Continue current care. Desires circ, will do in office  Kloe Oates H. 05/11/2014, 12:20 PM

## 2014-05-12 MED ORDER — DOCUSATE SODIUM 100 MG PO CAPS: 100.0000 mg | ORAL_CAPSULE | Freq: Two times a day (BID) | ORAL | Status: DC

## 2014-05-12 MED ORDER — OXYCODONE-ACETAMINOPHEN 5-325 MG PO TABS: 1.0000 | ORAL_TABLET | ORAL | Status: DC | PRN

## 2014-05-12 MED ORDER — SIMETHICONE 80 MG PO CHEW: 80.0000 mg | CHEWABLE_TABLET | Freq: Four times a day (QID) | ORAL | Status: DC | PRN

## 2014-05-12 MED ORDER — INFLUENZA VAC SPLIT QUAD 0.5 ML IM SUSY
0.5000 mL | PREFILLED_SYRINGE | INTRAMUSCULAR | Status: AC
  Administered 2014-05-12: 0.5 mL via INTRAMUSCULAR

## 2014-05-12 MED ORDER — OXYCODONE HCL 5 MG PO TABS: ORAL_TABLET | ORAL | Status: DC

## 2014-05-12 NOTE — Discharge Summary (Signed)
Obstetric Discharge Summary Reason for Admission: rupture of membranes Prenatal Procedures: NST and ultrasound Intrapartum Procedures: cesarean: low cervical, transverse Postpartum Procedures: none Complications-Operative and Postpartum: none HEMOGLOBIN  Date Value Ref Range Status  05/11/2014 9.2* 12.0 - 15.0 g/dL Final    Comment:    REPEATED TO VERIFY DELTA CHECK NOTED   06/23/2013 12.2 12.2 - 16.2 g/dL Final   HCT  Date Value Ref Range Status  05/11/2014 27.9* 36.0 - 46.0 % Final   HCT, POC  Date Value Ref Range Status  06/23/2013 40.1 37.7 - 47.9 % Final    Physical Exam:  General: alert, cooperative and appears stated age 29Lochia: appropriate Uterine Fundus: firm Incision: healing well DVT Evaluation: No evidence of DVT seen on physical exam.  Discharge Diagnoses: Term Pregnancy-delivered  Discharge Information: Date: 05/12/2014 Activity: pelvic rest Diet: routine Medications: Colace, Percocet and oxycodone for breakthrough pain Condition: improved Instructions: refer to practice specific booklet Discharge to: home Follow-up Information    Follow up with Almon HerculesOSS,Fadil Macmaster H., MD In 4 weeks.   Specialty:  Obstetrics and Gynecology   Why:  for a postoperative evaluation   Contact information:   [address] [place] [address] [phone]       Newborn Data: Live born female  Birth Weight: 7 lb 14.3 oz (3580 g) APGAR: 9, 9  Home with mother.  Dmauri Rosenow H. 05/12/2014, 12:07 PM

## 2014-05-12 NOTE — Progress Notes (Signed)
See baby's Chart for social work consult for mom.

## 2014-05-12 NOTE — Progress Notes (Addendum)
Pt has been walking the hallways and walking in the room. Patient has not passed gas. Patient's pain level is 3.

## 2014-05-12 NOTE — Progress Notes (Signed)
During report Patient stated that her pain was increasing and that the percocet was not working. Rn called Dr. Tenny Crawoss who gave an order for oxycodone every 4 hours 5 to 10 mg  For break through pain.

## 2014-05-12 NOTE — Progress Notes (Addendum)
Rn called  Dr. Tenny Crawoss at [phone] am regarding patient's pain level. Patient had just woken up and gone to the restroom. Patient was crying stating that the pain medicine was not working. Earlier the patient stated that the pain medicine only lasted for a short time and an order for oxycodone was obtained.

## 2014-05-13 ENCOUNTER — Encounter (HOSPITAL_COMMUNITY): Payer: Self-pay | Admitting: Obstetrics and Gynecology

## 2014-05-23 ENCOUNTER — Encounter (HOSPITAL_COMMUNITY): Payer: Self-pay | Admitting: Obstetrics and Gynecology

## 2014-05-23 NOTE — Addendum Note (Signed)
Addendum  created 05/23/14 1933 by Cristela BlueKyle Kosisochukwu Burningham, MD   Modules edited: Anesthesia Attestations, Anesthesia Events, Narrator   Narrator:  Narrator: Event Log Edited

## 2014-10-31 LAB — OB RESULTS CONSOLE ABO/RH: RH TYPE: POSITIVE

## 2014-10-31 LAB — OB RESULTS CONSOLE RUBELLA ANTIBODY, IGM: Rubella: IMMUNE

## 2014-10-31 LAB — OB RESULTS CONSOLE ANTIBODY SCREEN: Antibody Screen: NEGATIVE

## 2014-10-31 LAB — OB RESULTS CONSOLE HIV ANTIBODY (ROUTINE TESTING): HIV: NONREACTIVE

## 2014-10-31 LAB — OB RESULTS CONSOLE HEPATITIS B SURFACE ANTIGEN: HEP B S AG: NEGATIVE

## 2014-11-01 LAB — OB RESULTS CONSOLE GC/CHLAMYDIA
Chlamydia: NEGATIVE
Gonorrhea: NEGATIVE

## 2015-01-07 ENCOUNTER — Encounter (HOSPITAL_COMMUNITY): Payer: Self-pay | Admitting: *Deleted

## 2015-01-07 ENCOUNTER — Inpatient Hospital Stay (HOSPITAL_COMMUNITY)
Admission: AD | Admit: 2015-01-07 | Discharge: 2015-01-07 | Disposition: A | Discharge status: Home / Self Care | Payer: Medicare Other | Source: Ambulatory Visit | Attending: Obstetrics and Gynecology | Admitting: Obstetrics and Gynecology

## 2015-01-07 DIAGNOSIS — O210 Mild hyperemesis gravidarum: Secondary | ICD-10-CM | POA: Diagnosis present

## 2015-01-07 DIAGNOSIS — O219 Vomiting of pregnancy, unspecified: Secondary | ICD-10-CM

## 2015-01-07 DIAGNOSIS — Z87891 Personal history of nicotine dependence: Secondary | ICD-10-CM | POA: Diagnosis not present

## 2015-01-07 DIAGNOSIS — Z3A18 18 weeks gestation of pregnancy: Secondary | ICD-10-CM | POA: Diagnosis not present

## 2015-01-07 LAB — COMPREHENSIVE METABOLIC PANEL
ALBUMIN: 3.2 g/dL — AB (ref 3.5–5.0)
ALT: 13 U/L — ABNORMAL LOW (ref 14–54)
AST: 13 U/L — ABNORMAL LOW (ref 15–41)
Alkaline Phosphatase: 75 U/L (ref 38–126)
Anion gap: 8 (ref 5–15)
BUN: 6 mg/dL (ref 6–20)
CALCIUM: 9.1 mg/dL (ref 8.9–10.3)
CO2: 25 mmol/L (ref 22–32)
Chloride: 106 mmol/L (ref 101–111)
Creatinine, Ser: 0.51 mg/dL (ref 0.44–1.00)
GFR calc non Af Amer: >60 mL/min (ref >60)
Glucose, Bld: 83 mg/dL (ref 65–99)
POTASSIUM: 4 mmol/L (ref 3.5–5.1)
Sodium: 139 mmol/L (ref 135–145)
Total Bilirubin: 0.1 mg/dL — ABNORMAL LOW (ref 0.3–1.2)
Total Protein: 6.9 g/dL (ref 6.5–8.1)

## 2015-01-07 LAB — URINALYSIS, ROUTINE W REFLEX MICROSCOPIC
Bilirubin Urine: NEGATIVE
Glucose, UA: NEGATIVE mg/dL
HGB URINE DIPSTICK: NEGATIVE
Ketones, ur: NEGATIVE mg/dL
LEUKOCYTES UA: NEGATIVE
Nitrite: NEGATIVE
Protein, ur: NEGATIVE mg/dL
UROBILINOGEN UA: 0.2 mg/dL (ref 0.0–1.0)
pH: 5.5 (ref 5.0–8.0)

## 2015-01-07 MED ORDER — FAMOTIDINE IN NACL 20-0.9 MG/50ML-% IV SOLN
20.0000 mg | Freq: Once | INTRAVENOUS | Status: AC
  Administered 2015-01-07: 20 mg via INTRAVENOUS
  Filled 2015-01-07: qty 50

## 2015-01-07 MED ORDER — PROMETHAZINE HCL 25 MG/ML IJ SOLN
25.0000 mg | Freq: Once | INTRAVENOUS | Status: AC
  Administered 2015-01-07: 25 mg via INTRAVENOUS
  Filled 2015-01-07: qty 1

## 2015-01-07 NOTE — MAU Note (Signed)
Has been vomiting, "severe today", has thrown up 4 times.  Was vomiting yesterday also.  Not keeping food or water down.  Took some Tylenol for a headache this morning, didn't help

## 2015-01-07 NOTE — Progress Notes (Signed)
Patient states that her headache is much improved.

## 2015-01-07 NOTE — MAU Provider Note (Signed)
History     CSN: [id number]  Arrival date and time: 01/07/15 1102   First Provider Initiated Contact with Patient 01/07/15 1142       Chief Complaint  Patient presents with  . Emesis   HPI  Shannon Aguirre is a 29 y.o. G2P1001 at [redacted]w[redacted]d  who presents with vomiting. States has had "morning sickness" since beginning of pregnancy. This episode worsened over the last 2 days & has been unable to keep down food or fluids.  Was rx phenergan & diclegis; states they don't work so she hasn't taken them in the last month.  Has vomited 4 times today & twice yesterday; all after eating or drinking.  Upper abdominal pain; describes as achy, rates 5/10; pain worse with vomiting.  Headache; since yesterday when she started vomiting; took tylenol  this morning with no relief but believes she vomited shortly after taking tylenol. Rates 7/10; frontal, throbbing,photophobia; has hx of headaches similar to this.   Denies diarrhea, abdominal cramping, vaginal bleeding, fever.  Last BM 2 days ago; states this is normal for her.    Past Medical History  Diagnosis Date  . Asthma   . Allergy   . Depression   . PTSD (post-traumatic stress disorder)   . Headache   . Dissociative identity disorder   . GERD (gastroesophageal reflux disease)     Past Surgical History  Procedure Laterality Date  . Breast surgery  x 2    breast reduction and lumpectomy  . Cesarean section N/A 05/10/2014    Procedure: CESAREAN SECTION;  Surgeon: Freddrick March. Tenny Craw, MD;  Location: [facility];  Service: Obstetrics;  Laterality: N/A;    Family History  Problem Relation Age of Onset  . Bipolar disorder Mother   . Cancer Father   . Sickle cell anemia Brother   . Breast cancer Maternal Grandmother   . Diabetes Maternal Grandmother   . Diabetes Maternal Grandfather   . Asthma Mother   . Asthma Sister   . Asthma      nephew  . COPD Maternal Aunt     History  Substance Use Topics  . Smoking status: Former Smoker -- 1.00  packs/day for 8 years    Types: Cigarettes    Quit date: 09/09/2013  . Smokeless tobacco: Not on file  . Alcohol Use: No    Allergies:  Allergies  Allergen Reactions  . Nsaids Anaphylaxis  . Soybean-Containing Drug Products Swelling    Swelling of throat Swelling of lips    Prescriptions prior to admission  Medication Sig Dispense Refill Last Dose  . albuterol (PROVENTIL HFA;VENTOLIN HFA) 108 (90 BASE) MCG/ACT inhaler Inhale 2 puffs into the lungs every 6 (six) hours as needed for wheezing.   rescue  . albuterol (PROVENTIL) (2.5 MG/3ML) 0.083% nebulizer solution Take 2.5 mg by nebulization every 6 (six) hours as needed for wheezing or shortness of breath.   rescue  . budesonide-formoterol (SYMBICORT) 80-4.5 MCG/ACT inhaler Inhale 2 puffs into the lungs daily.   05/08/2014 at Unknown time  . docusate sodium (COLACE) 100 MG capsule Take 1 capsule (100 mg total) by mouth 2 (two) times daily. 60 capsule 0   . oxyCODONE (ROXICODONE) 5 MG immediate release tablet Take 1-2 tablets every 4 hours as needed for breakthrough pain 60 tablet 0   . oxyCODONE-acetaminophen (ROXICET) 5-325 MG per tablet Take 1-2 tablets by mouth every 4 (four) hours as needed for severe pain. 60 tablet 0   . Prenatal Vit-Fe Fumarate-FA (PRENATAL MULTIVITAMIN)  TABS tablet Take 1 tablet by mouth daily at 12 noon.   05/09/2014 at Unknown time  . simethicone (GAS-X) 80 MG chewable tablet Chew 1 tablet (80 mg total) by mouth every 6 (six) hours as needed for flatulence. 30 tablet 0     Review of Systems  Constitutional: Negative.   HENT: Negative for congestion, hearing loss and tinnitus.   Eyes: Positive for photophobia.  Respiratory: Negative.   Cardiovascular: Negative.   Gastrointestinal: Positive for nausea, vomiting and abdominal pain. Negative for heartburn, diarrhea and constipation.  Genitourinary: Negative.   Musculoskeletal: Negative for back pain.  Neurological: Negative.    Physical Exam   Blood  pressure 111/54, pulse 80, temperature 98.2 F (36.8 C), temperature source Oral, resp. rate 18, height 5' 4.5" (1.638 m), weight 138.166 kg (304 lb 9.6 oz), unknown if currently breastfeeding.  Physical Exam  Nursing note and vitals reviewed. Constitutional: She is oriented to person, place, and time. She appears well-developed and well-nourished. No distress.  HENT:  Head: Normocephalic and atraumatic.  Eyes: Conjunctivae are normal.  Neck: Normal range of motion. Neck supple.  Cardiovascular: Normal rate, regular rhythm and normal heart sounds.  Exam reveals no gallop and no friction rub.   No murmur heard. Respiratory: Effort normal and breath sounds normal. No respiratory distress. She has no wheezes.  GI: Soft. Bowel sounds are normal. She exhibits no distension. There is no tenderness. There is no rebound and no guarding.  Neurological: She is alert and oriented to person, place, and time.  Skin: Skin is warm and dry. She is not diaphoretic.  Psychiatric: She has a normal mood and affect. Her behavior is normal. Judgment and thought content normal.    MAU Course  Procedures Results for orders placed or performed during the hospital encounter of 01/07/15 (from the past 24 hour(s))  Urinalysis, Routine w reflex microscopic (not at [facility])     Status: Abnormal   Collection Time: 01/07/15 11:15 AM  Result Value Ref Range   Color, Urine YELLOW YELLOW   APPearance CLEAR CLEAR   Specific Gravity, Urine <1.005 (L) 1.005 - 1.030   pH 5.5 5.0 - 8.0   Glucose, UA NEGATIVE NEGATIVE mg/dL   Hgb urine dipstick NEGATIVE NEGATIVE   Bilirubin Urine NEGATIVE NEGATIVE   Ketones, ur NEGATIVE NEGATIVE mg/dL   Protein, ur NEGATIVE NEGATIVE mg/dL   Urobilinogen, UA 0.2 0.0 - 1.0 mg/dL   Nitrite NEGATIVE NEGATIVE   Leukocytes, UA NEGATIVE NEGATIVE  Comprehensive metabolic panel     Status: Abnormal   Collection Time: 01/07/15 12:08 PM  Result Value Ref Range   Sodium 139 135 - 145 mmol/L    Potassium 4.0 3.5 - 5.1 mmol/L   Chloride 106 101 - 111 mmol/L   CO2 25 22 - 32 mmol/L   Glucose, Bld 83 65 - 99 mg/dL   BUN 6 6 - 20 mg/dL   Creatinine, Ser 1.61 0.44 - 1.00 mg/dL   Calcium 9.1 8.9 - 09.6 mg/dL   Total Protein 6.9 6.5 - 8.1 g/dL   Albumin 3.2 (L) 3.5 - 5.0 g/dL   AST 13 (L) 15 - 41 U/L   ALT 13 (L) 14 - 54 U/L   Alkaline Phosphatase 75 38 - 126 U/L   Total Bilirubin 0.1 (L) 0.3 - 1.2 mg/dL   GFR calc non Af Amer >60 >60 mL/min   GFR calc Af Amer >60 >60 mL/min   Anion gap 8 5 - 15  MDM FHT 150 by doppler IV fluids with phenergan & pepcid IVPB Headache & nausea resolved with medication Pt did not vomit while in MAU 1400- c/w Dr. Claiborne Billings  Assessment and Plan  A: 1. Nausea/vomiting in pregnancy    P:  Discharge home Take diclegis & promethazine as prescribed by OB.  Keep scheduled follow up appointment. Discussed reasons to return to MAU  Judeth Horn, NP 01/07/2015 12:03 PM

## 2015-01-07 NOTE — Discharge Instructions (Signed)
°  Resume Diclegis & promethazine per Dr. Claiborne Billings     Morning Sickness Morning sickness is when you feel sick to your stomach (nauseous) during pregnancy. This nauseous feeling may or may not come with vomiting. It often occurs in the morning but can be a problem any time of day. Morning sickness is most common during the first trimester, but it may continue throughout pregnancy. While morning sickness is unpleasant, it is usually harmless unless you develop severe and continual vomiting (hyperemesis gravidarum). This condition requires more intense treatment.  CAUSES  The cause of morning sickness is not completely known but seems to be related to normal hormonal changes that occur in pregnancy. RISK FACTORS You are at greater risk if you:  Experienced nausea or vomiting before your pregnancy.  Had morning sickness during a previous pregnancy.  Are pregnant with more than one baby, such as twins. TREATMENT  Do not use any medicines (prescription, over-the-counter, or herbal) for morning sickness without first talking to your health care provider. Your health care provider may prescribe or recommend:  Vitamin B6 supplements.  Anti-nausea medicines.  The herbal medicine ginger. HOME CARE INSTRUCTIONS   Only take over-the-counter or prescription medicines as directed by your health care provider.  Taking multivitamins before getting pregnant can prevent or decrease the severity of morning sickness in most women.  Eat a piece of dry toast or unsalted crackers before getting out of bed in the morning.  Eat five or six small meals a day.  Eat dry and bland foods (rice, baked potato). Foods high in carbohydrates are often helpful.  Do not drink liquids with your meals. Drink liquids between meals.  Avoid greasy, fatty, and spicy foods.  Get someone to cook for you if the smell of any food causes nausea and vomiting.  If you feel nauseous after taking prenatal vitamins, take the  vitamins at night or with a snack.  Snack on protein foods (nuts, yogurt, cheese) between meals if you are hungry.  Eat unsweetened gelatins for desserts.  Wearing an acupressure wristband (worn for sea sickness) may be helpful.  Acupuncture may be helpful.  Do not smoke.  Get a humidifier to keep the air in your house free of odors.  Get plenty of fresh air. SEEK MEDICAL CARE IF:   Your home remedies are not working, and you need medicine.  You feel dizzy or lightheaded.  You are losing weight. SEEK IMMEDIATE MEDICAL CARE IF:   You have persistent and uncontrolled nausea and vomiting.  You pass out (faint). MAKE SURE YOU:  Understand these instructions.  Will watch your condition.  Will get help right away if you are not doing well or get worse. Document Released: 07/08/2006 Document Revised: 05/22/2013 Document Reviewed: 11/01/2012 [facility] Patient Information 2015 [place], [place]. This information is not intended to replace advice given to you by your health care provider. Make sure you discuss any questions you have with your health care provider.

## 2015-03-04 LAB — OB RESULTS CONSOLE RPR: RPR: NONREACTIVE

## 2015-04-21 ENCOUNTER — Other Ambulatory Visit: Payer: Self-pay | Admitting: Obstetrics and Gynecology

## 2015-04-25 ENCOUNTER — Inpatient Hospital Stay (HOSPITAL_COMMUNITY)
Admission: AD | Admit: 2015-04-25 | Discharge: 2015-04-25 | Disposition: A | Discharge status: Home / Self Care | Payer: Medicare Other | Source: Ambulatory Visit | Attending: Obstetrics and Gynecology | Admitting: Obstetrics and Gynecology

## 2015-04-25 DIAGNOSIS — O98813 Other maternal infectious and parasitic diseases complicating pregnancy, third trimester: Secondary | ICD-10-CM | POA: Insufficient documentation

## 2015-04-25 DIAGNOSIS — B373 Candidiasis of vulva and vagina: Secondary | ICD-10-CM | POA: Diagnosis not present

## 2015-04-25 DIAGNOSIS — O9989 Other specified diseases and conditions complicating pregnancy, childbirth and the puerperium: Secondary | ICD-10-CM

## 2015-04-25 DIAGNOSIS — R300 Dysuria: Secondary | ICD-10-CM | POA: Diagnosis present

## 2015-04-25 DIAGNOSIS — R102 Pelvic and perineal pain: Secondary | ICD-10-CM | POA: Diagnosis not present

## 2015-04-25 DIAGNOSIS — N949 Unspecified condition associated with female genital organs and menstrual cycle: Secondary | ICD-10-CM | POA: Diagnosis not present

## 2015-04-25 DIAGNOSIS — Z3A33 33 weeks gestation of pregnancy: Secondary | ICD-10-CM | POA: Insufficient documentation

## 2015-04-25 DIAGNOSIS — O26899 Other specified pregnancy related conditions, unspecified trimester: Secondary | ICD-10-CM

## 2015-04-25 DIAGNOSIS — B3731 Acute candidiasis of vulva and vagina: Secondary | ICD-10-CM

## 2015-04-25 LAB — URINALYSIS, ROUTINE W REFLEX MICROSCOPIC
BILIRUBIN URINE: NEGATIVE
Glucose, UA: NEGATIVE mg/dL
HGB URINE DIPSTICK: NEGATIVE
Ketones, ur: NEGATIVE mg/dL
NITRITE: NEGATIVE
PROTEIN: NEGATIVE mg/dL
Specific Gravity, Urine: 1.015 (ref 1.005–1.030)
pH: 6.5 (ref 5.0–8.0)

## 2015-04-25 LAB — URINE MICROSCOPIC-ADD ON: RBC / HPF: NONE SEEN RBC/hpf (ref 0–5)

## 2015-04-25 LAB — WET PREP, GENITAL
CLUE CELLS WET PREP: NONE SEEN
Sperm: NONE SEEN
Trich, Wet Prep: NONE SEEN
Yeast Wet Prep HPF POC: NONE SEEN

## 2015-04-25 MED ORDER — TERCONAZOLE 0.4 % VA CREA: 1.0000 | TOPICAL_CREAM | Freq: Every day | VAGINAL | Status: DC

## 2015-04-25 NOTE — MAU Provider Note (Signed)
Chief Complaint:  Dysuria and Flank Pain   First Provider Initiated Contact with Patient 04/25/15 [phone]      HPI: Shannon Aguirre is a 29 y.o. G2P1001 at 7570w4d who presents to maternity admissions reporting dysuria, urgency x 5 days and right sacral pain radiating through to right groin x 2 day. States she gets frequent UTIs and this feels the same, but they usually go away w/ pushing fluids and cranberry juice.   Location: right sacrum radiating to right groin Quality: sharp Severity: 0/10 in pain scale, 10/10 at worse Duration: 1-2 days Context: rolling over in bed, walking Timing: intermittent, 2-3 x per day Modifying factors: none. Hasn't triad anything for pain.  Associated signs and symptoms: Neg for fever, chills, hematuria, frequency, vaginal bleeding, vaginal discharge, LOF. Good fetal movement.   Past Medical History: Past Medical History  Diagnosis Date  . Asthma   . Allergy   . Depression   . PTSD (post-traumatic stress disorder)   . Headache   . Dissociative identity disorder   . GERD (gastroesophageal reflux disease)     Past obstetric history: OB History  Gravida Para Term Preterm AB SAB TAB Ectopic Multiple Living  2 1 1       0 1    # Outcome Date GA Lbr Len/2nd Weight Sex Delivery Anes PTL Lv  2 Current           1 Term 05/10/14 3974w0d  7 lb 14.3 oz (3.58 kg) M CS-LTranv EPI  Y      Past Surgical History: Past Surgical History  Procedure Laterality Date  . Breast surgery  x 2    breast reduction and lumpectomy  . Cesarean section N/A 05/10/2014    Procedure: CESAREAN SECTION;  Surgeon: Freddrick MarchKendra H. Tenny Crawoss, MD;  Location: [facility];  Service: Obstetrics;  Laterality: N/A;     Family History: Family History  Problem Relation Age of Onset  . Bipolar disorder Mother   . Cancer Father   . Sickle cell anemia Brother   . Breast cancer Maternal Grandmother   . Diabetes Maternal Grandmother   . Diabetes Maternal Grandfather   . Asthma Mother   . Asthma Sister   .  Asthma      nephew  . COPD Maternal Aunt     Social History: Social History  Substance Use Topics  . Smoking status: Former Smoker -- 1.00 packs/day for 8 years    Types: Cigarettes    Quit date: 09/09/2013  . Smokeless tobacco: Not on file  . Alcohol Use: No    Allergies:  Allergies  Allergen Reactions  . Nsaids Anaphylaxis  . Soybean-Containing Drug Products Swelling    Swelling of throat Swelling of lips    Meds:  No prescriptions prior to admission    I have reviewed patient's Past Medical Hx, Surgical Hx, Family Hx, Social Hx, medications and allergies.   ROS:  Review of Systems  Constitutional: Negative for fever and chills.  Respiratory: Shortness of breath: right groin.   Gastrointestinal: Positive for abdominal pain.  Genitourinary: Positive for dysuria and urgency. Negative for frequency, hematuria, flank pain, vaginal bleeding, vaginal discharge and difficulty urinating.  Musculoskeletal: Positive for back pain.  Psychiatric/Behavioral: Decreased concentration: Right sacrum.    Physical Exam   Patient Vitals for the past 24 hrs:  BP Temp Temp src Resp SpO2 Height Weight  04/25/15 0706 110/67 mmHg - - - - - -  04/25/15 0547 - 97.9 F (36.6 C) Oral 20 96 % - -  04/25/15 0500 - - - - -  (1.626 m) (!) 315 lb (142.883 kg)   Constitutional: Well-developed, well-nourished female in no acute distress.  Cardiovascular: normal rate Respiratory: normal effort GI: Abd soft, non-tender, gravid appropriate for gestational age.  MS: Extremities nontender, no edema, normal ROM Neurologic: Alert and oriented x 4.  GU: Neg CVAT.  Pelvic: NEFG, moderate amount of thick, curd-like, white, odorless discharge, neg pooling, no blood, cervix clean. No CMT   Cervic closed and long  FHT:  Baseline 140 , moderate variability, accelerations present, no decelerations Contractions: UI.   Most of tracing is on paper, not in Obix.   Labs: Results for orders placed or  performed during the hospital encounter of 04/25/15 (from the past 24 hour(s))  Urinalysis, Routine w reflex microscopic (not at [facility])     Status: Abnormal   Collection Time: 04/25/15  5:30 AM  Result Value Ref Range   Color, Urine YELLOW YELLOW   APPearance CLEAR CLEAR   Specific Gravity, Urine 1.015 1.005 - 1.030   pH 6.5 5.0 - 8.0   Glucose, UA NEGATIVE NEGATIVE mg/dL   Hgb urine dipstick NEGATIVE NEGATIVE   Bilirubin Urine NEGATIVE NEGATIVE   Ketones, ur NEGATIVE NEGATIVE mg/dL   Protein, ur NEGATIVE NEGATIVE mg/dL   Nitrite NEGATIVE NEGATIVE   Leukocytes, UA SMALL (A) NEGATIVE  Urine microscopic-add on     Status: Abnormal   Collection Time: 04/25/15  5:30 AM  Result Value Ref Range   Squamous Epithelial / LPF 0-5 (A) NONE SEEN   WBC, UA 0-5 0 - 5 WBC/hpf   RBC / HPF NONE SEEN 0 - 5 RBC/hpf   Bacteria, UA FEW (A) NONE SEEN   Urine-Other MUCOUS PRESENT   Wet prep, genital     Status: Abnormal   Collection Time: 04/25/15  6:31 AM  Result Value Ref Range   Yeast Wet Prep HPF POC NONE SEEN NONE SEEN   Trich, Wet Prep NONE SEEN NONE SEEN   Clue Cells Wet Prep HPF POC NONE SEEN NONE SEEN   WBC, Wet Prep HPF POC MODERATE (A) NONE SEEN   Sperm NONE SEEN     Imaging:  No results found.  MAU Course: UA, Wet prep  UA essentially neg, but will send culture due to Sx. Clinical Dx VVC. Discussed Sx, exam, labs w/ Dr. Claiborne Billings who agrees w/ POC.  MDM: 29 year-old female w/ possible UTI Sx, but essentially neg UA, culture pending. Dysuria may actually be due to VVC.   Assessment: 1. Pain of round ligament affecting pregnancy, antepartum   2. Dysuria   3. Vaginal yeast infection     Plan: Discharge home in stable condition.  Preterm labor precautions and fetal kick counts. Rx Terazol 7.     Follow-up Information    Follow up with [facility] On 04/29/2015.   Why:  Routine prenatal visit or sooner as needed if symptoms worsen   Contact information:   [address] [place] [address] [phone]       Follow up with [facility] [place] MATERNITY ADMISSIONS.   Why:  As needed in emergencies   Contact information:   [address] [id number] mc [place] [address] [phone]        Medication List    TAKE these medications        albuterol (2.5 MG/3ML) 0.083% nebulizer solution  Commonly known as:  PROVENTIL  Take 2.5 mg  by nebulization every 6 (six) hours as needed for wheezing or shortness of breath.     albuterol 108 (90 BASE) MCG/ACT inhaler  Commonly known as:  PROVENTIL HFA;VENTOLIN HFA  Inhale 2 puffs into the lungs every 6 (six) hours as needed for wheezing.     budesonide-formoterol 80-4.5 MCG/ACT inhaler  Commonly known as:  SYMBICORT  Inhale 2 puffs into the lungs daily.     cetirizine 10 MG tablet  Commonly known as:  ZYRTEC  Take 10 mg by mouth daily.     ferrous sulfate 325 (65 FE) MG tablet  Take 325 mg by mouth daily with breakfast.     prenatal multivitamin Tabs tablet  Take 1 tablet by mouth daily at 12 noon.     terconazole 0.4 % vaginal cream  Commonly known as:  TERAZOL 7  Place 1 applicator vaginally at bedtime.        [place], [place] 04/25/2015 8:38 AM

## 2015-04-25 NOTE — MAU Note (Signed)
20 minute strip did not flow in to chart. It was printed and put with chart. [place], CNM aware and reviewed it.

## 2015-04-25 NOTE — MAU Note (Signed)
UTI symptoms x 5 days. Painful urination, urgency and back pain. Denies ANY OB complaints.

## 2015-04-25 NOTE — Discharge Instructions (Signed)
Dysuria Dysuria is pain or discomfort while urinating. The pain or discomfort may be felt in the tube that carries urine out of the bladder (urethra) or in the surrounding tissue of the genitals. The pain may also be felt in the groin area, lower abdomen, and lower back. You may have to urinate frequently or have the sudden feeling that you have to urinate (urgency). Dysuria can affect both men and women, but is more common in women. Dysuria can be caused by many different things, including:  Urinary tract infection in women.  Infection of the kidney or bladder.  Kidney stones or bladder stones.  Certain sexually transmitted infections (STIs), such as chlamydia.  Dehydration.  Inflammation of the vagina.  Use of certain medicines.  Use of certain soaps or scented products that cause irritation. HOME CARE INSTRUCTIONS Watch your dysuria for any changes. The following actions may help to reduce any discomfort you are feeling:  Drink enough fluid to keep your urine clear or pale yellow.  Empty your bladder often. Avoid holding urine for long periods of time.  After a bowel movement or urination, women should cleanse from front to back, using each tissue only once.  Empty your bladder after sexual intercourse.  Take medicines only as directed by your health care provider.  If you were prescribed an antibiotic medicine, finish it all even if you start to feel better.  Avoid caffeine, tea, and alcohol. They can irritate the bladder and make dysuria worse. In men, alcohol may irritate the prostate.  Keep all follow-up visits as directed by your health care provider. This is important.  If you had any tests done to find the cause of dysuria, it is your responsibility to obtain your test results. Ask the lab or department performing the test when and how you will get your results. Talk with your health care provider if you have any questions about your results. SEEK MEDICAL CARE  IF:  You develop pain in your back or sides.  You have a fever.  You have nausea or vomiting.  You have blood in your urine.  You are not urinating as often as you usually do. SEEK IMMEDIATE MEDICAL CARE IF:  You pain is severe and not relieved with medicines.  You are unable to hold down any fluids.  You or someone else notices a change in your mental function.  You have a rapid heartbeat at rest.  You have shaking or chills.  You feel extremely weak.   This information is not intended to replace advice given to you by your health care provider. Make sure you discuss any questions you have with your health care provider.   Document Released: 02/13/2004 Document Revised: 06/07/2014 Document Reviewed: 01/10/2014 Elsevier Interactive Patient Education 2016 [employer]  Monilial Vaginitis Vaginitis in a soreness, swelling and redness (inflammation) of the vagina and vulva. Monilial vaginitis is not a sexually transmitted infection. CAUSES  Yeast vaginitis is caused by yeast (candida) that is normally found in your vagina. With a yeast infection, the candida has overgrown in number to a point that upsets the chemical balance. SYMPTOMS   White, thick vaginal discharge.  Swelling, itching, redness and irritation of the vagina and possibly the lips of the vagina (vulva).  Burning or painful urination.  Painful intercourse. DIAGNOSIS  Things that may contribute to monilial vaginitis are:  Postmenopausal and virginal states.  Pregnancy.  Infections.  Being tired, sick or stressed, especially if you had monilial vaginitis in the past.  Diabetes. Good control will help lower the chance.  Birth control pills.  Tight fitting garments.  Using bubble bath, feminine sprays, douches or deodorant tampons.  Taking certain medications that kill germs (antibiotics).  Sporadic recurrence can occur if you become ill. TREATMENT  Your caregiver will give you  medication.  There are several kinds of anti monilial vaginal creams and suppositories specific for monilial vaginitis. For recurrent yeast infections, use a suppository or cream in the vagina 2 times a week, or as directed.  Anti-monilial or steroid cream for the itching or irritation of the vulva may also be used. Get your caregiver's permission.  Painting the vagina with methylene blue solution may help if the monilial cream does not work.  Eating yogurt may help prevent monilial vaginitis. HOME CARE INSTRUCTIONS   Finish all medication as prescribed.  Do not have sex until treatment is completed or after your caregiver tells you it is okay.  Take warm sitz baths.  Do not douche.  Do not use tampons, especially scented ones.  Wear cotton underwear.  Avoid tight pants and panty hose.  Tell your sexual partner that you have a yeast infection. They should go to their caregiver if they have symptoms such as mild rash or itching.  Your sexual partner should be treated as well if your infection is difficult to eliminate.  Practice safer sex. Use condoms.  Some vaginal medications cause latex condoms to fail. Vaginal medications that harm condoms are:  Cleocin cream.  Butoconazole (Femstat).  Terconazole (Terazol) vaginal suppository.  Miconazole (Monistat) (may be purchased over the counter). SEEK MEDICAL CARE IF:   You have a temperature by mouth above 102 F (38.9 C).  The infection is getting worse after 2 days of treatment.  The infection is not getting better after 3 days of treatment.  You develop blisters in or around your vagina.  You develop vaginal bleeding, and it is not your menstrual period.  You have pain when you urinate.  You develop intestinal problems.  You have pain with sexual intercourse.   This information is not intended to replace advice given to you by your health care provider. Make sure you discuss any questions you have with your  health care provider.   Document Released: 02/24/2005 Document Revised: 08/09/2011 Document Reviewed: 11/18/2014 Elsevier Interactive Patient Education 2016 [employer]   Round Ligament Pain During Pregnancy   Round ligament pain is a sharp pain or jabbing feeling often felt in the lower belly or groin area on one or both sides. It is one of the most common complaints during pregnancy and is considered a normal part of pregnancy. It is most often felt during the second trimester.   Here is what you need to know about round ligament pain, including some tips to help you feel better.   Causes of Round Ligament Pain   Several thick ligaments surround and support your womb (uterus) as it grows during pregnancy. One of them is called the round ligament.   The round ligament connects the front part of the womb to your groin, the area where your legs attach to your pelvis. The round ligament normally tightens and relaxes slowly.   As your baby and womb grow, the round ligament stretches. That makes it more likely to become strained.   Sudden movements can cause the ligament to tighten quickly, like a rubber band snapping. This causes a sudden and quick jabbing feeling.   Symptoms of Round Ligament Pain  Round ligament pain can be concerning and uncomfortable. But it is considered normal as your body changes during pregnancy.   The symptoms of round ligament pain include a sharp, sudden spasm in the belly. It usually affects the right side, but it may happen on both sides. The pain only lasts a few seconds.   Exercise may cause the pain, as will rapid movements such as:  sneezing  coughing  laughing  rolling over in bed  standing up too quickly   Treatment of Round Ligament Pain   Here are some tips that may help reduce your discomfort:   Pain relief. Take over-the-counter acetaminophen for pain, if necessary. Ask your doctor if this is OK.   Exercise. Get plenty of exercise to  keep your stomach (core) muscles strong. Doing stretching exercises or prenatal yoga can be helpful. Ask your doctor which exercises are safe for you and your baby.   A helpful exercise involves putting your hands and knees on the floor, lowering your head, and pushing your backside into the air.   Avoid sudden movements. Change positions slowly (such as standing up or sitting down) to avoid sudden movements that may cause stretching and pain.   Flex your hips. Bend and flex your hips before you cough, sneeze, or laugh to avoid pulling on the ligaments.   Apply warmth. A heating pad or warm bath may be helpful. Ask your doctor if this is OK. Extreme heat can be dangerous to the baby.   You should try to modify your daily activity level and avoid positions that may worsen the condition.   When to Call the Doctor/Midwife   Always tell your doctor or midwife about any type of pain you have during pregnancy. Round ligament pain is quick and doesn't last long.   Call your health care provider immediately if you have:  severe pain  fever  chills  pain on urination  difficulty walking   Belly pain during pregnancy can be due to many different causes. It is important for your doctor to rule out more serious conditions, including pregnancy complications such as placenta abruption or non-pregnancy illnesses such as:  inguinal hernia  appendicitis  stomach, liver, and kidney problems  Preterm labor pains may sometimes be mistaken for round ligament pain.

## 2015-04-26 LAB — CULTURE, OB URINE: Special Requests: NORMAL

## 2015-04-30 ENCOUNTER — Inpatient Hospital Stay (HOSPITAL_COMMUNITY)
Admission: AD | Admit: 2015-04-30 | Discharge: 2015-04-30 | Disposition: A | Discharge status: Home / Self Care | Payer: Medicare Other | Source: Ambulatory Visit | Attending: Obstetrics and Gynecology | Admitting: Obstetrics and Gynecology

## 2015-04-30 ENCOUNTER — Encounter (HOSPITAL_COMMUNITY): Payer: Self-pay | Admitting: *Deleted

## 2015-04-30 DIAGNOSIS — R12 Heartburn: Secondary | ICD-10-CM | POA: Diagnosis not present

## 2015-04-30 DIAGNOSIS — Z3A34 34 weeks gestation of pregnancy: Secondary | ICD-10-CM | POA: Diagnosis not present

## 2015-04-30 DIAGNOSIS — O26893 Other specified pregnancy related conditions, third trimester: Secondary | ICD-10-CM | POA: Diagnosis not present

## 2015-04-30 DIAGNOSIS — R109 Unspecified abdominal pain: Secondary | ICD-10-CM | POA: Diagnosis present

## 2015-04-30 LAB — LIPASE, BLOOD: LIPASE: 26 U/L (ref 11–51)

## 2015-04-30 LAB — COMPREHENSIVE METABOLIC PANEL
ALBUMIN: 2.9 g/dL — AB (ref 3.5–5.0)
ALT: 19 U/L (ref 14–54)
ANION GAP: 7 (ref 5–15)
AST: 21 U/L (ref 15–41)
Alkaline Phosphatase: 112 U/L (ref 38–126)
BUN: 5 mg/dL — ABNORMAL LOW (ref 6–20)
CHLORIDE: 108 mmol/L (ref 101–111)
CO2: 21 mmol/L — AB (ref 22–32)
Calcium: 8.8 mg/dL — ABNORMAL LOW (ref 8.9–10.3)
Creatinine, Ser: 0.5 mg/dL (ref 0.44–1.00)
GFR calc non Af Amer: >60 mL/min (ref >60)
GLUCOSE: 95 mg/dL (ref 65–99)
POTASSIUM: 3.5 mmol/L (ref 3.5–5.1)
SODIUM: 136 mmol/L (ref 135–145)
Total Bilirubin: 0.4 mg/dL (ref 0.3–1.2)
Total Protein: 7.2 g/dL (ref 6.5–8.1)

## 2015-04-30 LAB — CBC
HCT: 32.7 % — ABNORMAL LOW (ref 36.0–46.0)
Hemoglobin: 10.9 g/dL — ABNORMAL LOW (ref 12.0–15.0)
MCH: 27.7 pg (ref 26.0–34.0)
MCHC: 33.3 g/dL (ref 30.0–36.0)
MCV: 83 fL (ref 78.0–100.0)
PLATELETS: 232 10*3/uL (ref 150–400)
RBC: 3.94 MIL/uL (ref 3.87–5.11)
RDW: 14.6 % (ref 11.5–15.5)
WBC: 9.6 10*3/uL (ref 4.0–10.5)

## 2015-04-30 LAB — AMYLASE: Amylase: 53 U/L (ref 28–100)

## 2015-04-30 MED ORDER — FAMOTIDINE IN NACL 20-0.9 MG/50ML-% IV SOLN
20.0000 mg | Freq: Once | INTRAVENOUS | Status: AC
  Administered 2015-04-30: 20 mg via INTRAVENOUS
  Filled 2015-04-30: qty 50

## 2015-04-30 MED ORDER — GI COCKTAIL ~~LOC~~
30.0000 mL | Freq: Once | ORAL | Status: AC
  Administered 2015-04-30: 30 mL via ORAL
  Filled 2015-04-30: qty 30

## 2015-04-30 MED ORDER — OMEPRAZOLE 20 MG PO CPDR: 20.0000 mg | DELAYED_RELEASE_CAPSULE | Freq: Every day | ORAL | Status: DC

## 2015-04-30 MED ORDER — LACTATED RINGERS IV BOLUS (SEPSIS)
1000.0000 mL | Freq: Once | INTRAVENOUS | Status: AC
  Administered 2015-04-30: 1000 mL via INTRAVENOUS

## 2015-04-30 NOTE — [facility] Provider Note (Signed)
History     CSN: [id number]  Arrival date and time: 04/30/15 4098   First Provider Initiated Contact with Patient 04/30/15 [phone]       Chief Complaint  Patient presents with  . Abdominal Pain  . Chest Pain   Shannon Aguirre is a 29 y.o. G2P1001 at [redacted]w[redacted]d who presents with chest & abdominal pain.   Chest Pain  This is a new problem. The current episode started today (at 630 am). The onset quality is sudden. The pain is present in the epigastric region. The pain is at a severity of 9/10. The quality of the pain is described as pressure and sharp. Associated symptoms include abdominal pain (constant abdominal pain that she states feels like contractions), back pain, nausea and shortness of breath. Pertinent negatives include no cough, fever, irregular heartbeat, palpitations, sputum production or vomiting. The pain is aggravated by deep breathing. She has tried antacids (tums) for the symptoms. The treatment provided no relief. Risk factors include obesity.  Pertinent negatives for past medical history include no diabetes, no hypertension, no MI and no PE.  Abdominal Pain This is a new problem. The current episode started today. The onset quality is sudden. The problem occurs constantly. The problem has been unchanged. The pain is located in the epigastric region, LUQ and RUQ. The pain is at a severity of 8/10. The quality of the pain is sharp. The abdominal pain radiates to the back. Associated symptoms include nausea. Pertinent negatives include no diarrhea, fever or vomiting. The pain is aggravated by deep breathing. The pain is relieved by nothing. She has tried antacids for the symptoms. The treatment provided no relief. Her past medical history is significant for GERD.   Pt denies LOF or vaginal bleeding.  Positive fetal movement.  Has hx of asthma. Reports some SOB & wheezing this morning which resolved after breathing treatment at 0730.   OB History    Gravida Para Term Preterm AB TAB SAB  Ectopic Multiple Living   0 1      Past Medical History  Diagnosis Date  . Asthma   . Allergy   . Depression   . PTSD (post-traumatic stress disorder)   . Headache   . Dissociative identity disorder   . GERD (gastroesophageal reflux disease)     Past Surgical History  Procedure Laterality Date  . Breast surgery  x 2    breast reduction and lumpectomy  . Cesarean section N/A 05/10/2014    Procedure: CESAREAN SECTION;  Surgeon: Freddrick March. Tenny Craw, MD;  Location: [facility];  Service: Obstetrics;  Laterality: N/A;    Family History  Problem Relation Age of Onset  . Bipolar disorder Mother   . Cancer Father   . Sickle cell anemia Brother   . Breast cancer Maternal Grandmother   . Diabetes Maternal Grandmother   . Diabetes Maternal Grandfather   . Asthma Mother   . Asthma Sister   . Asthma      nephew  . COPD Maternal Aunt     Social History  Substance Use Topics  . Smoking status: Former Smoker -- 1.00 packs/day for 8 years    Types: Cigarettes    Quit date: 09/09/2013  . Smokeless tobacco: Not on file  . Alcohol Use: No    Allergies:  Allergies  Allergen Reactions  . Nsaids Anaphylaxis  . Soybean-Containing Drug Products Swelling    Swelling of throat Swelling of lips  Prescriptions prior to admission  Medication Sig Dispense Refill Last Dose  . albuterol (PROVENTIL HFA;VENTOLIN HFA) 108 (90 BASE) MCG/ACT inhaler Inhale 2 puffs into the lungs every 6 (six) hours as needed for wheezing.   01/06/2015 at Unknown time  . albuterol (PROVENTIL) (2.5 MG/3ML) 0.083% nebulizer solution Take 2.5 mg by nebulization every 6 (six) hours as needed for wheezing or shortness of breath.   01/07/2015 at Unknown time  . budesonide-formoterol (SYMBICORT) 80-4.5 MCG/ACT inhaler Inhale 2 puffs into the lungs daily.   01/07/2015 at Unknown time  . cetirizine (ZYRTEC) 10 MG tablet Take 10 mg by mouth daily.   01/07/2015 at Unknown time  . ferrous sulfate 325 (65 FE) MG tablet Take  325 mg by mouth daily with breakfast.   01/06/2015 at Unknown time  . Prenatal Vit-Fe Fumarate-FA (PRENATAL MULTIVITAMIN) TABS tablet Take 1 tablet by mouth daily at 12 noon.   01/07/2015 at Unknown time  . terconazole (TERAZOL 7) 0.4 % vaginal cream Place 1 applicator vaginally at bedtime. 45 g 0     Review of Systems  Constitutional: Negative for fever.  Respiratory: Positive for shortness of breath. Negative for cough and sputum production.   Cardiovascular: Positive for chest pain. Negative for palpitations.  Gastrointestinal: Positive for nausea and abdominal pain (constant abdominal pain that she states feels like contractions). Negative for vomiting and diarrhea.  Genitourinary: Negative.   Musculoskeletal: Positive for back pain.   Physical Exam   Blood pressure 126/81, pulse 95, temperature 97.8 F (36.6 C), resp. rate 18, SpO2 99 %, unknown if currently breastfeeding.  Physical Exam  Nursing note and vitals reviewed. Constitutional: She is oriented to person, place, and time. She appears well-developed and well-nourished. She appears distressed.  HENT:  Head: Normocephalic and atraumatic.  Eyes: Conjunctivae are normal. Right eye exhibits no discharge. Left eye exhibits no discharge. No scleral icterus.  Neck: Normal range of motion.  Cardiovascular: Normal rate, regular rhythm and normal heart sounds.   No murmur heard. Respiratory: Effort normal and breath sounds normal. No respiratory distress. She has no wheezes.  GI: Soft. Bowel sounds are normal. There is tenderness in the right upper quadrant, epigastric area and left upper quadrant. There is no rigidity, no guarding, no CVA tenderness and negative Murphy's sign.  Neurological: She is alert and oriented to person, place, and time.  Skin: Skin is warm and dry. She is not diaphoretic.  Psychiatric: She has a normal mood and affect. Her behavior is normal. Judgment and thought content normal.   Fetal Tracing:  Baseline:  145 Variability: moderate Accelerations: 15x15 Decelerations: none  Toco: none  Dilation: Closed Exam by:: Judeth HornErin Sohan Potvin NP  MAU Course  Procedures Results for orders placed or performed during the hospital encounter of 04/30/15 (from the past 24 hour(s))  CBC     Status: Abnormal   Collection Time: 04/30/15  9:06 AM  Result Value Ref Range   WBC 9.6 4.0 - 10.5 K/uL   RBC 3.94 3.87 - 5.11 MIL/uL   Hemoglobin 10.9 (L) 12.0 - 15.0 g/dL   HCT 16.132.7 (L) 09.636.0 - 04.546.0 %   MCV 83.0 78.0 - 100.0 fL   MCH 27.7 26.0 - 34.0 pg   MCHC 33.3 30.0 - 36.0 g/dL   RDW 40.914.6 81.111.5 - 91.415.5 %   Platelets 232 150 - 400 K/uL  Comprehensive metabolic panel     Status: Abnormal   Collection Time: 04/30/15  9:06 AM  Result Value Ref Range  Sodium 136 135 - 145 mmol/L   Potassium 3.5 3.5 - 5.1 mmol/L   Chloride 108 101 - 111 mmol/L   CO2 21 (L) 22 - 32 mmol/L   Glucose, Bld 95 65 - 99 mg/dL   BUN 5 (L) 6 - 20 mg/dL   Creatinine, Ser 6.57 0.44 - 1.00 mg/dL   Calcium 8.8 (L) 8.9 - 10.3 mg/dL   Total Protein 7.2 6.5 - 8.1 g/dL   Albumin 2.9 (L) 3.5 - 5.0 g/dL   AST 21 15 - 41 U/L   ALT 19 14 - 54 U/L   Alkaline Phosphatase 112 38 - 126 U/L   Total Bilirubin 0.4 0.3 - 1.2 mg/dL   GFR calc non Af Amer >60 >60 mL/min   GFR calc Af Amer >60 >60 mL/min   Anion gap 7 5 - 15  Amylase     Status: None   Collection Time: 04/30/15  9:06 AM  Result Value Ref Range   Amylase 53 28 - 100 U/L  Lipase, blood     Status: None   Collection Time: 04/30/15  9:06 AM  Result Value Ref Range   Lipase 26 11 - 51 U/L    MDM EKG ordered - normal sinus rhythm GI cocktail - no improvement Category 1 tracing, no contractions IV fluids & IV pepcid - pain resolved  1018- S/w Dr. Tenny Craw. Discussed presentation, assessment, treatment, & labs. Ok to discharge home. Encourage patient to take daily acid suppressant.   Assessment and Plan  A: 1. Heartburn during pregnancy in third trimester    P: Discharge home Take  prilosec daily Discussed reasons to return to [facility]U Decrease ingestion of high fat & spicy foods Keep f/u with OB  Judeth Horn, NP  04/30/2015, 8:37 AM

## 2015-04-30 NOTE — Discharge Instructions (Signed)
Food Choices for Gastroesophageal Reflux Disease, Adult When you have gastroesophageal reflux disease (GERD), the foods you eat and your eating habits are very important. Choosing the right foods can help ease the discomfort of GERD. WHAT GENERAL GUIDELINES DO I NEED TO FOLLOW?  Choose fruits, vegetables, whole grains, low-fat dairy products, and low-fat meat, fish, and poultry.  Limit fats such as oils, salad dressings, butter, nuts, and avocado.  Keep a food diary to identify foods that cause symptoms.  Avoid foods that cause reflux. These may be different for different people.  Eat frequent small meals instead of three large meals each day.  Eat your meals slowly, in a relaxed setting.  Limit fried foods.  Cook foods using methods other than frying.  Avoid drinking alcohol.  Avoid drinking large amounts of liquids with your meals.  Avoid bending over or lying down until 2-3 hours after eating. WHAT FOODS ARE NOT RECOMMENDED? The following are some foods and drinks that may worsen your symptoms: Vegetables Tomatoes. Tomato juice. Tomato and spaghetti sauce. Chili peppers. Onion and garlic. Horseradish. Fruits Oranges, grapefruit, and lemon (fruit and juice). Meats High-fat meats, fish, and poultry. This includes hot dogs, ribs, ham, sausage, salami, and bacon. Dairy Whole milk and chocolate milk. Sour cream. Cream. Butter. Ice cream. Cream cheese.  Beverages Coffee and tea, with or without caffeine. Carbonated beverages or energy drinks. Condiments Hot sauce. Barbecue sauce.  Sweets/Desserts Chocolate and cocoa. Donuts. Peppermint and spearmint. Fats and Oils High-fat foods, including [place] fries and potato chips. Other Vinegar. Strong spices, such as black pepper, white pepper, red pepper, cayenne, curry powder, cloves, ginger, and chili powder. The items listed above may not be a complete list of foods and beverages to avoid. Contact your dietitian for more  information.   This information is not intended to replace advice given to you by your health care provider. Make sure you discuss any questions you have with your health care provider.   Document Released: 05/17/2005 Document Revised: 06/07/2014 Document Reviewed: 03/21/2013 Elsevier Interactive Patient Education 2016 [employer] Gastroesophageal Reflux Disease, Adult Normally, food travels down the esophagus and stays in the stomach to be digested. However, when a person has gastroesophageal reflux disease (GERD), food and stomach acid move back up into the esophagus. When this happens, the esophagus becomes sore and inflamed. Over time, GERD can create small holes (ulcers) in the lining of the esophagus.  CAUSES This condition is caused by a problem with the muscle between the esophagus and the stomach (lower esophageal sphincter, or LES). Normally, the LES muscle closes after food passes through the esophagus to the stomach. When the LES is weakened or abnormal, it does not close properly, and that allows food and stomach acid to go back up into the esophagus. The LES can be weakened by certain dietary substances, medicines, and medical conditions, including:  Tobacco use.  Pregnancy.  Having a hiatal hernia.  Heavy alcohol use.  Certain foods and beverages, such as coffee, chocolate, onions, and peppermint. RISK FACTORS This condition is more likely to develop in: 1. People who have an increased body weight. 2. People who have connective tissue disorders. 3. People who use NSAID medicines. SYMPTOMS Symptoms of this condition include:  Heartburn.  Difficult or painful swallowing.  The feeling of having a lump in the throat.  Abitter taste in the mouth.  Bad breath.  Having a large amount of saliva.  Having an upset or bloated stomach.  Belching.  Chest pain.  Shortness of breath or wheezing. °· Ongoing (chronic) cough or a night-time cough. °· Wearing away of  tooth enamel. °· Weight loss. °Different conditions can cause chest pain. Make sure to see your health care provider if you experience chest pain. °DIAGNOSIS °Your health care provider will take a medical history and perform a physical exam. To determine if you have mild or severe GERD, your health care provider may also monitor how you respond to treatment. You may also have other tests, including: °· An endoscopy to examine your stomach and esophagus with a small camera. °· A test that measures the acidity level in your esophagus. °· A test that measures how much pressure is on your esophagus. °· A barium swallow or modified barium swallow to show the shape, size, and functioning of your esophagus. °TREATMENT °The goal of treatment is to help relieve your symptoms and to prevent complications. Treatment for this condition may vary depending on how severe your symptoms are. Your health care provider may recommend: °· Changes to your diet. °· Medicine. °· Surgery. °HOME CARE INSTRUCTIONS °Diet °· Follow a diet as recommended by your health care provider. This may involve avoiding foods and drinks such as: °¨ Coffee and tea (with or without caffeine). °¨ Drinks that contain alcohol. °¨ Energy drinks and sports drinks. °¨ Carbonated drinks or sodas. °¨ Chocolate and cocoa. °¨ Peppermint and mint flavorings. °¨ Garlic and onions. °¨ Horseradish. °¨ Spicy and acidic foods, including peppers, chili powder, curry powder, vinegar, hot sauces, and barbecue sauce. °¨ Citrus fruit juices and citrus fruits, such as oranges, lemons, and limes. °¨ Tomato-based foods, such as red sauce, chili, salsa, and pizza with red sauce. °¨ Fried and fatty foods, such as donuts, french fries, potato chips, and high-fat dressings. °¨ High-fat meats, such as hot dogs and fatty cuts of red and white meats, such as rib eye steak, sausage, ham, and bacon. °¨ High-fat dairy items, such as whole milk, butter, and cream cheese. °· Eat small,  frequent meals instead of large meals. °· Avoid drinking large amounts of liquid with your meals. °· Avoid eating meals during the 2-3 hours before bedtime. °· Avoid lying down right after you eat. °· Do not exercise right after you eat. ° General Instructions  °· Pay attention to any changes in your symptoms. °· Take over-the-counter and prescription medicines only as told by your health care provider. Do not take aspirin, ibuprofen, or other NSAIDs unless your health care provider told you to do so. °· Do not use any tobacco products, including cigarettes, chewing tobacco, and e-cigarettes. If you need help quitting, ask your health care provider. °· Wear loose-fitting clothing. Do not wear anything tight around your waist that causes pressure on your abdomen. °· Raise (elevate) the head of your bed 6 inches (15cm). °· Try to reduce your stress, such as with yoga or meditation. If you need help reducing stress, ask your health care provider. °· If you are overweight, reduce your weight to an amount that is healthy for you. Ask your health care provider for guidance about a safe weight loss goal. °· Keep all follow-up visits as told by your health care provider. This is important. °SEEK MEDICAL CARE IF: °· You have new symptoms. °· You have unexplained weight loss. °· You have difficulty swallowing, or it hurts to swallow. °· You have wheezing or a persistent cough. °· Your symptoms do not improve with treatment. °· You have a hoarse voice. °SEEK IMMEDIATE MEDICAL CARE IF: °· You have pain   in your arms, neck, jaw, teeth, or back.  You feel sweaty, dizzy, or light-headed.  You have chest pain or shortness of breath.  You vomit and your vomit looks like blood or coffee grounds.  You faint.  Your stool is bloody or black.  You cannot swallow, drink, or eat.   This information is not intended to replace advice given to you by your health care provider. Make sure you discuss any questions you have with  your health care provider.   Document Released: 02/24/2005 Document Revised: 02/05/2015 Document Reviewed: 09/11/2014 Elsevier Interactive Patient Education 2016 Else[employer] Inc[employer]   Fetal Movement Counts Patient Name: __________________________________________________ Patient Due Date: ____________________ Performing a fetal movement count is highly recommended in high-risk pregnancies, but it is good for every pregnant woman to do. Your health care provider may ask you to start counting fetal movements at 28 weeks of the pregnancy. Fetal movements often increase:  After eating a full meal.  After physical activity.  After eating or drinking something sweet or cold.  At rest. Pay attention to when you feel the baby is most active. This will help you notice a pattern of your baby's sleep and wake cycles and what factors contribute to an increase in fetal movement. It is important to perform a fetal movement count at the same time each day when your baby is normally most active.  HOW TO COUNT FETAL MOVEMENTS 4. Find a quiet and comfortable area to sit or lie down on your left side. Lying on your left side provides the best blood and oxygen circulation to your baby. 5. Write down the day and time on a sheet of paper or in a journal. 6. Start counting kicks, flutters, swishes, rolls, or jabs in a 2-hour period. You should feel at least 10 movements within 2 hours. 7. If you do not feel 10 movements in 2 hours, wait 2-3 hours and count again. Look for a change in the pattern or not enough counts in 2 hours. SEEK MEDICAL CARE IF:  You feel less than 10 counts in 2 hours, tried twice.  There is no movement in over an hour.  The pattern is changing or taking longer each day to reach 10 counts in 2 hours.  You feel the baby is not moving as he or she usually does. Date: ____________ Movements: ____________ Start time: ____________ Doreatha MartinFinish time: ____________  Date: ____________ Movements:  ____________ Start time: ____________ Doreatha MartinFinish time: ____________ Date: ____________ Movements: ____________ Start time: ____________ Doreatha MartinFinish time: ____________ Date: ____________ Movements: ____________ Start time: ____________ Doreatha MartinFinish time: ____________ Date: ____________ Movements: ____________ Start time: ____________ Doreatha MartinFinish time: ____________ Date: ____________ Movements: ____________ Start time: ____________ Doreatha MartinFinish time: ____________ Date: ____________ Movements: ____________ Start time: ____________ Doreatha MartinFinish time: ____________ Date: ____________ Movements: ____________ Start time: ____________ Doreatha MartinFinish time: ____________  Date: ____________ Movements: ____________ Start time: ____________ Doreatha MartinFinish time: ____________ Date: ____________ Movements: ____________ Start time: ____________ Doreatha MartinFinish time: ____________ Date: ____________ Movements: ____________ Start time: ____________ Doreatha MartinFinish time: ____________ Date: ____________ Movements: ____________ Start time: ____________ Doreatha MartinFinish time: ____________ Date: ____________ Movements: ____________ Start time: ____________ Doreatha MartinFinish time: ____________ Date: ____________ Movements: ____________ Start time: ____________ Doreatha MartinFinish time: ____________ Date: ____________ Movements: ____________ Start time: ____________ Doreatha MartinFinish time: ____________  Date: ____________ Movements: ____________ Start time: ____________ Doreatha MartinFinish time: ____________ Date: ____________ Movements: ____________ Start time: ____________ Doreatha MartinFinish time: ____________ Date: ____________ Movements: ____________ Start time: ____________ Doreatha MartinFinish time: ____________ Date: ____________ Movements: ____________ Start time: ____________ Doreatha MartinFinish time: ____________ Date: ____________ Movements: ____________ Start time:  ____________ Doreatha Martin time: ____________ Date: ____________ Movements: ____________ Start time: ____________ Doreatha Martin time: ____________ Date: ____________ Movements: ____________ Start time: ____________ Doreatha Martin  time: ____________  Date: ____________ Movements: ____________ Start time: ____________ Doreatha Martin time: ____________ Date: ____________ Movements: ____________ Start time: ____________ Doreatha Martin time: ____________ Date: ____________ Movements: ____________ Start time: ____________ Doreatha Martin time: ____________ Date: ____________ Movements: ____________ Start time: ____________ Doreatha Martin time: ____________ Date: ____________ Movements: ____________ Start time: ____________ Doreatha Martin time: ____________ Date: ____________ Movements: ____________ Start time: ____________ Doreatha Martin time: ____________ Date: ____________ Movements: ____________ Start time: ____________ Doreatha Martin time: ____________  Date: ____________ Movements: ____________ Start time: ____________ Doreatha Martin time: ____________ Date: ____________ Movements: ____________ Start time: ____________ Doreatha Martin time: ____________ Date: ____________ Movements: ____________ Start time: ____________ Doreatha Martin time: ____________ Date: ____________ Movements: ____________ Start time: ____________ Doreatha Martin time: ____________ Date: ____________ Movements: ____________ Start time: ____________ Doreatha Martin time: ____________ Date: ____________ Movements: ____________ Start time: ____________ Doreatha Martin time: ____________ Date: ____________ Movements: ____________ Start time: ____________ Doreatha Martin time: ____________  Date: ____________ Movements: ____________ Start time: ____________ Doreatha Martin time: ____________ Date: ____________ Movements: ____________ Start time: ____________ Doreatha Martin time: ____________ Date: ____________ Movements: ____________ Start time: ____________ Doreatha Martin time: ____________ Date: ____________ Movements: ____________ Start time: ____________ Doreatha Martin time: ____________ Date: ____________ Movements: ____________ Start time: ____________ Doreatha Martin time: ____________ Date: ____________ Movements: ____________ Start time: ____________ Doreatha Martin time: ____________ Date: ____________  Movements: ____________ Start time: ____________ Doreatha Martin time: ____________  Date: ____________ Movements: ____________ Start time: ____________ Doreatha Martin time: ____________ Date: ____________ Movements: ____________ Start time: ____________ Doreatha Martin time: ____________ Date: ____________ Movements: ____________ Start time: ____________ Doreatha Martin time: ____________ Date: ____________ Movements: ____________ Start time: ____________ Doreatha Martin time: ____________ Date: ____________ Movements: ____________ Start time: ____________ Doreatha Martin time: ____________ Date: ____________ Movements: ____________ Start time: ____________ Doreatha Martin time: ____________ Date: ____________ Movements: ____________ Start time: ____________ Doreatha Martin time: ____________  Date: ____________ Movements: ____________ Start time: ____________ Doreatha Martin time: ____________ Date: ____________ Movements: ____________ Start time: ____________ Doreatha Martin time: ____________ Date: ____________ Movements: ____________ Start time: ____________ Doreatha Martin time: ____________ Date: ____________ Movements: ____________ Start time: ____________ Doreatha Martin time: ____________ Date: ____________ Movements: ____________ Start time: ____________ Doreatha Martin time: ____________ Date: ____________ Movements: ____________ Start time: ____________ Doreatha Martin time: ____________   This information is not intended to replace advice given to you by your health care provider. Make sure you discuss any questions you have with your health care provider.   Document Released: 06/16/2006 Document Revised: 06/07/2014 Document Reviewed: 03/13/2012 Elsevier Interactive Patient Education [employer]

## 2015-04-30 NOTE — MAU Note (Signed)
Pt presents to [facility]U with complaints if lower abdominal cramping since she woke up this morning with pain under her breast. Pt states that she did take to antacids this morning before coming in. Pt is for a repeat cesarean section on January the 5th.

## 2015-05-16 LAB — OB RESULTS CONSOLE GBS: STREP GROUP B AG: NEGATIVE

## 2015-06-03 NOTE — Patient Instructions (Addendum)
Your procedure is scheduled on:  Thursday, Jan. 5, 2017  [facility]:  10:30 A.M.  Pick up the phone at the desk and dial [phone].  Call this number if you have problems the morning of surgery: [phone].  Remember: Do NOT eat food:  AFTER MIDNIGHT TONIGHT Do NOT drink clear liquids after:  8:00 A.M. MORNING OF SURGERY Take these medicines the morning of surgery with a SIP OF WATER:  OMEPRAZOLE  *BRING ASTHMA INHALERS DAY OF SURGERY  Do NOT wear jewelry (body piercing), metal hair clips/bobby pins, or nail polish. Do NOT wear lotions, powders, or perfumes.  You may wear deoderant. Do NOT shave for 48 hours prior to surgery. Do NOT bring valuables to the hospital.  Leave suitcase in car.  After surgery it may be brought to your room.  For patients admitted to the hospital, checkout time is 11:00 AM the day of discharge.

## 2015-06-04 ENCOUNTER — Encounter (HOSPITAL_COMMUNITY)
Admission: RE | Admit: 2015-06-04 | Discharge: 2015-06-04 | Disposition: A | Discharge status: Home / Self Care | Payer: Medicare Other | Source: Ambulatory Visit | Attending: Obstetrics and Gynecology | Admitting: Obstetrics and Gynecology

## 2015-06-04 ENCOUNTER — Encounter (HOSPITAL_COMMUNITY): Payer: Self-pay

## 2015-06-04 HISTORY — DX: Anemia, unspecified: D64.9

## 2015-06-04 LAB — CBC
HCT: 34.3 % — ABNORMAL LOW (ref 36.0–46.0)
HEMOGLOBIN: 11.1 g/dL — AB (ref 12.0–15.0)
MCH: 27.2 pg (ref 26.0–34.0)
MCHC: 32.4 g/dL (ref 30.0–36.0)
MCV: 84.1 fL (ref 78.0–100.0)
Platelets: 222 10*3/uL (ref 150–400)
RBC: 4.08 MIL/uL (ref 3.87–5.11)
RDW: 14.7 % (ref 11.5–15.5)
WBC: 8.6 10*3/uL (ref 4.0–10.5)

## 2015-06-04 MED ORDER — DEXTROSE 5 % IV SOLN
3.0000 g | INTRAVENOUS | Status: AC
  Administered 2015-06-05: 3 g via INTRAVENOUS
  Filled 2015-06-04: qty 3000

## 2015-06-05 ENCOUNTER — Inpatient Hospital Stay (HOSPITAL_COMMUNITY): Payer: Medicare Other | Admitting: Anesthesiology

## 2015-06-05 ENCOUNTER — Inpatient Hospital Stay (HOSPITAL_COMMUNITY)
Admission: RE | Admit: 2015-06-05 | Discharge: 2015-06-07 | DRG: 765 | Disposition: A | Discharge status: Home / Self Care | Payer: Medicare Other | Source: Ambulatory Visit | Attending: Obstetrics and Gynecology | Admitting: Obstetrics and Gynecology

## 2015-06-05 ENCOUNTER — Encounter (HOSPITAL_COMMUNITY)
Admission: RE | Disposition: A | Discharge status: Home / Self Care | Payer: Self-pay | Source: Ambulatory Visit | Attending: Obstetrics and Gynecology

## 2015-06-05 ENCOUNTER — Encounter (HOSPITAL_COMMUNITY): Payer: Self-pay | Admitting: Anesthesiology

## 2015-06-05 DIAGNOSIS — O99344 Other mental disorders complicating childbirth: Secondary | ICD-10-CM | POA: Diagnosis present

## 2015-06-05 DIAGNOSIS — Z3A39 39 weeks gestation of pregnancy: Secondary | ICD-10-CM

## 2015-06-05 DIAGNOSIS — O99214 Obesity complicating childbirth: Secondary | ICD-10-CM | POA: Diagnosis present

## 2015-06-05 DIAGNOSIS — O34211 Maternal care for low transverse scar from previous cesarean delivery: Principal | ICD-10-CM | POA: Diagnosis present

## 2015-06-05 DIAGNOSIS — Z6841 Body Mass Index (BMI) 40.0 and over, adult: Secondary | ICD-10-CM | POA: Diagnosis not present

## 2015-06-05 DIAGNOSIS — Z833 Family history of diabetes mellitus: Secondary | ICD-10-CM

## 2015-06-05 DIAGNOSIS — O9962 Diseases of the digestive system complicating childbirth: Secondary | ICD-10-CM | POA: Diagnosis present

## 2015-06-05 DIAGNOSIS — Z87891 Personal history of nicotine dependence: Secondary | ICD-10-CM

## 2015-06-05 DIAGNOSIS — F418 Other specified anxiety disorders: Secondary | ICD-10-CM | POA: Diagnosis present

## 2015-06-05 DIAGNOSIS — K219 Gastro-esophageal reflux disease without esophagitis: Secondary | ICD-10-CM | POA: Diagnosis present

## 2015-06-05 LAB — PREPARE RBC (CROSSMATCH)

## 2015-06-05 LAB — RPR: RPR Ser Ql: NONREACTIVE

## 2015-06-05 SURGERY — Surgical Case
Anesthesia: Spinal

## 2015-06-05 MED ORDER — ONDANSETRON HCL 4 MG/2ML IJ SOLN
INTRAMUSCULAR | Status: DC | PRN
  Administered 2015-06-05: 4 mg via INTRAVENOUS

## 2015-06-05 MED ORDER — ONDANSETRON HCL 4 MG/2ML IJ SOLN
4.0000 mg | Freq: Three times a day (TID) | INTRAMUSCULAR | Status: DC | PRN
  Administered 2015-06-05: 4 mg via INTRAVENOUS
  Filled 2015-06-05: qty 2

## 2015-06-05 MED ORDER — MENTHOL 3 MG MT LOZG: 1.0000 | LOZENGE | OROMUCOSAL | Status: DC | PRN

## 2015-06-05 MED ORDER — LACTATED RINGERS IV SOLN: 2.5000 [IU]/h | INTRAVENOUS | Status: AC

## 2015-06-05 MED ORDER — TETANUS-DIPHTH-ACELL PERTUSSIS 5-2.5-18.5 LF-MCG/0.5 IM SUSP: 0.5000 mL | Freq: Once | INTRAMUSCULAR | Status: DC

## 2015-06-05 MED ORDER — PHENYLEPHRINE 8 MG IN D5W 100 ML (0.08MG/ML) PREMIX OPTIME
INJECTION | INTRAVENOUS | Status: DC | PRN
  Administered 2015-06-05: 60 ug/min via INTRAVENOUS

## 2015-06-05 MED ORDER — SIMETHICONE 80 MG PO CHEW: 80.0000 mg | CHEWABLE_TABLET | ORAL | Status: DC | PRN

## 2015-06-05 MED ORDER — SIMETHICONE 80 MG PO CHEW
80.0000 mg | CHEWABLE_TABLET | Freq: Three times a day (TID) | ORAL | Status: DC
  Administered 2015-06-06 – 2015-06-07 (×4): 80 mg via ORAL
  Filled 2015-06-05 (×4): qty 1

## 2015-06-05 MED ORDER — MORPHINE SULFATE (PF) 0.5 MG/ML IJ SOLN
INTRAMUSCULAR | Status: AC
  Filled 2015-06-05: qty 10

## 2015-06-05 MED ORDER — ACETAMINOPHEN 10 MG/ML IV SOLN
1000.0000 mg | Freq: Four times a day (QID) | INTRAVENOUS | Status: AC | PRN
  Administered 2015-06-05: 1000 mg via INTRAVENOUS
  Filled 2015-06-05 (×2): qty 100

## 2015-06-05 MED ORDER — OXYCODONE HCL 5 MG/5ML PO SOLN: 5.0000 mg | Freq: Once | ORAL | Status: DC | PRN

## 2015-06-05 MED ORDER — NALOXONE HCL 2 MG/2ML IJ SOSY: 1.0000 ug/kg/h | PREFILLED_SYRINGE | INTRAVENOUS | Status: DC | PRN

## 2015-06-05 MED ORDER — BUPIVACAINE IN DEXTROSE 0.75-8.25 % IT SOLN
INTRATHECAL | Status: DC | PRN
  Administered 2015-06-05: 1.7 mL via INTRATHECAL

## 2015-06-05 MED ORDER — LACTATED RINGERS IV SOLN
INTRAVENOUS | Status: DC | PRN
  Administered 2015-06-05: 13:00:00 via INTRAVENOUS

## 2015-06-05 MED ORDER — SCOPOLAMINE 1 MG/3DAYS TD PT72
1.0000 | MEDICATED_PATCH | Freq: Once | TRANSDERMAL | Status: DC
  Administered 2015-06-05: 1.5 mg via TRANSDERMAL

## 2015-06-05 MED ORDER — NALBUPHINE HCL 10 MG/ML IJ SOLN
5.0000 mg | INTRAMUSCULAR | Status: DC | PRN
  Administered 2015-06-05 – 2015-06-06 (×4): 5 mg via INTRAVENOUS
  Filled 2015-06-05 (×4): qty 1

## 2015-06-05 MED ORDER — LANOLIN HYDROUS EX OINT: 1.0000 "application " | TOPICAL_OINTMENT | CUTANEOUS | Status: DC | PRN

## 2015-06-05 MED ORDER — ZOLPIDEM TARTRATE 5 MG PO TABS: 5.0000 mg | ORAL_TABLET | Freq: Every evening | ORAL | Status: DC | PRN

## 2015-06-05 MED ORDER — ACETAMINOPHEN 325 MG PO TABS: 650.0000 mg | ORAL_TABLET | ORAL | Status: DC | PRN

## 2015-06-05 MED ORDER — OXYTOCIN 10 UNIT/ML IJ SOLN
40.0000 [IU] | INTRAVENOUS | Status: DC | PRN
  Administered 2015-06-05: 40 [IU] via INTRAVENOUS

## 2015-06-05 MED ORDER — FENTANYL CITRATE (PF) 100 MCG/2ML IJ SOLN
INTRAMUSCULAR | Status: AC
  Filled 2015-06-05: qty 2

## 2015-06-05 MED ORDER — NALBUPHINE HCL 10 MG/ML IJ SOLN: 5.0000 mg | Freq: Once | INTRAMUSCULAR | Status: DC | PRN

## 2015-06-05 MED ORDER — DIBUCAINE 1 % RE OINT: 1.0000 "application " | TOPICAL_OINTMENT | RECTAL | Status: DC | PRN

## 2015-06-05 MED ORDER — NALOXONE HCL 0.4 MG/ML IJ SOLN: 0.4000 mg | INTRAMUSCULAR | Status: DC | PRN

## 2015-06-05 MED ORDER — NALBUPHINE HCL 10 MG/ML IJ SOLN: 5.0000 mg | INTRAMUSCULAR | Status: DC | PRN

## 2015-06-05 MED ORDER — OXYCODONE-ACETAMINOPHEN 5-325 MG PO TABS
1.0000 | ORAL_TABLET | Freq: Four times a day (QID) | ORAL | Status: DC | PRN
  Administered 2015-06-06 (×2): 2 via ORAL
  Administered 2015-06-06: 1 via ORAL
  Administered 2015-06-06: 2 via ORAL
  Administered 2015-06-07: 1 via ORAL
  Administered 2015-06-07: 2 via ORAL
  Filled 2015-06-05: qty 1
  Filled 2015-06-05: qty 2
  Filled 2015-06-05: qty 1
  Filled 2015-06-05 (×2): qty 2
  Filled 2015-06-05: qty 1
  Filled 2015-06-05: qty 2

## 2015-06-05 MED ORDER — DIPHENHYDRAMINE HCL 25 MG PO CAPS
25.0000 mg | ORAL_CAPSULE | Freq: Four times a day (QID) | ORAL | Status: DC | PRN
  Administered 2015-06-05: 25 mg via ORAL
  Filled 2015-06-05: qty 1

## 2015-06-05 MED ORDER — LACTATED RINGERS IV SOLN
Freq: Once | INTRAVENOUS | Status: AC
  Administered 2015-06-05: 11:00:00 via INTRAVENOUS

## 2015-06-05 MED ORDER — SIMETHICONE 80 MG PO CHEW
80.0000 mg | CHEWABLE_TABLET | ORAL | Status: DC
  Administered 2015-06-06 – 2015-06-07 (×2): 80 mg via ORAL
  Filled 2015-06-05 (×2): qty 1

## 2015-06-05 MED ORDER — SENNOSIDES-DOCUSATE SODIUM 8.6-50 MG PO TABS
2.0000 | ORAL_TABLET | ORAL | Status: DC
  Administered 2015-06-06 – 2015-06-07 (×2): 2 via ORAL
  Filled 2015-06-05 (×2): qty 2

## 2015-06-05 MED ORDER — PHENYLEPHRINE 8 MG IN D5W 100 ML (0.08MG/ML) PREMIX OPTIME
INJECTION | INTRAVENOUS | Status: AC
  Filled 2015-06-05: qty 100

## 2015-06-05 MED ORDER — OXYCODONE HCL 5 MG PO TABS: 5.0000 mg | ORAL_TABLET | Freq: Once | ORAL | Status: DC | PRN

## 2015-06-05 MED ORDER — ALBUTEROL SULFATE (2.5 MG/3ML) 0.083% IN NEBU
3.0000 mL | INHALATION_SOLUTION | Freq: Four times a day (QID) | RESPIRATORY_TRACT | Status: DC | PRN
  Administered 2015-06-06: 3 mL via RESPIRATORY_TRACT
  Filled 2015-06-05 (×2): qty 3

## 2015-06-05 MED ORDER — MEPERIDINE HCL 25 MG/ML IJ SOLN: 6.2500 mg | INTRAMUSCULAR | Status: DC | PRN

## 2015-06-05 MED ORDER — SODIUM CHLORIDE 0.9 % IJ SOLN: 3.0000 mL | INTRAMUSCULAR | Status: DC | PRN

## 2015-06-05 MED ORDER — BUDESONIDE-FORMOTEROL FUMARATE 80-4.5 MCG/ACT IN AERO
2.0000 | INHALATION_SPRAY | Freq: Every day | RESPIRATORY_TRACT | Status: DC
  Administered 2015-06-06 – 2015-06-07 (×2): 2 via RESPIRATORY_TRACT
  Filled 2015-06-05: qty 6.9

## 2015-06-05 MED ORDER — MORPHINE SULFATE (PF) 0.5 MG/ML IJ SOLN
INTRAMUSCULAR | Status: DC | PRN
  Administered 2015-06-05: .4 mg via INTRATHECAL

## 2015-06-05 MED ORDER — WITCH HAZEL-GLYCERIN EX PADS: 1.0000 "application " | MEDICATED_PAD | CUTANEOUS | Status: DC | PRN

## 2015-06-05 MED ORDER — LACTATED RINGERS IV SOLN
INTRAVENOUS | Status: DC
  Administered 2015-06-05: 21:00:00 via INTRAVENOUS

## 2015-06-05 MED ORDER — SCOPOLAMINE 1 MG/3DAYS TD PT72
MEDICATED_PATCH | TRANSDERMAL | Status: AC
  Administered 2015-06-05: 1.5 mg via TRANSDERMAL
  Filled 2015-06-05: qty 1

## 2015-06-05 MED ORDER — ONDANSETRON HCL 4 MG/2ML IJ SOLN
INTRAMUSCULAR | Status: AC
  Filled 2015-06-05: qty 2

## 2015-06-05 MED ORDER — OXYTOCIN 10 UNIT/ML IJ SOLN
INTRAMUSCULAR | Status: AC
  Filled 2015-06-05: qty 4

## 2015-06-05 MED ORDER — HYDROMORPHONE HCL 1 MG/ML IJ SOLN: 0.2500 mg | INTRAMUSCULAR | Status: DC | PRN

## 2015-06-05 MED ORDER — FENTANYL CITRATE (PF) 100 MCG/2ML IJ SOLN
INTRAMUSCULAR | Status: DC | PRN
  Administered 2015-06-05: 10 ug via INTRATHECAL

## 2015-06-05 MED ORDER — LACTATED RINGERS IV SOLN
INTRAVENOUS | Status: DC
Start: 2015-06-05 — End: 2015-06-05
  Administered 2015-06-05 (×3): via INTRAVENOUS

## 2015-06-05 MED ORDER — ACETAMINOPHEN 500 MG PO TABS
1000.0000 mg | ORAL_TABLET | Freq: Four times a day (QID) | ORAL | Status: AC
  Administered 2015-06-05 – 2015-06-06 (×2): 1000 mg via ORAL
  Filled 2015-06-05 (×2): qty 2

## 2015-06-05 SURGICAL SUPPLY — 31 items
CLAMP CORD UMBIL (MISCELLANEOUS) IMPLANT
CLOTH BEACON ORANGE TIMEOUT ST (SAFETY) ×3 IMPLANT
DRAPE SHEET LG 3/4 BI-LAMINATE (DRAPES) IMPLANT
DRSG OPSITE POSTOP 4X10 (GAUZE/BANDAGES/DRESSINGS) ×3 IMPLANT
DURAPREP 26ML APPLICATOR (WOUND CARE) ×3 IMPLANT
ELECT REM PT RETURN 9FT ADLT (ELECTROSURGICAL) ×3
ELECTRODE REM PT RTRN 9FT ADLT (ELECTROSURGICAL) ×1 IMPLANT
EXTRACTOR VACUUM M CUP 4 TUBE (SUCTIONS) IMPLANT
EXTRACTOR VACUUM M CUP 4' TUBE (SUCTIONS)
GLOVE BIO SURGEON STRL SZ7 (GLOVE) ×3 IMPLANT
GLOVE BIOGEL PI IND STRL 7.0 (GLOVE) ×1 IMPLANT
GLOVE BIOGEL PI INDICATOR 7.0 (GLOVE) ×2
GOWN STRL REUS W/TWL LRG LVL3 (GOWN DISPOSABLE) ×6 IMPLANT
KIT ABG SYR 3ML LUER SLIP (SYRINGE) IMPLANT
LIQUID BAND (GAUZE/BANDAGES/DRESSINGS) ×3 IMPLANT
NEEDLE HYPO 22GX1.5 SAFETY (NEEDLE) IMPLANT
NEEDLE HYPO 25X5/8 SAFETYGLIDE (NEEDLE) IMPLANT
NS IRRIG 1000ML POUR BTL (IV SOLUTION) ×3 IMPLANT
PACK C SECTION WH (CUSTOM PROCEDURE TRAY) ×3 IMPLANT
PAD OB MATERNITY 4.3X12.25 (PERSONAL CARE ITEMS) ×3 IMPLANT
PENCIL SMOKE EVAC W/HOLSTER (ELECTROSURGICAL) ×3 IMPLANT
RTRCTR C-SECT PINK 25CM LRG (MISCELLANEOUS) ×3 IMPLANT
SUT CHROMIC 1 CTX 36 (SUTURE) ×6 IMPLANT
SUT CHROMIC 2 0 CT 1 (SUTURE) ×3 IMPLANT
SUT PDS AB 0 CTX 60 (SUTURE) ×3 IMPLANT
SUT VIC AB 2-0 CT1 27 (SUTURE) ×2
SUT VIC AB 2-0 CT1 TAPERPNT 27 (SUTURE) ×1 IMPLANT
SUT VIC AB 4-0 KS 27 (SUTURE) IMPLANT
SYR 30ML LL (SYRINGE) IMPLANT
TOWEL OR 17X24 6PK STRL BLUE (TOWEL DISPOSABLE) ×3 IMPLANT
TRAY FOLEY CATH SILVER 14FR (SET/KITS/TRAYS/PACK) ×3 IMPLANT

## 2015-06-05 NOTE — Anesthesia Preprocedure Evaluation (Addendum)
Anesthesia Evaluation  Patient identified by MRN, date of birth, ID band Patient awake    Reviewed: Allergy & Precautions, NPO status , Patient's Chart, lab work & pertinent test results  Airway Mallampati: I  TM Distance: >3 FB Neck ROM: Full    Dental  (+) Teeth Intact, Dental Advisory Given   Pulmonary asthma , former smoker,    breath sounds clear to auscultation       Cardiovascular  Rhythm:Regular Rate:Normal     Neuro/Psych    GI/Hepatic GERD  Medicated and Controlled,  Endo/Other  Morbid obesity  Renal/GU      Musculoskeletal   Abdominal   Peds  Hematology   Anesthesia Other Findings   Reproductive/Obstetrics                             Anesthesia Physical Anesthesia Plan  ASA: III  Anesthesia Plan: Spinal   Post-op Pain Management:    Induction: Intravenous  Airway Management Planned: Natural Airway  Additional Equipment:   Intra-op Plan:   Post-operative Plan:   Informed Consent: I have reviewed the patients History and Physical, chart, labs and discussed the procedure including the risks, benefits and alternatives for the proposed anesthesia with the patient or authorized representative who has indicated his/her understanding and acceptance.   Dental advisory given  Plan Discussed with: CRNA, Anesthesiologist and Surgeon  Anesthesia Plan Comments:         Anesthesia Quick Evaluation

## 2015-06-05 NOTE — H&P (Signed)
Shannon Aguirre is a 30 y.o. female presenting for repeat cesarean section  30 yo G2P1001 @ 39+3 presents repeat cesarean section. Her pregnancy has been complicated by obesity. History OB History    Gravida Para Term Preterm AB TAB SAB Ectopic Multiple Living   2 1 1       0 1     Past Medical History  Diagnosis Date  . Asthma   . Allergy   . Depression   . PTSD (post-traumatic stress disorder)   . Headache   . Dissociative identity disorder   . GERD (gastroesophageal reflux disease)     with pregnancy  . Anemia    Past Surgical History  Procedure Laterality Date  . Breast surgery  x 2    breast reduction and lumpectomy  . Cesarean section N/A 05/10/2014    Procedure: CESAREAN SECTION;  Surgeon: Freddrick MarchKendra H. Tenny Crawoss, MD;  Location: [facility];  Service: Obstetrics;  Laterality: N/A;   Family History: family history includes Asthma in her mother and sister; Bipolar disorder in her mother; Breast cancer in her maternal grandmother; COPD in her maternal aunt; Cancer in her father; Diabetes in her maternal grandfather and maternal grandmother; Sickle cell anemia in her brother. Social History:  reports that she quit smoking about 20 months ago. Her smoking use included Cigarettes. She has a 8 pack-year smoking history. She has never used smokeless tobacco. She reports that she does not drink alcohol or use illicit drugs.   Prenatal Transfer Tool  Maternal Diabetes: No Genetic Screening: Declined Maternal Ultrasounds/Referrals: Normal Fetal Ultrasounds or other Referrals:  None Maternal Substance Abuse:  No Significant Maternal Medications:  None Significant Maternal Lab Results:  None Other Comments:  None  ROS    Blood pressure 118/52, pulse 68, temperature 98.2 F (36.8 C), temperature source Oral, resp. rate 16, SpO2 99 %, unknown if currently breastfeeding. Exam Physical Exam  Prenatal labs: ABO, Rh: --/--/O POS (01/04 0920) Antibody: NEG (01/04 0920) Rubella: Immune (06/02  0000) RPR: Non Reactive (01/04 0920)  HBsAg: Negative (06/02 0000)  HIV: Non-reactive (06/02 0000)  GBS: Negative (12/16 0000)   Assessment/Plan: 1) Admit 2) Consent for repeat cs 3) ANCEF OCTOR 4) SCDs to or   Brylee Mcgreal H. 06/05/2015, 12:09 PM

## 2015-06-05 NOTE — Anesthesia Postprocedure Evaluation (Signed)
Anesthesia Post Note  Patient: Shannon Aguirre  Procedure(s) Performed: Procedure(s) (LRB): CESAREAN SECTION (N/A)  Patient location during evaluation: PACU Anesthesia Type: Spinal Level of consciousness: awake Pain management: satisfactory to patient Vital Signs Assessment: post-procedure vital signs reviewed and stable Respiratory status: spontaneous breathing Cardiovascular status: blood pressure returned to baseline Postop Assessment: no headache and spinal receding Anesthetic complications: no    Last Vitals:  Filed Vitals:   06/05/15 1500 06/05/15 1516  BP: 94/51 98/45  Pulse:  57  Temp: 36.4 C 36.3 C  Resp:  18    Last Pain:  Filed Vitals:   06/05/15 1516  PainSc: 2                  Sherra Kimmons EDWARD

## 2015-06-05 NOTE — Op Note (Signed)
Pre-Operative Diagnosis: 1) 39+3 week Intrauterine pregnancy 2) History of prior cesarean section. 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 Waynard ReedsKendra Heaven Wandell Assistant: Dr. Ilda Moriichard Kaplan Operative Findings: Vigorous female infant in the vertex presentation with apgars of 9 & 9. Unable to visualized ovaries and tubes bilaterally due to adhesive disease.  Specimen: Placenta for disposal EBL: Total I/O In: 2900 [I.V.:2900] Out: 750 [Urine:250; Blood:500]   Procedure:Shannon Aguirre is an 30 year old gravida 2 para 1001 at 339 weeks and 3 days estimated gestational age who presents for cesarean section. Following the appropriate informed consent the patient was brought to the operating room where spinal anesthesia was administered and found to be adequate. She was placed in the dorsal supine position with a leftward tilt. Prior to sterile preparation, the traxi panniculus retractor was placed on the maternal abdomen. The patient was appropriately identified during a pre-operative time out procedure. She was prepped and draped in the normal sterile fashion. Scalpel was then used to make a Pfannenstiel skin incision which was carried down to the underlying layers of soft tissue to the fascia. The fascia was incised in the midline and the fascial incision was extended laterally with Mayo scissors. The superior aspect of the fascial incision was grasped with Coker clamps x2, tented up and the rectus muscles dissected off sharply with the electrocautery unit area and the same procedure was repeated on the inferior aspect of the fascial incision. The rectus muscles were separated in the midline. The abdominal peritoneum was identified, tented up, entered with a combination of sharp and blunt dissection, and the incision was extended  superiorly and inferiorly with good visualization of the bladder. The Alexis retractor was then deployed. The vesicouterine peritoneum was identified, tented up, entered sharply, and the bladder flap was created digitally. Scalpel was then used to make a low transverse incision on the uterus which was extended laterally with blunt dissection . The fetal vertex was identified, delivered easily through the uterine incision followed by the body. The infant was bulb suctioned on the operative field cried vigorously, cord was clamped and cut and the infant was passed to the waiting neonatologist. Placenta was then delivered spontaneously, the uterus was cleared of all clot and debris. The uterine incision was repaired with #1 chromic in running locked fashion. The Alexis retractor was removed. The uterus was returned to the abdominal cavity the abdominal cavity was cleared of all clot and debris. The abdominal peritoneum was reapproximated with 2-0 Vicryl in a running fashion, the rectus muscles was reapproximated with 2-0 chromic in a running fashion. The fascia was closed with a looped PDS in a running fashion. The subcuticular tissue was closed with 2-0 plain cut with interrupted stitches. The skin was closed with 4-0 vicryl in a subcuticular fashion and Dermabond. All sponge lap and needle counts were correct x2. Patient tolerated the procedure well and recovered in stable condition following the procedure.

## 2015-06-05 NOTE — Consult Note (Signed)
Neonatology Note:   Attendance at C-section:   I was asked by Dr. Ross to attend this repeat C/S at term. The mother is a G2P1 O pos, GBS neg with depression, PTSD, and dissociative identity disorder. ROM at delivery, fluid clear. Infant vigorous with good spontaneous cry and tone. Needed only minimal bulb suctioning. Ap 8/9. Lungs clear to ausc in DR. To CN to care of Pediatrician.  Shannon Valeriano C. Leshay Desaulniers, MD 

## 2015-06-05 NOTE — Transfer of Care (Signed)
Immediate Anesthesia Transfer of Care Note  Patient: Shannon Aguirre  Procedure(s) Performed: Procedure(s): CESAREAN SECTION (N/A)  Patient Location: PACU  Anesthesia Type:Spinal  Level of Consciousness: awake, alert  and oriented  Airway & Oxygen Therapy: Patient Spontanous Breathing  Post-op Assessment: Report given to RN and Post -op Vital signs reviewed and stable  Post vital signs: Reviewed and stable  Last Vitals:  Filed Vitals:   06/05/15 1042  BP: 118/52  Pulse: 68  Temp: 36.8 C  Resp: 16    Complications: No apparent anesthesia complications

## 2015-06-05 NOTE — Anesthesia Procedure Notes (Signed)
Spinal Patient location during procedure: OR Staffing Anesthesiologist: Mithra Spano Preanesthetic Checklist Completed: patient identified, site marked, surgical consent, pre-op evaluation, timeout performed, IV checked, risks and benefits discussed and monitors and equipment checked Spinal Block Patient position: sitting Prep: site prepped and draped and DuraPrep Patient monitoring: cardiac monitor, continuous pulse ox and blood pressure Approach: midline Location: L3-4 Injection technique: single-shot Needle Needle type: Introducer and Spinocan  Needle gauge: 24 G Needle length: 9 cm Needle insertion depth: 6 cm Assessment Sensory level: T6

## 2015-06-05 NOTE — Addendum Note (Signed)
Addendum  created 06/05/15 1720 by Cristela BlueKyle Kitti Mcclish, MD   Modules edited: Orders, PRL Based Order Sets

## 2015-06-06 ENCOUNTER — Encounter (HOSPITAL_COMMUNITY): Payer: Self-pay | Admitting: Obstetrics and Gynecology

## 2015-06-06 LAB — CBC
HEMATOCRIT: 30.4 % — AB (ref 36.0–46.0)
Hemoglobin: 9.8 g/dL — ABNORMAL LOW (ref 12.0–15.0)
MCH: 27.4 pg (ref 26.0–34.0)
MCHC: 32.2 g/dL (ref 30.0–36.0)
MCV: 84.9 fL (ref 78.0–100.0)
PLATELETS: 218 10*3/uL (ref 150–400)
RBC: 3.58 MIL/uL — ABNORMAL LOW (ref 3.87–5.11)
RDW: 14.9 % (ref 11.5–15.5)
WBC: 10 10*3/uL (ref 4.0–10.5)

## 2015-06-06 LAB — BIRTH TISSUE RECOVERY COLLECTION (PLACENTA DONATION)

## 2015-06-06 MED ORDER — SERTRALINE HCL 25 MG PO TABS
25.0000 mg | ORAL_TABLET | Freq: Every day | ORAL | Status: DC
  Administered 2015-06-06 – 2015-06-07 (×2): 25 mg via ORAL
  Filled 2015-06-06 (×2): qty 1

## 2015-06-06 MED ORDER — ALUM & MAG HYDROXIDE-SIMETH 200-200-20 MG/5ML PO SUSP
30.0000 mL | Freq: Once | ORAL | Status: AC
  Administered 2015-06-06: 30 mL via ORAL
  Filled 2015-06-06 (×2): qty 30

## 2015-06-06 NOTE — Progress Notes (Signed)
[id number] - Patient c/o right upper chest ache 6/10.  Pt.stateed often experience this at home with asthma.  Lungs are clear with O2 Sat 96% RA.  Nebulizer given by the respiratory.  Patient states she feel better.

## 2015-06-06 NOTE — Anesthesia Postprocedure Evaluation (Signed)
Anesthesia Post Note  Patient: Shannon Aguirre  Procedure(s) Performed: Procedure(s) (LRB): CESAREAN SECTION (N/A)  Patient location during evaluation: Mother Baby Anesthesia Type: Spinal Level of consciousness: awake and alert and oriented Pain management: pain level controlled Vital Signs Assessment: post-procedure vital signs reviewed and stable Respiratory status: spontaneous breathing and respiratory function stable Cardiovascular status: blood pressure returned to baseline Postop Assessment: no headache, no backache, patient able to bend at knees, no signs of nausea or vomiting and adequate PO intake Anesthetic complications: no    Last Vitals:  Filed Vitals:   06/06/15 0345 06/06/15 0603  BP: 104/49 112/49  Pulse: 64 66  Temp: 36.8 C 36.6 C  Resp: 20 20    Last Pain:  Filed Vitals:   06/06/15 0608  PainSc: 4                  Lonnell Chaput

## 2015-06-06 NOTE — Clinical Social Work Maternal (Signed)
CLINICAL SOCIAL WORK MATERNAL/CHILD NOTE  Patient Details  Name: Shannon Aguirre MRN: [id number] Date of Birth: 05/19/1986  Date:  06/06/2015  Clinical Social Worker Initiating Note:  Loleta BooksSarah Taziyah Aguirre MSW, LCSW Date/ Time Initiated:  06/06/15/0945     Child's Name:  Shannon RasJaxon   Legal Guardian:  Shannon BoastAurea Aguirre and Shannon AblerBrandon Aguirre  Need for Interpreter:  None   Date of Referral:  06/05/15     Reason for Referral:  Behavioral Health Issues, including SI    [facility]   Address:  [address] [place], [place] [address]  Phone number:  [phone]   Household Members:  Minor Children, Significant Other   Natural Supports (not living in the home):  Immediate Family, Extended Family   Professional Supports: None   Employment: Full-time   Type of Work:     Education:      Architectinancial Resources:  [employer], [employer]are    Other Resources:  [employer]   Cultural/Religious Considerations Which May Impact Care:  None reported  Strengths:  Home prepared for child , Merchandiser, retailediatrician chosen , Ability to meet basic needs    Risk Factors/Current Problems:  Mental Health Concerns    Cognitive State:  Able to Concentrate , Alert , Goal Oriented , Insightful , Linear Thinking    Mood/Affect:  Happy , Comfortable , Calm , Bright    CSW Assessment:  CSW received request for consult due to MOB presenting with a history of PTSD, bipolar, and depression.  MOB presented in a pleasant mood, displayed a full range in affect, and was easily engaged.  MOB provided consent for assessment to be completed in presence of the FOB.   Prior to meeting with MOB, CSW reviewed CSW assessment in December 2015 after her first son was born. No acute concerns were noted at that time.  MOB confirmed history of PTSD, bipolar, and depression, but stated that she was first diagnosed during early adolescence. She endorsed history of participating in outpatient therapy and medication management, including  history of inpatient psychiatrist admissions.  MOB stated she was most recently on medication prior to her first child being born, but stated that she has had minimal success on any medication other than Zoloft.  MOB discussed fragmented mental health care [place], but stated that her mental health has stabilized and improved.   MOB stated that she has not had a recent diagnostic evaluation, and is unsure if her previous diagnoses are accurate.  CSW reviewed diagnostic criteria for bipolar, and MOB denied any symptoms of mania since [place].   MOB presented as receptive to recommendation to be revaluated in order to determine more accurate diagnosis.   Per MOB, her last transition postpartum was difficult since her ideal nonmedicated birth plan did not occur. She stated that she had to receive pitocin, an epidural, and then had an emergency C-section.  MOB discussed that it was a difficult time adjusting to the change in her birth plan, and shared that she was overwhelmed as a first time mother, and had limited support.  MOB stated that she was never formally diagnosed with postpartum depression, but shared belief that it was "rough".  She also discussed the negative feedback she received for choosing to formula feed her first child, and shared how that negatively impacted her sense of self.  MOB reported that she spoke to her sister and the FOB in order to receive support.    CSW continued  to explore with MOB differing approaches to this postpartum period in order to support her mental health. MOB stated that she has a strong support system, and a plan for her family to stay with her once the FOB returns to work.  She shared belief that this will be helpful. MOB also discussed how she believes that she has more realistic expectations for herself. Previously, she would clean and engage in other household activities when the infant was sleeping, and she now recognizes that she  needs to sleep and it does not matter if her home is perfectly cleaned.  MOB stated that she recognizes that she cannot "do it all", and that high expectations for herself can trigger her anxiety and depression.  MOB stated that she also believes that because she had a planned C-section, she will not have to go through the same adjustment period, since she had time to prepare for it.  CSW continued to explore the MOB's feelings surrounding formula feeding, and she recognizes that there is no proof/evidence that formula caused harm or a negative outcome for her infant. She smiled as she recognized the positive components of formula feeding, and stated that she feels confident and comfortable with her decision.   MOB endorsed presence of anxiety and depressive symptoms during the pregnancy. She stated that she was still able to go to work, but felt that she was crying frequently and intensely, had limited motivation to get out of bed, wanted to isolate, and did not find previously enjoyed activities as enjoyable. She admitted to feeling hopeless and helpless, but denied any SI.  MOB also reported anxiety, including racing thoughts, insomnia, restless, and on edge.  CSW continued to explore with MOB various cognitive techniques to assist with techniques, and MOB smiled and presented to respond well as CSW guided MOB through the techniques. CSW also assisted with the facilitation of communication with the FOB to continue to explore and identify what is helpful for the MOB when she begins to feel anxious.    MOB expressed interest in participating in therapy postpartum. She stated that she would prefer an agency that has later office hours due to her work schedule.  MOB also expressed interest in discussing with her medical provider medications due to intensity and severity of depression and anxiety.  MOB presented as motivated to address her mental health symptoms, and reflected upon the potential benefits of  feeling less anxious and depressed. She reflected upon the negative outcomes of her depression and anxiety, and expressed hope for symptoms to improve in the future.   MOB expressed appreciation for the visit, support, and referrals. She recognized ongoing availability of CSW, and agreed to contact CSW if needs arise.   CSW Plan/Description:   1. Patient/Family Education-- perinatal mood and anxiety disorders 2. CSW present when OB provider rounded on MOB in order to assist with facilitation of discussion regarding medication postpartum.  OB prescribed Zoloft. CSW informed RN of outcome of assessment . 3. Information/Referral to [facility]-- Outpatient mental health resources, Feelings After Birth support group 4. No Further Intervention Required/No Barriers to Discharge    Kelby Fam 06/06/2015, 11:36 AM

## 2015-06-06 NOTE — Addendum Note (Signed)
Addendum  created 06/06/15 0837 by Junious SilkMelinda Jaquail Mclees, CRNA   Modules edited: Clinical Notes   Clinical Notes:  File: [id number]

## 2015-06-06 NOTE — Progress Notes (Signed)
UR chart review completed.  

## 2015-06-06 NOTE — Progress Notes (Signed)
Attempted to get patient up to the bathroom.  Patient complains of light headed/dizziness and chest pain.  MD notified.  Foley not discontinued at this time.

## 2015-06-06 NOTE — Progress Notes (Signed)
Subjective: Postpartum Day 1: Cesarean Delivery Patient reports earlier chest pain has now improved with use of incentive spirometer. Notes itching. Pain well controlled, N/V resolved and now tolerating po. Pt with h/o anxiety/depression. Has been having more anxiety during the later half of her pregnancy but [place] [facility] due to impression that she couldn't take medication in pregnancy. Pt does not plan to breast feed. Interested in starting antidepressant medication. Zoloft has worked well for her in the past  Objective: Vital signs in last 24 hours: Temp:  [97.4 F (36.3 C)-98.2 F (36.8 C)] 97.5 F (36.4 C) (01/06 0914) Pulse Rate:  [50-81] 63 (01/06 0914) Resp:  [12-29] 20 (01/06 0914) BP: (86-120)/(44-94) 108/44 mmHg (01/06 0914) SpO2:  [92 %-100 %] 93 % (01/06 0914)  Physical Exam:  General: alert, cooperative and appears stated age Lochia: appropriate Uterine Fundus: firm Incision: healing well DVT Evaluation: No evidence of DVT seen on physical exam.   Recent Labs  06/04/15 0920 06/06/15 0550  HGB 11.1* 9.8*  HCT 34.3* 30.4*    Assessment/Plan: Status post Cesarean section. Doing well postoperatively.  Continue current care. Anxiety: start zoloft 25mg  po QD x 1 week then increase to 50mg . Pt working w/ SW to get set up with mental health specialist as an outpatient   Kriss Ishler H. 06/06/2015, 10:55 AM

## 2015-06-07 MED ORDER — OXYCODONE-ACETAMINOPHEN 5-325 MG PO TABS: 1.0000 | ORAL_TABLET | ORAL | Status: DC | PRN

## 2015-06-07 MED ORDER — SERTRALINE HCL 25 MG PO TABS: ORAL_TABLET | ORAL | Status: DC

## 2015-06-07 MED ORDER — DOCUSATE SODIUM 100 MG PO CAPS: 100.0000 mg | ORAL_CAPSULE | Freq: Two times a day (BID) | ORAL | Status: DC

## 2015-06-07 NOTE — Discharge Summary (Signed)
Obstetric Discharge Summary Reason for Admission: cesarean section Prenatal Procedures: ultrasound Intrapartum Procedures: cesarean: low cervical, transverse Postpartum Procedures: none Complications-Operative and Postpartum: none HEMOGLOBIN  Date Value Ref Range Status  06/06/2015 9.8* 12.0 - 15.0 g/dL Final  96/04/540901/24/2015 81.112.2 12.2 - 16.2 g/dL Final   HCT  Date Value Ref Range Status  06/06/2015 30.4* 36.0 - 46.0 % Final   HCT, POC  Date Value Ref Range Status  06/23/2013 40.1 37.7 - 47.9 % Final    Physical Exam:  General: alert, cooperative and appears stated age 80Lochia: appropriate Uterine Fundus: firm Incision: healing well DVT Evaluation: No evidence of DVT seen on physical exam.  Discharge Diagnoses: Term Pregnancy-delivered  Discharge Information: Date: 06/07/2015 Activity: pelvic rest Diet: routine Medications: Colace and Percocet Condition: improved Instructions: refer to practice specific booklet Discharge to: home   Newborn Data: Live born female  Birth Weight: 8 lb 9.2 oz (3890 g) APGAR: 8, 9  Home with mother.  Shannon Peary H. 06/07/2015, 9:27 AM

## 2015-06-07 NOTE — Discharge Instructions (Signed)

## 2015-06-08 LAB — TYPE AND SCREEN
ABO/RH(D): O POS
Antibody Screen: NEGATIVE
UNIT DIVISION: 0
UNIT DIVISION: 0
Unit division: 0
Unit division: 0

## 2015-06-28 ENCOUNTER — Inpatient Hospital Stay (HOSPITAL_COMMUNITY)
Admission: AD | Admit: 2015-06-28 | Discharge: 2015-06-30 | DRG: 769 | Disposition: A | Discharge status: Home / Self Care | Payer: Medicare Other | Source: Ambulatory Visit | Attending: General Surgery | Admitting: General Surgery

## 2015-06-28 ENCOUNTER — Inpatient Hospital Stay (HOSPITAL_COMMUNITY): Payer: Medicare Other

## 2015-06-28 ENCOUNTER — Encounter (HOSPITAL_COMMUNITY): Payer: Self-pay | Admitting: *Deleted

## 2015-06-28 DIAGNOSIS — D649 Anemia, unspecified: Secondary | ICD-10-CM | POA: Diagnosis present

## 2015-06-28 DIAGNOSIS — F4310 Post-traumatic stress disorder, unspecified: Secondary | ICD-10-CM | POA: Diagnosis present

## 2015-06-28 DIAGNOSIS — O99215 Obesity complicating the puerperium: Secondary | ICD-10-CM | POA: Diagnosis present

## 2015-06-28 DIAGNOSIS — Z6841 Body Mass Index (BMI) 40.0 and over, adult: Secondary | ICD-10-CM | POA: Diagnosis not present

## 2015-06-28 DIAGNOSIS — K851 Biliary acute pancreatitis without necrosis or infection: Secondary | ICD-10-CM | POA: Diagnosis present

## 2015-06-28 DIAGNOSIS — J45909 Unspecified asthma, uncomplicated: Secondary | ICD-10-CM | POA: Diagnosis present

## 2015-06-28 DIAGNOSIS — K219 Gastro-esophageal reflux disease without esophagitis: Secondary | ICD-10-CM | POA: Diagnosis present

## 2015-06-28 DIAGNOSIS — O9953 Diseases of the respiratory system complicating the puerperium: Secondary | ICD-10-CM | POA: Diagnosis present

## 2015-06-28 DIAGNOSIS — K8020 Calculus of gallbladder without cholecystitis without obstruction: Secondary | ICD-10-CM

## 2015-06-28 DIAGNOSIS — R7401 Elevation of levels of liver transaminase levels: Secondary | ICD-10-CM

## 2015-06-28 DIAGNOSIS — F329 Major depressive disorder, single episode, unspecified: Secondary | ICD-10-CM | POA: Diagnosis present

## 2015-06-28 DIAGNOSIS — Z886 Allergy status to analgesic agent status: Secondary | ICD-10-CM | POA: Diagnosis not present

## 2015-06-28 DIAGNOSIS — E669 Obesity, unspecified: Secondary | ICD-10-CM | POA: Diagnosis present

## 2015-06-28 DIAGNOSIS — F4481 Dissociative identity disorder: Secondary | ICD-10-CM | POA: Diagnosis present

## 2015-06-28 DIAGNOSIS — Z419 Encounter for procedure for purposes other than remedying health state, unspecified: Secondary | ICD-10-CM

## 2015-06-28 DIAGNOSIS — O99345 Other mental disorders complicating the puerperium: Secondary | ICD-10-CM | POA: Diagnosis present

## 2015-06-28 DIAGNOSIS — O9081 Anemia of the puerperium: Secondary | ICD-10-CM | POA: Diagnosis present

## 2015-06-28 DIAGNOSIS — R109 Unspecified abdominal pain: Secondary | ICD-10-CM | POA: Diagnosis present

## 2015-06-28 DIAGNOSIS — Z87891 Personal history of nicotine dependence: Secondary | ICD-10-CM

## 2015-06-28 DIAGNOSIS — O9963 Diseases of the digestive system complicating the puerperium: Secondary | ICD-10-CM | POA: Diagnosis present

## 2015-06-28 DIAGNOSIS — R740 Nonspecific elevation of levels of transaminase and lactic acid dehydrogenase [LDH]: Secondary | ICD-10-CM

## 2015-06-28 DIAGNOSIS — R748 Abnormal levels of other serum enzymes: Secondary | ICD-10-CM

## 2015-06-28 LAB — URINE MICROSCOPIC-ADD ON

## 2015-06-28 LAB — COMPREHENSIVE METABOLIC PANEL
ALBUMIN: 3.1 g/dL — AB (ref 3.5–5.0)
ALT: 191 U/L — ABNORMAL HIGH (ref 14–54)
ANION GAP: 9 (ref 5–15)
AST: 325 U/L — AB (ref 15–41)
Alkaline Phosphatase: 150 U/L — ABNORMAL HIGH (ref 38–126)
BILIRUBIN TOTAL: 1 mg/dL (ref 0.3–1.2)
BUN: 7 mg/dL (ref 6–20)
CO2: 23 mmol/L (ref 22–32)
Calcium: 9.1 mg/dL (ref 8.9–10.3)
Chloride: 107 mmol/L (ref 101–111)
Creatinine, Ser: 0.67 mg/dL (ref 0.44–1.00)
GFR calc Af Amer: >60 mL/min (ref >60)
Glucose, Bld: 95 mg/dL (ref 65–99)
POTASSIUM: 4.1 mmol/L (ref 3.5–5.1)
Sodium: 139 mmol/L (ref 135–145)
TOTAL PROTEIN: 7.2 g/dL (ref 6.5–8.1)

## 2015-06-28 LAB — CBC WITH DIFFERENTIAL/PLATELET
BASOS ABS: 0 10*3/uL (ref 0.0–0.1)
BASOS PCT: 0 %
EOS PCT: 1 %
Eosinophils Absolute: 0.1 10*3/uL (ref 0.0–0.7)
HEMATOCRIT: 37.6 % (ref 36.0–46.0)
Hemoglobin: 11.8 g/dL — ABNORMAL LOW (ref 12.0–15.0)
Lymphocytes Relative: 24 %
Lymphs Abs: 1.8 10*3/uL (ref 0.7–4.0)
MCH: 26.3 pg (ref 26.0–34.0)
MCHC: 31.4 g/dL (ref 30.0–36.0)
MCV: 83.7 fL (ref 78.0–100.0)
MONO ABS: 0.6 10*3/uL (ref 0.1–1.0)
Monocytes Relative: 8 %
NEUTROS ABS: 4.9 10*3/uL (ref 1.7–7.7)
Neutrophils Relative %: 67 %
Platelets: 400 10*3/uL (ref 150–400)
RBC: 4.49 MIL/uL (ref 3.87–5.11)
RDW: 13.7 % (ref 11.5–15.5)
WBC: 7.4 10*3/uL (ref 4.0–10.5)

## 2015-06-28 LAB — URINALYSIS, ROUTINE W REFLEX MICROSCOPIC
Bilirubin Urine: NEGATIVE
Glucose, UA: NEGATIVE mg/dL
Ketones, ur: NEGATIVE mg/dL
LEUKOCYTES UA: NEGATIVE
NITRITE: NEGATIVE
PROTEIN: NEGATIVE mg/dL
SPECIFIC GRAVITY, URINE: 1.015 (ref 1.005–1.030)
pH: 7 (ref 5.0–8.0)

## 2015-06-28 LAB — SURGICAL PCR SCREEN
MRSA, PCR: NEGATIVE
STAPHYLOCOCCUS AUREUS: NEGATIVE

## 2015-06-28 LAB — LIPASE, BLOOD: LIPASE: 257 U/L — AB (ref 11–51)

## 2015-06-28 LAB — POCT PREGNANCY, URINE: PREG TEST UR: NEGATIVE

## 2015-06-28 MED ORDER — BUDESONIDE-FORMOTEROL FUMARATE 80-4.5 MCG/ACT IN AERO
2.0000 | INHALATION_SPRAY | Freq: Every day | RESPIRATORY_TRACT | Status: DC
  Administered 2015-06-30: 2 via RESPIRATORY_TRACT
  Filled 2015-06-28: qty 6.9

## 2015-06-28 MED ORDER — ENOXAPARIN SODIUM 40 MG/0.4ML ~~LOC~~ SOLN
40.0000 mg | SUBCUTANEOUS | Status: DC
  Administered 2015-06-29: 40 mg via SUBCUTANEOUS
  Filled 2015-06-28 (×2): qty 0.4

## 2015-06-28 MED ORDER — FERROUS SULFATE 325 (65 FE) MG PO TABS
325.0000 mg | ORAL_TABLET | Freq: Every day | ORAL | Status: DC
  Administered 2015-06-30: 325 mg via ORAL
  Filled 2015-06-28: qty 1

## 2015-06-28 MED ORDER — HYDROMORPHONE HCL 1 MG/ML IJ SOLN
0.5000 mg | Freq: Once | INTRAMUSCULAR | Status: AC
  Administered 2015-06-28: 0.5 mg via INTRAVENOUS
  Filled 2015-06-28: qty 1

## 2015-06-28 MED ORDER — PRENATAL MULTIVITAMIN CH
1.0000 | ORAL_TABLET | Freq: Every day | ORAL | Status: DC
Start: 2015-06-29 — End: 2015-06-30
  Filled 2015-06-28 (×2): qty 1

## 2015-06-28 MED ORDER — DOCUSATE SODIUM 100 MG PO CAPS
100.0000 mg | ORAL_CAPSULE | Freq: Two times a day (BID) | ORAL | Status: DC
  Administered 2015-06-28 – 2015-06-29 (×2): 100 mg via ORAL
  Filled 2015-06-28 (×3): qty 1

## 2015-06-28 MED ORDER — ONDANSETRON HCL 4 MG/2ML IJ SOLN
4.0000 mg | Freq: Four times a day (QID) | INTRAMUSCULAR | Status: DC | PRN
  Administered 2015-06-28 – 2015-06-30 (×3): 4 mg via INTRAVENOUS
  Filled 2015-06-28 (×3): qty 2

## 2015-06-28 MED ORDER — ALBUTEROL SULFATE (2.5 MG/3ML) 0.083% IN NEBU: 2.5000 mg | INHALATION_SOLUTION | Freq: Four times a day (QID) | RESPIRATORY_TRACT | Status: DC | PRN

## 2015-06-28 MED ORDER — OXYCODONE-ACETAMINOPHEN 5-325 MG PO TABS
1.0000 | ORAL_TABLET | ORAL | Status: DC | PRN
  Administered 2015-06-28 (×2): 1 via ORAL
  Administered 2015-06-29 – 2015-06-30 (×2): 2 via ORAL
  Filled 2015-06-28 (×2): qty 1
  Filled 2015-06-28 (×2): qty 2

## 2015-06-28 MED ORDER — ONDANSETRON 4 MG PO TBDP: 4.0000 mg | ORAL_TABLET | Freq: Four times a day (QID) | ORAL | Status: DC | PRN

## 2015-06-28 MED ORDER — KCL IN DEXTROSE-NACL 20-5-0.45 MEQ/L-%-% IV SOLN
INTRAVENOUS | Status: DC
  Administered 2015-06-28 – 2015-06-29 (×2): 1 mL via INTRAVENOUS
  Administered 2015-06-29 – 2015-06-30 (×2): via INTRAVENOUS
  Filled 2015-06-28 (×7): qty 1000

## 2015-06-28 MED ORDER — DEXTROSE 5 % IV SOLN
2.0000 g | INTRAVENOUS | Status: AC
  Administered 2015-06-29: 2 g via INTRAVENOUS
  Filled 2015-06-28 (×2): qty 2

## 2015-06-28 MED ORDER — HYDROMORPHONE HCL 1 MG/ML IJ SOLN
1.0000 mg | Freq: Once | INTRAMUSCULAR | Status: AC
  Administered 2015-06-28: 1 mg via INTRAMUSCULAR
  Filled 2015-06-28: qty 1

## 2015-06-28 MED ORDER — PANTOPRAZOLE SODIUM 40 MG IV SOLR
40.0000 mg | Freq: Every day | INTRAVENOUS | Status: DC
  Administered 2015-06-28 – 2015-06-29 (×2): 40 mg via INTRAVENOUS
  Filled 2015-06-28 (×2): qty 40

## 2015-06-28 MED ORDER — SERTRALINE HCL 50 MG PO TABS
25.0000 mg | ORAL_TABLET | Freq: Every day | ORAL | Status: DC
  Administered 2015-06-28: 25 mg via ORAL
  Filled 2015-06-28 (×2): qty 1

## 2015-06-28 MED ORDER — ALBUTEROL SULFATE HFA 108 (90 BASE) MCG/ACT IN AERS: 2.0000 | INHALATION_SPRAY | Freq: Four times a day (QID) | RESPIRATORY_TRACT | Status: DC | PRN

## 2015-06-28 MED ORDER — HYDROMORPHONE HCL 1 MG/ML IJ SOLN: 0.5000 mg | INTRAMUSCULAR | Status: DC | PRN

## 2015-06-28 NOTE — H&P (Signed)
Shannon Aguirre is an 30 y.o. female.   Chief Complaint: Abdominal pain. HPI: Started having abdominal pain Thursday, originally thought to be related to her recent C-section, but when evaluated at [facility], it was fetl as though som other etiology was likely.  She was sent to [facility] for evaluationa nd found to have likely biliary colic and gallstone pancreatitis.  Past Medical History  Diagnosis Date  . Asthma   . Allergy   . Headache   . GERD (gastroesophageal reflux disease)     with pregnancy  . Anemia   . Depression   . PTSD (post-traumatic stress disorder)   . Dissociative identity disorder     Past Surgical History  Procedure Laterality Date  . Breast surgery  x 2    breast reduction and lumpectomy  . Cesarean section N/A 05/10/2014    Procedure: CESAREAN SECTION;  Surgeon: Farrel Gobble. Harrington Challenger, MD;  Location: [facility];  Service: Obstetrics;  Laterality: N/A;  . Cesarean section N/A 06/05/2015    Procedure: CESAREAN SECTION;  Surgeon: Vanessa Kick, MD;  Location: [facility];  Service: Obstetrics;  Laterality: N/A;    Family History  Problem Relation Age of Onset  . Bipolar disorder Mother   . Cancer Father   . Sickle cell anemia Brother   . Breast cancer Maternal Grandmother   . Diabetes Maternal Grandmother   . Diabetes Maternal Grandfather   . Asthma Mother   . Asthma Sister   . Asthma      nephew  . COPD Maternal Aunt    Social History:  reports that she quit smoking about 21 months ago. Her smoking use included Cigarettes. She has a 8 pack-year smoking history. She has never used smokeless tobacco. She reports that she does not drink alcohol or use illicit drugs.  Allergies:  Allergies  Allergen Reactions  . Nsaids Anaphylaxis  . Soybean-Containing Drug Products Swelling    Swelling of throat Swelling of lips     (Not in a hospital admission)  Results for orders placed or performed during the hospital encounter of 06/28/15 (from the past 48 hour(s))  Urinalysis,  Routine w reflex microscopic (not at [facility])     Status: Abnormal   Collection Time: 06/28/15 10:20 AM  Result Value Ref Range   Color, Urine YELLOW YELLOW   APPearance CLEAR CLEAR   Specific Gravity, Urine 1.015 1.005 - 1.030   pH 7.0 5.0 - 8.0   Glucose, UA NEGATIVE NEGATIVE mg/dL   Hgb urine dipstick TRACE (A) NEGATIVE   Bilirubin Urine NEGATIVE NEGATIVE   Ketones, ur NEGATIVE NEGATIVE mg/dL   Protein, ur NEGATIVE NEGATIVE mg/dL   Nitrite NEGATIVE NEGATIVE   Leukocytes, UA NEGATIVE NEGATIVE  Urine microscopic-add on     Status: Abnormal   Collection Time: 06/28/15 10:20 AM  Result Value Ref Range   Squamous Epithelial / LPF 0-5 (A) NONE SEEN   WBC, UA 0-5 0 - 5 WBC/hpf   RBC / HPF 0-5 0 - 5 RBC/hpf   Bacteria, UA FEW (A) NONE SEEN  Pregnancy, urine POC     Status: None   Collection Time: 06/28/15 10:23 AM  Result Value Ref Range   Preg Test, Ur NEGATIVE NEGATIVE    Comment:        THE SENSITIVITY OF THIS METHODOLOGY IS >24 mIU/mL   CBC with Differential/Platelet     Status: Abnormal   Collection Time: 06/28/15  1:15 PM  Result Value Ref Range   WBC 7.4 4.0 -  10.5 K/uL   RBC 4.49 3.87 - 5.11 MIL/uL   Hemoglobin 11.8 (L) 12.0 - 15.0 g/dL   HCT 37.6 36.0 - 46.0 %   MCV 83.7 78.0 - 100.0 fL   MCH 26.3 26.0 - 34.0 pg   MCHC 31.4 30.0 - 36.0 g/dL   RDW 13.7 11.5 - 15.5 %   Platelets 400 150 - 400 K/uL   Neutrophils Relative % 67 %   Neutro Abs 4.9 1.7 - 7.7 K/uL   Lymphocytes Relative 24 %   Lymphs Abs 1.8 0.7 - 4.0 K/uL   Monocytes Relative 8 %   Monocytes Absolute 0.6 0.1 - 1.0 K/uL   Eosinophils Relative 1 %   Eosinophils Absolute 0.1 0.0 - 0.7 K/uL   Basophils Relative 0 %   Basophils Absolute 0.0 0.0 - 0.1 K/uL  Comprehensive metabolic panel     Status: Abnormal   Collection Time: 06/28/15  1:15 PM  Result Value Ref Range   Sodium 139 135 - 145 mmol/L   Potassium 4.1 3.5 - 5.1 mmol/L   Chloride 107 101 - 111 mmol/L   CO2 23 22 - 32 mmol/L   Glucose, Bld 95 65  - 99 mg/dL   BUN 7 6 - 20 mg/dL   Creatinine, Ser 0.67 0.44 - 1.00 mg/dL   Calcium 9.1 8.9 - 10.3 mg/dL   Total Protein 7.2 6.5 - 8.1 g/dL   Albumin 3.1 (L) 3.5 - 5.0 g/dL   AST 325 (H) 15 - 41 U/L   ALT 191 (H) 14 - 54 U/L   Alkaline Phosphatase 150 (H) 38 - 126 U/L   Total Bilirubin 1.0 0.3 - 1.2 mg/dL   GFR calc non Af Amer >60 >60 mL/min   GFR calc Af Amer >60 >60 mL/min    Comment: (NOTE) The eGFR has been calculated using the CKD EPI equation. This calculation has not been validated in all clinical situations. eGFR's persistently <60 mL/min signify possible Chronic Kidney Disease.    Anion gap 9 5 - 15  Lipase, blood     Status: Abnormal   Collection Time: 06/28/15  1:15 PM  Result Value Ref Range   Lipase 257 (H) 11 - 51 U/L   US Abdomen Complete  06/28/2015  CLINICAL DATA:  Right upper quadrant abdominal pain, acute onset this morning. 2 weeks postpartum from C-section delivery. EXAM: ABDOMEN ULTRASOUND COMPLETE COMPARISON:  07/13/2013 FINDINGS: Gallbladder: Numerous small less than 1 cm gallstones are seen. No evidence of gallbladder wall thickening or pericholecystic fluid. No sonographic Murphy sign noted by sonographer. Common bile duct: Diameter: 6 mm, within normal limits. Liver: No focal lesion identified. Within normal limits in parenchymal echogenicity. IVC: No abnormality visualized. Pancreas: Visualized portion unremarkable. Spleen: Size and appearance within normal limits. Right Kidney: Length: 13.0 cm. Echogenicity within normal limits. No mass or hydronephrosis visualized. Left Kidney: Length: 11.4 cm. Echogenicity within normal limits. No mass or hydronephrosis visualized. Abdominal aorta: No aneurysm visualized. Other findings: None. IMPRESSION: Cholelithiasis. No sonographic signs of acute cholecystitis, biliary dilatation, or other acute findings. No evidence of hydronephrosis. Electronically Signed   By: Earle Gell M.D.   On: 06/28/2015 15:28    Review of  Systems  Constitutional: Negative for fever and chills.  HENT: Negative.   Eyes: Negative.   Respiratory: Negative.   Cardiovascular: Negative.   Gastrointestinal: Positive for nausea (On and off nausea), vomiting (Once this morning) and abdominal pain. Negative for diarrhea and constipation.  Genitourinary: Negative.  Skin: Negative.   All other systems reviewed and are negative.   Blood pressure 119/71, pulse 54, temperature 97.7 F (36.5 C), temperature source Oral, resp. rate 15, height '5\' 4"'  (1.626 m), weight 140.615 kg (310 lb), SpO2 100 %, unknown if currently breastfeeding. Physical Exam  Constitutional: She is oriented to person, place, and time. She appears well-developed.  Obese post-partum  HENT:  Head: Normocephalic and atraumatic.  Eyes: Pupils are equal, round, and reactive to light.  Neck: Normal range of motion. Neck supple.  Cardiovascular: Normal rate and normal heart sounds.   Respiratory: Effort normal and breath sounds normal.  GI: Soft. Normal appearance. She exhibits no shifting dullness, no distension, no pulsatile liver and no fluid wave. Bowel sounds are decreased. There is tenderness in the right upper quadrant and epigastric area. There is positive Murphy's sign. There is no rigidity, no rebound and no guarding.  Musculoskeletal: Normal range of motion.  Neurological: She is alert and oriented to person, place, and time. She has normal reflexes.  Skin: Skin is warm and dry.  Psychiatric: She has a normal mood and affect. Her behavior is normal. Judgment and thought content normal.     Assessment/Plan Cholelithiasis with abnormal LFTs and elevated lipase level--pancreatitis. No acute cholecystitis by ultrasound, although the patient is tender in the RUQ  Admit for pain control, repeat labs and likely laparoscopic cholecystectomy in the next 24-48 hours unles her LFTs do not continue to resolve  Gatsby Chismar 06/28/2015, 5:04 PM

## 2015-06-28 NOTE — ED Notes (Signed)
Patient transported to Ultrasound 

## 2015-06-28 NOTE — ED Notes (Signed)
[facility] reports states severe upper abdominal pain with N/V starting this am.  Hx of c-section on 06/05/15.  Denies diarrhea or fever.

## 2015-06-28 NOTE — ED Provider Notes (Signed)
CSN: [id number]     Arrival date & time 06/28/15  1000 History   First MD Initiated Contact with Patient 06/28/15 1304     Chief Complaint  Patient presents with  . Abdominal Pain  . Nausea  . Emesis     (Consider location/radiation/quality/duration/timing/severity/associated sxs/prior Treatment) HPI Comments: Patient with history of C-section performed on 06/05/15 presents emergency department from [facility] with complaint of right upper quadrant abdominal pain starting early this morning at approximately 3 AM. Patient awoke from sleep with severe pain. She took Tylenol which gave her minimal relief. She had associated nausea and vomiting. No fever or diarrhea. No urinary symptoms. No vaginal bleeding or discharge. Patient called her on-call obstetrician who recommended that she be evaluated. She was evaluated at [facility] and they did not feel that this was related to her previous cesarean section. Patient was transferred to [facility] for further evaluation and workup of her right upper quadrant abdominal pain. Patient denies other abdominal surgeries in the past. She denies heavy NSAID or alcohol use. Pain does not radiate. Onset of symptoms acute. Course is waxing and waning.  Patient is a 30 y.o. female presenting with abdominal pain and vomiting. The history is provided by the patient and medical records.  Abdominal Pain Associated symptoms: nausea and vomiting   Associated symptoms: no chest pain, no cough, no diarrhea, no dysuria, no fever and no sore throat   Emesis Associated symptoms: abdominal pain   Associated symptoms: no diarrhea, no headaches, no myalgias and no sore throat     Past Medical History  Diagnosis Date  . Asthma   . Allergy   . Headache   . GERD (gastroesophageal reflux disease)     with pregnancy  . Anemia   . Depression   . PTSD (post-traumatic stress disorder)   . Dissociative identity disorder    Past Surgical History  Procedure Laterality Date   . Breast surgery  x 2    breast reduction and lumpectomy  . Cesarean section N/A 05/10/2014    Procedure: CESAREAN SECTION;  Surgeon: Freddrick March. Tenny Craw, MD;  Location: [facility];  Service: Obstetrics;  Laterality: N/A;  . Cesarean section N/A 06/05/2015    Procedure: CESAREAN SECTION;  Surgeon: Waynard Reeds, MD;  Location: [facility];  Service: Obstetrics;  Laterality: N/A;   Family History  Problem Relation Age of Onset  . Bipolar disorder Mother   . Cancer Father   . Sickle cell anemia Brother   . Breast cancer Maternal Grandmother   . Diabetes Maternal Grandmother   . Diabetes Maternal Grandfather   . Asthma Mother   . Asthma Sister   . Asthma      nephew  . COPD Maternal Aunt    Social History  Substance Use Topics  . Smoking status: Former Smoker -- 1.00 packs/day for 8 years    Types: Cigarettes    Quit date: 09/09/2013  . Smokeless tobacco: Never Used  . Alcohol Use: No   OB History    Gravida Para Term Preterm AB TAB SAB Ectopic Multiple Living   0 2     Review of Systems  Constitutional: Negative for fever.  HENT: Negative for rhinorrhea and sore throat.   Eyes: Negative for redness.  Respiratory: Negative for cough.   Cardiovascular: Negative for chest pain.  Gastrointestinal: Positive for nausea, vomiting and abdominal pain. Negative for diarrhea.  Genitourinary: Negative for dysuria.  Musculoskeletal: Negative  for myalgias.  Skin: Negative for rash.  Neurological: Negative for headaches.    Allergies  Nsaids and Soybean-containing drug products  Home Medications   Prior to Admission medications   Medication Sig Start Date End Date Taking? Authorizing Provider  albuterol (PROVENTIL HFA;VENTOLIN HFA) 108 (90 BASE) MCG/ACT inhaler Inhale 2 puffs into the lungs every 6 (six) hours as needed for wheezing.   Yes Historical Provider, MD  albuterol (PROVENTIL) (2.5 MG/3ML) 0.083% nebulizer solution Take 2.5 mg by nebulization every 6 (six) hours as needed  for wheezing or shortness of breath.   Yes Historical Provider, MD  budesonide-formoterol (SYMBICORT) 80-4.5 MCG/ACT inhaler Inhale 2 puffs into the lungs daily.   Yes Historical Provider, MD  cetirizine (ZYRTEC) 10 MG tablet Take 10 mg by mouth daily.   Yes Historical Provider, MD  ferrous sulfate 325 (65 FE) MG tablet Take 325 mg by mouth daily with breakfast.   Yes Historical Provider, MD  omeprazole (PRILOSEC) 10 MG capsule Take 10 mg by mouth daily.   Yes Historical Provider, MD  Prenatal Vit-Fe Fumarate-FA (PRENATAL MULTIVITAMIN) TABS tablet Take 1 tablet by mouth daily at 12 noon.   Yes Historical Provider, MD  sertraline (ZOLOFT) 25 MG tablet Take 1 pill daily for 7 days then increase to 2 pills daily Patient taking differently: Take 25 mg by mouth daily. Take 1 pill daily for 7 days then increase to 2 pills daily 06/07/15  Yes Waynard Reeds, MD  docusate sodium (COLACE) 100 MG capsule Take 1 capsule (100 mg total) by mouth 2 (two) times daily. Patient not taking: Reported on 06/28/2015 06/07/15   Waynard Reeds, MD  oxyCODONE-acetaminophen (ROXICET) 5-325 MG tablet Take 1-2 tablets by mouth every 4 (four) hours as needed for severe pain. Patient not taking: Reported on 06/28/2015 06/07/15   Waynard Reeds, MD   BP 108/70 mmHg  Pulse 69  Temp(Src) 97.7 F (36.5 C) (Oral)  Resp 16  Ht  (1.626 m)  Wt 140.615 kg  BMI 53.19 kg/m2  SpO2 99%   Physical Exam  Constitutional: She appears well-developed and well-nourished.  HENT:  Head: Normocephalic and atraumatic.  Eyes: Conjunctivae are normal. Right eye exhibits no discharge. Left eye exhibits no discharge.  Neck: Normal range of motion. Neck supple.  Cardiovascular: Normal rate, regular rhythm and normal heart sounds.   Pulmonary/Chest: Effort normal and breath sounds normal.  Abdominal: Soft. There is tenderness (moderate) in the right upper quadrant and epigastric area. There is no rigidity, no rebound, no guarding and no tenderness at  McBurney's point.  Neurological: She is alert.  Skin: Skin is warm and dry.  Psychiatric: She has a normal mood and affect.  Nursing note and vitals reviewed.   ED Course  Procedures (including critical care time) Labs Review Labs Reviewed  URINALYSIS, ROUTINE W REFLEX MICROSCOPIC (NOT AT [facility]) - Abnormal; Notable for the following:    Hgb urine dipstick TRACE (*)    All other components within normal limits  URINE MICROSCOPIC-ADD ON - Abnormal; Notable for the following:    Squamous Epithelial / LPF 0-5 (*)    Bacteria, UA FEW (*)    All other components within normal limits  CBC WITH DIFFERENTIAL/PLATELET - Abnormal; Notable for the following:    Hemoglobin 11.8 (*)    All other components within normal limits  COMPREHENSIVE METABOLIC PANEL - Abnormal; Notable for the following:    Albumin 3.1 (*)    AST 325 (*)    ALT 191 (*)  Alkaline Phosphatase 150 (*)    All other components within normal limits  LIPASE, BLOOD - Abnormal; Notable for the following:    Lipase 257 (*)    All other components within normal limits  POCT PREGNANCY, URINE    Imaging Review US Abdomen Complete  06/28/2015  CLINICAL DATA:  Right upper quadrant abdominal pain, acute onset this morning. 2 weeks postpartum from C-section delivery. EXAM: ABDOMEN ULTRASOUND COMPLETE COMPARISON:  07/13/2013 FINDINGS: Gallbladder: Numerous small less than 1 cm gallstones are seen. No evidence of gallbladder wall thickening or pericholecystic fluid. No sonographic Murphy sign noted by sonographer. Common bile duct: Diameter: 6 mm, within normal limits. Liver: No focal lesion identified. Within normal limits in parenchymal echogenicity. IVC: No abnormality visualized. Pancreas: Visualized portion unremarkable. Spleen: Size and appearance within normal limits. Right Kidney: Length: 13.0 cm. Echogenicity within normal limits. No mass or hydronephrosis visualized. Left Kidney: Length: 11.4 cm. Echogenicity within normal  limits. No mass or hydronephrosis visualized. Abdominal aorta: No aneurysm visualized. Other findings: None. IMPRESSION: Cholelithiasis. No sonographic signs of acute cholecystitis, biliary dilatation, or other acute findings. No evidence of hydronephrosis. Electronically Signed   By: Myles Rosenthal M.D.   On: 06/28/2015 15:28   I have personally reviewed and evaluated these images and lab results as part of my medical decision-making.   EKG Interpretation None       1:27 PM Patient seen and examined. Work-up initiated. Symptoms are currently controlled.  Vital signs reviewed and are as follows: BP 108/70 mmHg  Pulse 69  Temp(Src) 97.7 F (36.5 C) (Oral)  Resp 16  Ht  (1.626 m)  Wt 140.615 kg  BMI 53.19 kg/m2  SpO2 99%  3:57 PM Pain returned to 7/10. Additional medicine ordered. Patient updated on results.   Discussed with Dr. Donnald Garre. Consult Dr. Lindie Spruce who will see patient. Handoff to [employer] who will follow-up on reccs.   MDM   Final diagnoses:  Abdominal pain, right upper quadrant   Patient with cholelithiasis with elevated lipase/transaminases.    Renne Crigler, PA-C 06/28/15 1600  Arby Barrette, MD 07/06/15 [phone]

## 2015-06-28 NOTE — MAU Provider Note (Signed)
History     CSN: [id number]  Arrival date and time: 06/28/15 1000   First Provider Initiated Contact with Patient 06/28/15 1048      Chief Complaint  Patient presents with  . Abdominal Pain  . Nausea  . Emesis   HPI   Ms.Shannon Aguirre is a 30 y.o. female [phone] with a history of GERD , she is status post repeat cesarean section on 1/5 by Dr. Harrington Challenger. Presents to MAU today with complaints of upper abdominal pain. The pain starts in the middle of her abdomen and radiates to the right side of her upper abdomen. The pain worsened at 0300 this morning, however started 2 days ago.   She took tylenol last night and it helped some. This morning She spoke to talk to Dr. Ouida Sills and he recommended she take another dose of tylenol and if this does not improve the pain to come directly to [facility] hospital.   She does have a history of GERD and it currently being treated for this.  She has continued to take Prilosec for her GERD, and tried tums which did not help.   She does not eat a lot fast foods or fried foods.  She ate spaghetti last night for dinner around 7:00 pm .    OB History    Gravida Para Term Preterm AB TAB SAB Ectopic Multiple Living   _0 0 2      Past Medical History  Diagnosis Date  . Asthma   . Allergy   . Headache   . GERD (gastroesophageal reflux disease)     with pregnancy  . Anemia   . Depression   . PTSD (post-traumatic stress disorder)   . Dissociative identity disorder     Past Surgical History  Procedure Laterality Date  . Breast surgery  x 2    breast reduction and lumpectomy  . Cesarean section N/A 05/10/2014    Procedure: CESAREAN SECTION;  Surgeon: Farrel Gobble. Harrington Challenger, MD;  Location: [place];  Service: Obstetrics;  Laterality: N/A;  . Cesarean section N/A 06/05/2015    Procedure: CESAREAN SECTION;  Surgeon: Vanessa Kick, MD;  Location: [place] [facility];  Service: Obstetrics;  Laterality: N/A;    Family History  Problem Relation Age of Onset  . Bipolar  disorder Mother   . Cancer Father   . Sickle cell anemia Brother   . Breast cancer Maternal Grandmother   . Diabetes Maternal Grandmother   . Diabetes Maternal Grandfather   . Asthma Mother   . Asthma Sister   . Asthma      nephew  . COPD Maternal Aunt     Social History  Substance Use Topics  . Smoking status: Former Smoker -- 1.00 packs/day for 8 years    Types: Cigarettes    Quit date: 09/09/2013  . Smokeless tobacco: Never Used  . Alcohol Use: No    Allergies:  Allergies  Allergen Reactions  . Nsaids Anaphylaxis  . Soybean-Containing Drug Products Swelling    Swelling of throat Swelling of lips    Prescriptions prior to admission  Medication Sig Dispense Refill Last Dose  . albuterol (PROVENTIL HFA;VENTOLIN HFA) 108 (90 BASE) MCG/ACT inhaler Inhale 2 puffs into the lungs every 6 (six) hours as needed for wheezing.   06/27/2015 at Unknown time  . albuterol (PROVENTIL) (2.5 MG/3ML) 0.083% nebulizer solution Take 2.5 mg by nebulization every 6 (six) hours as needed for wheezing or shortness of breath.  06/27/2015 at Unknown time  . budesonide-formoterol (SYMBICORT) 80-4.5 MCG/ACT inhaler Inhale 2 puffs into the lungs daily.   06/27/2015 at Unknown time  . cetirizine (ZYRTEC) 10 MG tablet Take 10 mg by mouth daily.   06/27/2015 at Unknown time  . ferrous sulfate 325 (65 FE) MG tablet Take 325 mg by mouth daily with breakfast.   06/27/2015 at Unknown time  . omeprazole (PRILOSEC) 10 MG capsule Take 10 mg by mouth daily.   06/27/2015 at Unknown time  . Prenatal Vit-Fe Fumarate-FA (PRENATAL MULTIVITAMIN) TABS tablet Take 1 tablet by mouth daily at 12 noon.   06/27/2015 at Unknown time  . sertraline (ZOLOFT) 25 MG tablet Take 1 pill daily for 7 days then increase to 2 pills daily (Patient taking differently: Take 25 mg by mouth daily. Take 1 pill daily for 7 days then increase to 2 pills daily) 49 tablet 6 06/27/2015 at Unknown time  . docusate sodium (COLACE) 100 MG capsule Take 1  capsule (100 mg total) by mouth 2 (two) times daily. (Patient not taking: Reported on 06/28/2015) 60 capsule 0   . oxyCODONE-acetaminophen (ROXICET) 5-325 MG tablet Take 1-2 tablets by mouth every 4 (four) hours as needed for severe pain. (Patient not taking: Reported on 06/28/2015) 46 tablet 0    Results for orders placed or performed during the hospital encounter of 06/28/15 (from the past 48 hour(s))  Urinalysis, Routine w reflex microscopic (not at [facility])     Status: Abnormal   Collection Time: 06/28/15 10:20 AM  Result Value Ref Range   Color, Urine YELLOW YELLOW   APPearance CLEAR CLEAR   Specific Gravity, Urine 1.015 1.005 - 1.030   pH 7.0 5.0 - 8.0   Glucose, UA NEGATIVE NEGATIVE mg/dL   Hgb urine dipstick TRACE (A) NEGATIVE   Bilirubin Urine NEGATIVE NEGATIVE   Ketones, ur NEGATIVE NEGATIVE mg/dL   Protein, ur NEGATIVE NEGATIVE mg/dL   Nitrite NEGATIVE NEGATIVE   Leukocytes, UA NEGATIVE NEGATIVE  Urine microscopic-add on     Status: Abnormal   Collection Time: 06/28/15 10:20 AM  Result Value Ref Range   Squamous Epithelial / LPF 0-5 (A) NONE SEEN   WBC, UA 0-5 0 - 5 WBC/hpf   RBC / HPF 0-5 0 - 5 RBC/hpf   Bacteria, UA FEW (A) NONE SEEN  Pregnancy, urine POC     Status: None   Collection Time: 06/28/15 10:23 AM  Result Value Ref Range   Preg Test, Ur NEGATIVE NEGATIVE    Comment:        THE SENSITIVITY OF THIS METHODOLOGY IS >24 mIU/mL   CBC with Differential/Platelet     Status: Abnormal   Collection Time: 06/28/15  1:15 PM  Result Value Ref Range   WBC 7.4 4.0 - 10.5 K/uL   RBC 4.49 3.87 - 5.11 MIL/uL   Hemoglobin 11.8 (L) 12.0 - 15.0 g/dL   HCT 37.6 36.0 - 46.0 %   MCV 83.7 78.0 - 100.0 fL   MCH 26.3 26.0 - 34.0 pg   MCHC 31.4 30.0 - 36.0 g/dL   RDW 13.7 11.5 - 15.5 %   Platelets 400 150 - 400 K/uL   Neutrophils Relative % 67 %   Neutro Abs 4.9 1.7 - 7.7 K/uL   Lymphocytes Relative 24 %   Lymphs Abs 1.8 0.7 - 4.0 K/uL   Monocytes Relative 8 %   Monocytes  Absolute 0.6 0.1 - 1.0 K/uL   Eosinophils Relative 1 %   Eosinophils Absolute  0.1 0.0 - 0.7 K/uL   Basophils Relative 0 %   Basophils Absolute 0.0 0.0 - 0.1 K/uL  Comprehensive metabolic panel     Status: Abnormal   Collection Time: 06/28/15  1:15 PM  Result Value Ref Range   Sodium 139 135 - 145 mmol/L   Potassium 4.1 3.5 - 5.1 mmol/L   Chloride 107 101 - 111 mmol/L   CO2 23 22 - 32 mmol/L   Glucose, Bld 95 65 - 99 mg/dL   BUN 7 6 - 20 mg/dL   Creatinine, Ser 0.67 0.44 - 1.00 mg/dL   Calcium 9.1 8.9 - 10.3 mg/dL   Total Protein 7.2 6.5 - 8.1 g/dL   Albumin 3.1 (L) 3.5 - 5.0 g/dL   AST 325 (H) 15 - 41 U/L   ALT 191 (H) 14 - 54 U/L   Alkaline Phosphatase 150 (H) 38 - 126 U/L   Total Bilirubin 1.0 0.3 - 1.2 mg/dL   GFR calc non Af Amer >60 >60 mL/min   GFR calc Af Amer >60 >60 mL/min    Comment: (NOTE) The eGFR has been calculated using the CKD EPI equation. This calculation has not been validated in all clinical situations. eGFR's persistently <60 mL/min signify possible Chronic Kidney Disease.    Anion gap 9 5 - 15  Lipase, blood     Status: Abnormal   Collection Time: 06/28/15  1:15 PM  Result Value Ref Range   Lipase 257 (H) 11 - 51 U/L    Review of Systems  Constitutional: Negative for fever.  Respiratory: Negative for shortness of breath.   Cardiovascular: Negative for chest pain.  Gastrointestinal: Positive for nausea, vomiting and abdominal pain. Negative for heartburn, diarrhea and constipation.   Physical Exam   Blood pressure 107/67, pulse 68, temperature 97.7 F (36.5 C), resp. rate 18, unknown if currently breastfeeding.  Physical Exam  Constitutional: She appears well-developed and well-nourished.  Non-toxic appearance. She does not have a sickly appearance. She does not appear ill. No distress.  GI: Soft. Normal appearance. There is tenderness in the right upper quadrant. There is rigidity, guarding and positive Murphy's sign.  Skin: She is not  diaphoretic.    MAU Course  Procedures  None  MDM  Pregnancy test negative.  Non GYN issue, Dr. Ouida Sills recommends transfer to [facility] at @ 11:00 Dr. Rogene Houston notified of patient transfer and accepts the patient for transfer to [facility] ED via carelink.  IM dilaudid given for pain.   Assessment and Plan   A:    ICD-9-CM ICD-10-CM   1. Abdominal pain, right upper quadrant 789.01 R10.11    P:  Transfer to [facility] ED for further evaluation and assessment.   Lezlie Lye, NP 06/28/2015 7:21 PM

## 2015-06-28 NOTE — MAU Note (Addendum)
Upper abdominal pain with nausea and vomiting since 3 am. Had C/S on 06/05/15 called on doctor at 5:00 am took Tylenol x 3 did not help. Vomited x 1

## 2015-06-29 ENCOUNTER — Inpatient Hospital Stay (HOSPITAL_COMMUNITY): Payer: Medicare Other

## 2015-06-29 ENCOUNTER — Inpatient Hospital Stay (HOSPITAL_COMMUNITY): Payer: Medicare Other | Admitting: Certified Registered"

## 2015-06-29 ENCOUNTER — Encounter (HOSPITAL_COMMUNITY): Payer: Self-pay | Admitting: Anesthesiology

## 2015-06-29 ENCOUNTER — Encounter (HOSPITAL_COMMUNITY): Admission: AD | Disposition: A | Discharge status: Home / Self Care | Payer: Self-pay | Source: Ambulatory Visit

## 2015-06-29 HISTORY — PX: CHOLECYSTECTOMY: SHX55

## 2015-06-29 LAB — COMPREHENSIVE METABOLIC PANEL
ALBUMIN: 2.8 g/dL — AB (ref 3.5–5.0)
ALK PHOS: 160 U/L — AB (ref 38–126)
ALT: 214 U/L — ABNORMAL HIGH (ref 14–54)
ANION GAP: 9 (ref 5–15)
AST: 165 U/L — ABNORMAL HIGH (ref 15–41)
BUN: <5 mg/dL — ABNORMAL LOW (ref 6–20)
CALCIUM: 8.8 mg/dL — AB (ref 8.9–10.3)
CO2: 28 mmol/L (ref 22–32)
Chloride: 104 mmol/L (ref 101–111)
Creatinine, Ser: 0.77 mg/dL (ref 0.44–1.00)
GFR calc non Af Amer: >60 mL/min (ref >60)
Glucose, Bld: 92 mg/dL (ref 65–99)
POTASSIUM: 4.2 mmol/L (ref 3.5–5.1)
SODIUM: 141 mmol/L (ref 135–145)
Total Bilirubin: 0.6 mg/dL (ref 0.3–1.2)
Total Protein: 6.4 g/dL — ABNORMAL LOW (ref 6.5–8.1)

## 2015-06-29 LAB — CBC
HEMATOCRIT: 36.8 % (ref 36.0–46.0)
HEMOGLOBIN: 11.4 g/dL — AB (ref 12.0–15.0)
MCH: 26.4 pg (ref 26.0–34.0)
MCHC: 31 g/dL (ref 30.0–36.0)
MCV: 85.2 fL (ref 78.0–100.0)
Platelets: 372 10*3/uL (ref 150–400)
RBC: 4.32 MIL/uL (ref 3.87–5.11)
RDW: 13.9 % (ref 11.5–15.5)
WBC: 5.6 10*3/uL (ref 4.0–10.5)

## 2015-06-29 LAB — LIPASE, BLOOD: LIPASE: 37 U/L (ref 11–51)

## 2015-06-29 SURGERY — LAPAROSCOPIC CHOLECYSTECTOMY WITH INTRAOPERATIVE CHOLANGIOGRAM
Anesthesia: General | Site: Abdomen

## 2015-06-29 MED ORDER — ROCURONIUM BROMIDE 100 MG/10ML IV SOLN
INTRAVENOUS | Status: DC | PRN
  Administered 2015-06-29: 20 mg via INTRAVENOUS
  Administered 2015-06-29: 5 mg via INTRAVENOUS

## 2015-06-29 MED ORDER — MIDAZOLAM HCL 2 MG/2ML IJ SOLN
INTRAMUSCULAR | Status: AC
Start: 2015-06-29 — End: 2015-06-29
  Filled 2015-06-29: qty 2

## 2015-06-29 MED ORDER — EPHEDRINE SULFATE 50 MG/ML IJ SOLN
INTRAMUSCULAR | Status: AC
  Filled 2015-06-29: qty 2

## 2015-06-29 MED ORDER — HYDROMORPHONE HCL 1 MG/ML IJ SOLN
0.5000 mg | INTRAMUSCULAR | Status: DC | PRN
  Administered 2015-06-29: 1 mg via INTRAVENOUS
  Administered 2015-06-29 – 2015-06-30 (×3): 2 mg via INTRAVENOUS
  Administered 2015-06-30 (×2): 1 mg via INTRAVENOUS
  Filled 2015-06-29 (×2): qty 1
  Filled 2015-06-29: qty 2
  Filled 2015-06-29: qty 1
  Filled 2015-06-29: qty 2
  Filled 2015-06-29: qty 1
  Filled 2015-06-29: qty 2

## 2015-06-29 MED ORDER — ARTIFICIAL TEARS OP OINT
TOPICAL_OINTMENT | OPHTHALMIC | Status: AC
  Filled 2015-06-29: qty 3.5

## 2015-06-29 MED ORDER — SUGAMMADEX SODIUM 200 MG/2ML IV SOLN
INTRAVENOUS | Status: DC | PRN
  Administered 2015-06-29: 282.6 mg via INTRAVENOUS

## 2015-06-29 MED ORDER — ETOMIDATE 2 MG/ML IV SOLN
INTRAVENOUS | Status: DC | PRN
  Administered 2015-06-29: 16 mg via INTRAVENOUS

## 2015-06-29 MED ORDER — MIDAZOLAM HCL 5 MG/5ML IJ SOLN
INTRAMUSCULAR | Status: DC | PRN
  Administered 2015-06-29: 2 mg via INTRAVENOUS

## 2015-06-29 MED ORDER — HYDROMORPHONE HCL 1 MG/ML IJ SOLN
0.2500 mg | INTRAMUSCULAR | Status: DC | PRN
  Administered 2015-06-29 (×2): 0.5 mg via INTRAVENOUS

## 2015-06-29 MED ORDER — 0.9 % SODIUM CHLORIDE (POUR BTL) OPTIME
TOPICAL | Status: DC | PRN
  Administered 2015-06-29: 1000 mL

## 2015-06-29 MED ORDER — FENTANYL CITRATE (PF) 250 MCG/5ML IJ SOLN
INTRAMUSCULAR | Status: AC
  Filled 2015-06-29: qty 5

## 2015-06-29 MED ORDER — LIDOCAINE HCL (CARDIAC) 20 MG/ML IV SOLN
INTRAVENOUS | Status: AC
  Filled 2015-06-29: qty 5

## 2015-06-29 MED ORDER — LIDOCAINE HCL (CARDIAC) 20 MG/ML IV SOLN
INTRAVENOUS | Status: DC | PRN
  Administered 2015-06-29: 50 mg via INTRAVENOUS

## 2015-06-29 MED ORDER — HYDROMORPHONE HCL 1 MG/ML IJ SOLN
INTRAMUSCULAR | Status: AC
  Administered 2015-06-29: 0.5 mg via INTRAVENOUS
  Filled 2015-06-29: qty 1

## 2015-06-29 MED ORDER — SUCCINYLCHOLINE CHLORIDE 20 MG/ML IJ SOLN
INTRAMUSCULAR | Status: DC | PRN
  Administered 2015-06-29: 120 mg via INTRAVENOUS

## 2015-06-29 MED ORDER — ROCURONIUM BROMIDE 50 MG/5ML IV SOLN
INTRAVENOUS | Status: AC
  Filled 2015-06-29: qty 1

## 2015-06-29 MED ORDER — LACTATED RINGERS IV SOLN
INTRAVENOUS | Status: DC | PRN
  Administered 2015-06-29 (×3): via INTRAVENOUS

## 2015-06-29 MED ORDER — GLYCOPYRROLATE 0.2 MG/ML IJ SOLN
INTRAMUSCULAR | Status: AC
  Filled 2015-06-29: qty 1

## 2015-06-29 MED ORDER — ATROPINE SULFATE 0.1 MG/ML IJ SOLN
INTRAMUSCULAR | Status: AC
  Filled 2015-06-29: qty 10

## 2015-06-29 MED ORDER — SUCCINYLCHOLINE CHLORIDE 20 MG/ML IJ SOLN
INTRAMUSCULAR | Status: AC
  Filled 2015-06-29: qty 1

## 2015-06-29 MED ORDER — ONDANSETRON HCL 4 MG/2ML IJ SOLN
INTRAMUSCULAR | Status: AC
  Filled 2015-06-29: qty 2

## 2015-06-29 MED ORDER — SODIUM CHLORIDE 0.9 % IR SOLN
Status: DC | PRN
  Administered 2015-06-29: 1000 mL

## 2015-06-29 MED ORDER — BUPIVACAINE-EPINEPHRINE 0.25% -1:200000 IJ SOLN
INTRAMUSCULAR | Status: DC | PRN
  Administered 2015-06-29: 8 mL

## 2015-06-29 MED ORDER — METHOCARBAMOL 750 MG PO TABS: 750.0000 mg | ORAL_TABLET | Freq: Three times a day (TID) | ORAL | Status: DC | PRN

## 2015-06-29 MED ORDER — PROMETHAZINE HCL 25 MG/ML IJ SOLN: 6.2500 mg | INTRAMUSCULAR | Status: DC | PRN

## 2015-06-29 MED ORDER — SUGAMMADEX SODIUM 500 MG/5ML IV SOLN
INTRAVENOUS | Status: AC
  Filled 2015-06-29: qty 5

## 2015-06-29 MED ORDER — SODIUM CHLORIDE 0.9 % IV SOLN
INTRAVENOUS | Status: DC | PRN
  Administered 2015-06-29: 5 mL

## 2015-06-29 MED ORDER — BUPIVACAINE-EPINEPHRINE (PF) 0.25% -1:200000 IJ SOLN
INTRAMUSCULAR | Status: AC
  Filled 2015-06-29: qty 30

## 2015-06-29 MED ORDER — FENTANYL CITRATE (PF) 100 MCG/2ML IJ SOLN
INTRAMUSCULAR | Status: DC | PRN
  Administered 2015-06-29: 100 ug via INTRAVENOUS
  Administered 2015-06-29 (×2): 50 ug via INTRAVENOUS
  Administered 2015-06-29: 100 ug via INTRAVENOUS

## 2015-06-29 MED ORDER — PROMETHAZINE HCL 25 MG/ML IJ SOLN
12.5000 mg | Freq: Four times a day (QID) | INTRAMUSCULAR | Status: DC | PRN
  Administered 2015-06-29: 12.5 mg via INTRAVENOUS
  Filled 2015-06-29: qty 1

## 2015-06-29 SURGICAL SUPPLY — 45 items
APPLIER CLIP 5 13 M/L LIGAMAX5 (MISCELLANEOUS) ×3
BANDAGE ADH SHEER 1  50/CT (GAUZE/BANDAGES/DRESSINGS) ×9 IMPLANT
BENZOIN TINCTURE PRP APPL 2/3 (GAUZE/BANDAGES/DRESSINGS) ×3 IMPLANT
BLADE SURG ROTATE 9660 (MISCELLANEOUS) IMPLANT
CANISTER SUCTION 2500CC (MISCELLANEOUS) ×3 IMPLANT
CHLORAPREP W/TINT 26ML (MISCELLANEOUS) ×3 IMPLANT
CLIP APPLIE 5 13 M/L LIGAMAX5 (MISCELLANEOUS) ×1 IMPLANT
CLOSURE WOUND 1/2 X4 (GAUZE/BANDAGES/DRESSINGS) ×1
COVER MAYO STAND STRL (DRAPES) ×3 IMPLANT
COVER SURGICAL LIGHT HANDLE (MISCELLANEOUS) ×3 IMPLANT
DEVICE TROCAR PUNCTURE CLOSURE (ENDOMECHANICALS) ×3 IMPLANT
DRAPE C-ARM 42X72 X-RAY (DRAPES) ×3 IMPLANT
DRSG OPSITE POSTOP 3X4 (GAUZE/BANDAGES/DRESSINGS) ×3 IMPLANT
DRSG TEGADERM 4X4.75 (GAUZE/BANDAGES/DRESSINGS) ×3 IMPLANT
ELECT REM PT RETURN 9FT ADLT (ELECTROSURGICAL) ×3
ELECTRODE REM PT RTRN 9FT ADLT (ELECTROSURGICAL) ×1 IMPLANT
GAUZE SPONGE 2X2 8PLY STRL LF (GAUZE/BANDAGES/DRESSINGS) ×1 IMPLANT
GLOVE BIOGEL M STRL SZ7.5 (GLOVE) ×3 IMPLANT
GLOVE BIOGEL PI IND STRL 8 (GLOVE) ×1 IMPLANT
GLOVE BIOGEL PI INDICATOR 8 (GLOVE) ×2
GOWN STRL REUS W/ TWL LRG LVL3 (GOWN DISPOSABLE) ×3 IMPLANT
GOWN STRL REUS W/ TWL XL LVL3 (GOWN DISPOSABLE) ×1 IMPLANT
GOWN STRL REUS W/TWL LRG LVL3 (GOWN DISPOSABLE) ×6
GOWN STRL REUS W/TWL XL LVL3 (GOWN DISPOSABLE) ×2
KIT BASIN OR (CUSTOM PROCEDURE TRAY) ×3 IMPLANT
KIT ROOM TURNOVER OR (KITS) ×3 IMPLANT
NS IRRIG 1000ML POUR BTL (IV SOLUTION) ×3 IMPLANT
PAD ARMBOARD 7.5X6 YLW CONV (MISCELLANEOUS) ×3 IMPLANT
POUCH RETRIEVAL ECOSAC 10 (ENDOMECHANICALS) ×1 IMPLANT
POUCH RETRIEVAL ECOSAC 10MM (ENDOMECHANICALS) ×2
SCISSORS LAP 5X35 DISP (ENDOMECHANICALS) ×3 IMPLANT
SET CHOLANGIOGRAPH 5 50 .035 (SET/KITS/TRAYS/PACK) ×3 IMPLANT
SET IRRIG TUBING LAPAROSCOPIC (IRRIGATION / IRRIGATOR) ×3 IMPLANT
SLEEVE ENDOPATH XCEL 5M (ENDOMECHANICALS) ×6 IMPLANT
SPECIMEN JAR SMALL (MISCELLANEOUS) ×3 IMPLANT
SPONGE GAUZE 2X2 STER 10/PKG (GAUZE/BANDAGES/DRESSINGS) ×2
STRIP CLOSURE SKIN 1/2X4 (GAUZE/BANDAGES/DRESSINGS) ×2 IMPLANT
SUT MNCRL AB 4-0 PS2 18 (SUTURE) ×3 IMPLANT
SUT VICRYL 0 UR6 27IN ABS (SUTURE) ×9 IMPLANT
TOWEL OR 17X24 6PK STRL BLUE (TOWEL DISPOSABLE) ×3 IMPLANT
TOWEL OR 17X26 10 PK STRL BLUE (TOWEL DISPOSABLE) ×3 IMPLANT
TRAY LAPAROSCOPIC MC (CUSTOM PROCEDURE TRAY) ×3 IMPLANT
TROCAR XCEL BLUNT TIP 100MML (ENDOMECHANICALS) ×3 IMPLANT
TROCAR XCEL NON-BLD 5MMX100MML (ENDOMECHANICALS) ×3 IMPLANT
TUBING INSUFFLATION (TUBING) ×3 IMPLANT

## 2015-06-29 NOTE — Anesthesia Preprocedure Evaluation (Addendum)
Anesthesia Evaluation  Patient identified by MRN, date of birth, ID band Patient awake    Reviewed: Allergy & Precautions, NPO status , Patient's Chart, lab work & pertinent test results  Airway Mallampati: II  TM Distance: >3 FB Neck ROM: Full    Dental   Pulmonary asthma , former smoker,    breath sounds clear to auscultation       Cardiovascular negative cardio ROS   Rhythm:Regular Rate:Normal     Neuro/Psych    GI/Hepatic Neg liver ROS, GERD  ,  Endo/Other  negative endocrine ROS  Renal/GU negative Renal ROS     Musculoskeletal   Abdominal   Peds  Hematology   Anesthesia Other Findings   Reproductive/Obstetrics                            Anesthesia Physical Anesthesia Plan  ASA: III  Anesthesia Plan: General   Post-op Pain Management:    Induction: Intravenous, Rapid sequence and Cricoid pressure planned  Airway Management Planned: Oral ETT  Additional Equipment:   Intra-op Plan:   Post-operative Plan: Extubation in OR  Informed Consent: I have reviewed the patients History and Physical, chart, labs and discussed the procedure including the risks, benefits and alternatives for the proposed anesthesia with the patient or authorized representative who has indicated his/her understanding and acceptance.   Dental advisory given  Plan Discussed with: CRNA and Anesthesiologist  Anesthesia Plan Comments:         Anesthesia Quick Evaluation

## 2015-06-29 NOTE — Progress Notes (Signed)
Subjective: Some nausea. No emesis; less pain overnight and this am compared to yesterday.   Objective: Vital signs in last 24 hours: Temp:  [97.2 F (36.2 C)-98.6 F (37 C)] 98.3 F (36.8 C) (01/29 0628) Pulse Rate:  [50-77] 59 (01/29 0628) Resp:  [14-18] 16 (01/29 0628) BP: (90-119)/(48-94) 91/52 mmHg (01/29 0628) SpO2:  [96 %-100 %] 99 % (01/29 0628) Weight:  [140.615 kg (310 lb)-141.33 kg (311 lb 9.2 oz)] 141.33 kg (311 lb 9.2 oz) (01/28 1838)    Intake/Output from previous day: 01/28 0701 - 01/29 0700 In: 1538.3 [P.O.:540; I.V.:998.3] Out: 3300 [Urine:3300] Intake/Output this shift:    Resting comfortably, nontoxic, not ill appearing cta b/l Reg Soft, nd, obese, some RUQ TTP; no rebound/guarding +scds, no edema  Lab Results:   Recent Labs  06/28/15 1315 06/29/15 0600  WBC 7.4 5.6  HGB 11.8* 11.4*  HCT 37.6 36.8  PLT 400 372   BMET  Recent Labs  06/28/15 1315 06/29/15 0600  NA 139 141  K 4.1 4.2  CL 107 104  CO2 23 28  GLUCOSE 95 92  BUN 7 <5*  CREATININE 0.67 0.77  CALCIUM 9.1 8.8*   Hepatic Function Latest Ref Rng 06/29/2015 06/28/2015 04/30/2015  Total Protein 6.5 - 8.1 g/dL 6.4(L) 7.2 7.2  Albumin 3.5 - 5.0 g/dL 2.8(L) 3.1(L) 2.9(L)  AST 15 - 41 U/L 165(H) 325(H) 21  ALT 14 - 54 U/L 214(H) 191(H) 19  Alk Phosphatase 38 - 126 U/L 160(H) 150(H) 112  Total Bilirubin 0.3 - 1.2 mg/dL 0.6 1.0 0.4     PT/INR No results for input(s): LABPROT, INR in the last 72 hours. ABG No results for input(s): PHART, HCO3 in the last 72 hours.  Invalid input(s): PCO2, PO2  Studies/Results: US Abdomen Complete  06/28/2015  CLINICAL DATA:  Right upper quadrant abdominal pain, acute onset this morning. 2 weeks postpartum from C-section delivery. EXAM: ABDOMEN ULTRASOUND COMPLETE COMPARISON:  07/13/2013 FINDINGS: Gallbladder: Numerous small less than 1 cm gallstones are seen. No evidence of gallbladder wall thickening or pericholecystic fluid. No sonographic  Murphy sign noted by sonographer. Common bile duct: Diameter: 6 mm, within normal limits. Liver: No focal lesion identified. Within normal limits in parenchymal echogenicity. IVC: No abnormality visualized. Pancreas: Visualized portion unremarkable. Spleen: Size and appearance within normal limits. Right Kidney: Length: 13.0 cm. Echogenicity within normal limits. No mass or hydronephrosis visualized. Left Kidney: Length: 11.4 cm. Echogenicity within normal limits. No mass or hydronephrosis visualized. Abdominal aorta: No aneurysm visualized. Other findings: None. IMPRESSION: Cholelithiasis. No sonographic signs of acute cholecystitis, biliary dilatation, or other acute findings. No evidence of hydronephrosis. Electronically Signed   By: Earle Gell M.D.   On: 06/28/2015 15:28    Anti-infectives: Anti-infectives    Start     Dose/Rate Route Frequency Ordered Stop   06/28/15 1745  cefoTEtan (CEFOTAN) 2 g in dextrose 5 % 50 mL IVPB     2 g 100 mL/hr over 30 Minutes Intravenous To ShortStay Procedural 06/28/15 1727 06/29/15 1745      Assessment/Plan: Biliary pancreatitis  Although LFTs are mixed,  She has no fever. Lipase normal. Less pain. tbili normal. U/s did not biliary dilation. I think we can proceed with lap chole with ioc today.  We discussed gallbladder disease.  I discussed laparoscopic cholecystectomy with IOC in detail.  The patient was shown diagrams detailing the procedure.  We discussed the risks and benefits of a laparoscopic cholecystectomy including, but not limited to bleeding, infection, injury  to surrounding structures such as the intestine or liver, bile leak, retained gallstones, need to convert to an open procedure, prolonged diarrhea, blood clots such as  DVT, common bile duct injury, anesthesia risks, and possible need for additional procedures (ERCP).  We discussed the typical post-operative recovery course. I explained that the likelihood of improvement of their symptoms is  good.  To OR later this am Cont NPO abx on call scds  Leighton Ruff. Redmond Pulling, MD, FACS General, Bariatric, & Minimally Invasive Surgery [facility], [place]    LOS: 1 day    Gayland Curry 06/29/2015

## 2015-06-29 NOTE — Op Note (Signed)
Shannon Aguirre [id number] May 30, 1986 06/29/2015  Laparoscopic Cholecystectomy with IOC Procedure Note  Indications: This patient presents with symptomatic gallbladder disease and will undergo laparoscopic cholecystectomy.  Pre-operative Diagnosis: biliary pancreatitis  Post-operative Diagnosis: Same  Surgeon: Atilano Ina   Assistants: none  Anesthesia: General endotracheal anesthesia  Procedure Details  The patient was seen again in the Holding Room. The risks, benefits, complications, treatment options, and expected outcomes were discussed with the patient. The possibilities of reaction to medication, pulmonary aspiration, perforation of viscus, bleeding, recurrent infection, finding a normal gallbladder, the need for additional procedures, failure to diagnose a condition, the possible need to convert to an open procedure, and creating a complication requiring transfusion or operation were discussed with the patient. The likelihood of improving the patient's symptoms with return to their baseline status is good.  The patient and/or family concurred with the proposed plan, giving informed consent. The site of surgery properly noted. The patient was taken to Operating Room, identified as Shannon Aguirre and the procedure verified as Laparoscopic Cholecystectomy with Intraoperative Cholangiogram. A Time Out was held and the above information confirmed. Antibiotic prophylaxis was administered.   Prior to the induction of general anesthesia, antibiotic prophylaxis was administered. General endotracheal anesthesia was then administered and tolerated well. After the induction, the abdomen was prepped with Chloraprep and draped in the sterile fashion. The patient was positioned in the supine position.  Local anesthetic agent was injected into the skin near the umbilicus and an incision made. We dissected down to the abdominal fascia with blunt dissection.  The fascia was incised vertically and we entered the  peritoneal cavity bluntly.  A pursestring suture of 0-Vicryl was placed around the fascial opening.  The Hasson cannula was inserted and secured with the stay suture.  Pneumoperitoneum was then created with CO2 and tolerated well without any adverse changes in the patient's vital signs. An 5-mm port was placed in the subxiphoid position.  Two 5-mm ports were placed in the right upper quadrant. All skin incisions were infiltrated with a local anesthetic agent before making the incision and placing the trocars.   We positioned the patient in reverse Trendelenburg, tilted slightly to the patient's left.  The gallbladder was identified, the fundus grasped and retracted cephalad. Adhesions were lysed bluntly and with the electrocautery where indicated, taking care not to injure any adjacent organs or viscus. The infundibulum was grasped and retracted laterally, exposing the peritoneum overlying the triangle of Calot. This was then divided and exposed in a blunt fashion. A critical view of the cystic duct and cystic artery was obtained.  The cystic duct was clearly identified and bluntly dissected circumferentially. The cystic duct was ligated with a clip distally.   An incision was made in the cystic duct and the [facility] cholangiogram catheter introduced. The catheter was secured using a clip. A cholangiogram was then obtained which showed good visualization of the distal and proximal biliary tree with no sign of filling defects or obstruction.  Contrast flowed easily into the duodenum. The catheter was then removed.   The cystic duct was then ligated with clips and divided. The cystic artery which had been identified & dissected free was ligated with clips and divided as well.   The gallbladder was dissected from the liver bed in retrograde fashion with the electrocautery. The gallbladder was removed and placed in an Ecco sac.  The gallbladder and Ecco sac were then removed through the umbilical port site. The liver  bed was irrigated and inspected.  Hemostasis was achieved with the electrocautery. Copious irrigation was utilized and was repeatedly aspirated until clear.  The pursestring suture was used to close the umbilical fascia.  There was an air leak. 2 additional interrupted 0 vicryl sutures were placed at the umbilical fascia using a endoclose with laparoscopic assistance.  We again inspected the right upper quadrant for hemostasis.  The umbilical closure was inspected and there was no air leak and nothing trapped within the closure. Pneumoperitoneum was released as we removed the trocars.  4-0 Monocryl was used to close the skin.   Benzoin, steri-strips, and clean dressings were applied. The patient was then extubated and brought to the recovery room in stable condition. Instrument, sponge, and needle counts were correct at closure and at the conclusion of the case.   Findings: Cholelithiasis; +cricital view; normal ioc. Some omentum stuck to lower abd wall at recent c/s scar  Estimated Blood Loss: Minimal         Drains: none         Specimens: Gallbladder           Complications: None; patient tolerated the procedure well.         Disposition: PACU - hemodynamically stable.         Condition: stable  Shannon Aguirre. Andrey Campanile, MD, FACS General, Bariatric, & Minimally Invasive Surgery [facility], [place]

## 2015-06-29 NOTE — Transfer of Care (Signed)
Immediate Anesthesia Transfer of Care Note  Patient: Shannon Aguirre  Procedure(s) Performed: Procedure(s): LAPAROSCOPIC CHOLECYSTECTOMY WITH INTRAOPERATIVE CHOLANGIOGRAM (N/A)  Patient Location: PACU  Anesthesia Type:General  Level of Consciousness: awake, alert  and oriented  Airway & Oxygen Therapy: Patient Spontanous Breathing and Patient connected to nasal cannula oxygen  Post-op Assessment: Report given to RN and Post -op Vital signs reviewed and stable  Post vital signs: Reviewed and stable  Last Vitals:  Filed Vitals:   06/29/15 0628 06/29/15 0847  BP: 91/52 116/67  Pulse: 59 62  Temp: 36.8 C 36.6 C  Resp: 16 16    Complications: No apparent anesthesia complications

## 2015-06-29 NOTE — Anesthesia Postprocedure Evaluation (Signed)
Anesthesia Post Note  Patient: Shannon Aguirre  Procedure(s) Performed: Procedure(s) (LRB): LAPAROSCOPIC CHOLECYSTECTOMY WITH INTRAOPERATIVE CHOLANGIOGRAM (N/A)  Patient location during evaluation: PACU Anesthesia Type: General Level of consciousness: awake Pain management: pain level controlled Vital Signs Assessment: post-procedure vital signs reviewed and stable Respiratory status: spontaneous breathing Cardiovascular status: stable Anesthetic complications: no    Last Vitals:  Filed Vitals:   06/29/15 1211 06/29/15 1215  BP: 143/88   Pulse: 67 86  Temp:    Resp: 20 16    Last Pain:  Filed Vitals:   06/29/15 1221  PainSc: 8                  EDWARDS,Emersyn Wyss

## 2015-06-29 NOTE — Progress Notes (Signed)
Utilization review completed.  

## 2015-06-29 NOTE — Anesthesia Procedure Notes (Signed)
Date/Time: 06/29/2015 10:18 AM Performed by: Eligha Bridegroom Pre-anesthesia Checklist: Patient identified, Timeout performed, Emergency Drugs available, Suction available and Patient being monitored Oxygen Delivery Method: Circle system utilized Preoxygenation: Pre-oxygenation with 100% oxygen Intubation Type: IV induction, Rapid sequence and Cricoid Pressure applied Laryngoscope Size: Mac and 4 Tube size: 7.0 mm Number of attempts: 1 Airway Equipment and Method: Stylet and LTA kit utilized Placement Confirmation: ETT inserted through vocal cords under direct vision,  breath sounds checked- equal and bilateral and positive ETCO2 Secured at: 22 cm Tube secured with: Tape Dental Injury: Teeth and Oropharynx as per pre-operative assessment

## 2015-06-30 ENCOUNTER — Encounter (HOSPITAL_COMMUNITY): Payer: Self-pay | Admitting: General Surgery

## 2015-06-30 LAB — COMPREHENSIVE METABOLIC PANEL
ALT: 141 U/L — ABNORMAL HIGH (ref 14–54)
ANION GAP: 10 (ref 5–15)
AST: 61 U/L — ABNORMAL HIGH (ref 15–41)
Albumin: 3.1 g/dL — ABNORMAL LOW (ref 3.5–5.0)
Alkaline Phosphatase: 150 U/L — ABNORMAL HIGH (ref 38–126)
BUN: <5 mg/dL — ABNORMAL LOW (ref 6–20)
CHLORIDE: 102 mmol/L (ref 101–111)
CO2: 26 mmol/L (ref 22–32)
CREATININE: 0.79 mg/dL (ref 0.44–1.00)
Calcium: 8.9 mg/dL (ref 8.9–10.3)
Glucose, Bld: 99 mg/dL (ref 65–99)
POTASSIUM: 3.9 mmol/L (ref 3.5–5.1)
Sodium: 138 mmol/L (ref 135–145)
Total Bilirubin: 0.2 mg/dL — ABNORMAL LOW (ref 0.3–1.2)
Total Protein: 6.8 g/dL (ref 6.5–8.1)

## 2015-06-30 LAB — CBC
HCT: 36 % (ref 36.0–46.0)
Hemoglobin: 11.4 g/dL — ABNORMAL LOW (ref 12.0–15.0)
MCH: 26.8 pg (ref 26.0–34.0)
MCHC: 31.7 g/dL (ref 30.0–36.0)
MCV: 84.7 fL (ref 78.0–100.0)
PLATELETS: 386 10*3/uL (ref 150–400)
RBC: 4.25 MIL/uL (ref 3.87–5.11)
RDW: 14.2 % (ref 11.5–15.5)
WBC: 9 10*3/uL (ref 4.0–10.5)

## 2015-06-30 MED ORDER — ACETAMINOPHEN 325 MG PO TABS: 650.0000 mg | ORAL_TABLET | Freq: Four times a day (QID) | ORAL | Status: DC | PRN

## 2015-06-30 MED ORDER — OXYCODONE-ACETAMINOPHEN 5-325 MG PO TABS: 1.0000 | ORAL_TABLET | ORAL | Status: DC | PRN

## 2015-06-30 NOTE — Progress Notes (Signed)
Lanae Boast to be D/C'd home per MD order. Discussed with the patient and all questions fully answered.  VSS, Surgical sites clean, dry, intact with dressings in place.  IV catheter discontinued intact. Site without signs and symptoms of complications. Dressing and pressure applied.  An After Visit Summary was printed and given to the patient. Patient received prescription.  D/c education completed with patient/family including follow up instructions, medication list, d/c activities limitations if indicated, with other d/c instructions as indicated by MD - patient able to verbalize understanding, all questions fully answered.   Patient instructed to return to ED, call 911, or call MD for any changes in condition.   Patient to be escorted via WC, and D/C home via private auto.

## 2015-06-30 NOTE — Progress Notes (Signed)
1 Day Post-Op  Subjective: She looks good, she has had some trouble swallowing stuff, but tolerating PO's she is getting down.  She is feeding the baby now.  Has an allergy to NSAIDS.    Objective: Vital signs in last 24 hours: Temp:  [97.7 F (36.5 C)-99.7 F (37.6 C)] 99.7 F (37.6 C) (01/30 0612) Pulse Rate:  [59-98] 83 (01/30 0612) Resp:  [15-20] 16 (01/30 0612) BP: (116-149)/(65-88) 133/65 mmHg (01/30 0612) SpO2:  [91 %-100 %] 91 % (01/30 0745) Last BM Date: 06/28/15 1220 PO recorded  Heart healthy diet 3250 urine recorded Afebrile, VSS WBC is normal and LFT's improving IOC:  Normal intraoperative cholangiogram.  Intake/Output from previous day: 01/29 0701 - 01/30 0700 In: 4905 [P.O.:1220; I.V.:3685] Out: 3250 [Urine:3250] Intake/Output this shift:    General appearance: alert, cooperative and no distress GI: soft, sore, sites all look good  Lab Results:   Recent Labs  06/28/15 1315 06/29/15 0600  WBC 7.4 5.6  HGB 11.8* 11.4*  HCT 37.6 36.8  PLT 400 372    BMET  Recent Labs  06/28/15 1315 06/29/15 0600  NA 139 141  K 4.1 4.2  CL 107 104  CO2 23 28  GLUCOSE 95 92  BUN 7 <5*  CREATININE 0.67 0.77  CALCIUM 9.1 8.8*   PT/INR No results for input(s): LABPROT, INR in the last 72 hours.   Recent Labs Lab 06/28/15 1315 06/29/15 0600  AST 325* 165*  ALT 191* 214*  ALKPHOS 150* 160*  BILITOT 1.0 0.6  PROT 7.2 6.4*  ALBUMIN 3.1* 2.8*     Lipase     Component Value Date/Time   LIPASE 37 06/29/2015 0600     Studies/Results: Dg Cholangiogram Operative  06/29/2015  CLINICAL DATA:  Cholelithiasis. Gallstone pancreatitis. Laparoscopic cholecystectomy. EXAM: INTRAOPERATIVE CHOLANGIOGRAM TECHNIQUE: Cholangiographic images from the C-arm fluoroscopic device were submitted for interpretation post-operatively. Please see the procedural report for the amount of contrast and the fluoroscopy time utilized. COMPARISON:  Abdominal ultrasound yesterday.  FINDINGS: Cannula is present in the cystic duct with excellent opacification of the common bile duct, common hepatic duct and proximal intrahepatic ducts. No filling defects to suggest retained bile duct stones. Normal caliber bile ducts without evidence of obstruction. Excellent antegrade flow into the duodenum. No extravasation at the injection site. IMPRESSION: Normal intraoperative cholangiogram. Electronically Signed   By: Hulan Saas M.D.   On: 06/29/2015 11:27   US Abdomen Complete  06/28/2015  CLINICAL DATA:  Right upper quadrant abdominal pain, acute onset this morning. 2 weeks postpartum from C-section delivery. EXAM: ABDOMEN ULTRASOUND COMPLETE COMPARISON:  07/13/2013 FINDINGS: Gallbladder: Numerous small less than 1 cm gallstones are seen. No evidence of gallbladder wall thickening or pericholecystic fluid. No sonographic Murphy sign noted by sonographer. Common bile duct: Diameter: 6 mm, within normal limits. Liver: No focal lesion identified. Within normal limits in parenchymal echogenicity. IVC: No abnormality visualized. Pancreas: Visualized portion unremarkable. Spleen: Size and appearance within normal limits. Right Kidney: Length: 13.0 cm. Echogenicity within normal limits. No mass or hydronephrosis visualized. Left Kidney: Length: 11.4 cm. Echogenicity within normal limits. No mass or hydronephrosis visualized. Abdominal aorta: No aneurysm visualized. Other findings: None. IMPRESSION: Cholelithiasis. No sonographic signs of acute cholecystitis, biliary dilatation, or other acute findings. No evidence of hydronephrosis. Electronically Signed   By: Myles Rosenthal M.D.   On: 06/28/2015 15:28    Medications: . budesonide-formoterol  2 puff Inhalation Daily  . docusate sodium  100 mg Oral BID  .  enoxaparin (LOVENOX) injection  40 mg Subcutaneous Q24H  . ferrous sulfate  325 mg Oral Q breakfast  . pantoprazole (PROTONIX) IV  40 mg Intravenous QHS  . prenatal multivitamin  1 tablet Oral  Q1200  . sertraline  25 mg Oral Daily   . dextrose 5 % and 0.45 % NaCl with KCl 20 mEq/L 100 mL/hr at 06/30/15 0406   Prior to Admission medications   Medication Sig Start Date End Date Taking? Authorizing Provider  albuterol (PROVENTIL HFA;VENTOLIN HFA) 108 (90 BASE) MCG/ACT inhaler Inhale 2 puffs into the lungs every 6 (six) hours as needed for wheezing.   Yes Historical Provider, MD  albuterol (PROVENTIL) (2.5 MG/3ML) 0.083% nebulizer solution Take 2.5 mg by nebulization every 6 (six) hours as needed for wheezing or shortness of breath.   Yes Historical Provider, MD  budesonide-formoterol (SYMBICORT) 80-4.5 MCG/ACT inhaler Inhale 2 puffs into the lungs daily.   Yes Historical Provider, MD  cetirizine (ZYRTEC) 10 MG tablet Take 10 mg by mouth daily.   Yes Historical Provider, MD  ferrous sulfate 325 (65 FE) MG tablet Take 325 mg by mouth daily with breakfast.   Yes Historical Provider, MD  omeprazole (PRILOSEC) 10 MG capsule Take 10 mg by mouth daily.   Yes Historical Provider, MD  Prenatal Vit-Fe Fumarate-FA (PRENATAL MULTIVITAMIN) TABS tablet Take 1 tablet by mouth daily at 12 noon.   Yes Historical Provider, MD  sertraline (ZOLOFT) 25 MG tablet Take 1 pill daily for 7 days then increase to 2 pills daily Patient taking differently: Take 25 mg by mouth daily. Take 1 pill daily for 7 days then increase to 2 pills daily 06/07/15  Yes Waynard Reeds, MD  docusate sodium (COLACE) 100 MG capsule Take 1 capsule (100 mg total) by mouth 2 (two) times daily. Patient not taking: Reported on 06/28/2015 06/07/15   Waynard Reeds, MD  oxyCODONE-acetaminophen (ROXICET) 5-325 MG tablet Take 1-2 tablets by mouth every 4 (four) hours as needed for severe pain. Patient not taking: Reported on 06/28/2015 06/07/15   Waynard Reeds, MD     Assessment/Plan Biliary Pancreatitis S/p Laparoscopic cholecystectomy with IOC Post Partum, 3 weeks Hx of asthma and GERD Hx of Depression/PTSD/dissociative identity  disorder Anemia Body mass index is 53.4    Plan:  Mobilize, see how she does on oral pain meds,  Saline lock IV and home later this AM.   LOS: 2 days    Shannon Aguirre 06/30/2015

## 2015-06-30 NOTE — Discharge Instructions (Signed)
Laparoscopic Cholecystectomy, Care After °Refer to this sheet in the next few weeks. These instructions provide you with information about caring for yourself after your procedure. Your health care provider may also give you more specific instructions. Your treatment has been planned according to current medical practices, but problems sometimes occur. Call your health care provider if you have any problems or questions after your procedure. °WHAT TO EXPECT AFTER THE PROCEDURE °After your procedure, it is common to have: °· Pain at your incision sites. You will be given pain medicines to control your pain. °· Mild nausea or vomiting. This should improve after the first 24 hours. °· Bloating and possible shoulder pain from the gas that was used during the procedure. This will improve after the first 24 hours. °HOME CARE INSTRUCTIONS °Incision Care °· Follow instructions from your health care provider about how to take care of your incisions. Make sure you: °¨ Wash your hands with soap and water before you change your bandage (dressing). If soap and water are not available, use hand sanitizer. °¨ Change your dressing as told by your health care provider. °¨ Leave stitches (sutures), skin glue, or adhesive strips in place. These skin closures may need to be in place for 2 weeks or longer. If adhesive strip edges start to loosen and curl up, you may trim the loose edges. Do not remove adhesive strips completely unless your health care provider tells you to do that. °· Do not take baths, swim, or use a hot tub until your health care provider approves. Ask your health care provider if you can take showers. You may only be allowed to take sponge baths for bathing. °General Instructions °· Take over-the-counter and prescription medicines only as told by your health care provider. °· Do not drive or operate heavy machinery while taking prescription pain medicine. °· Return to your normal diet as told by your health care  provider. °· Do not lift anything that is heavier than 10 lb (4.5 kg). °· Do not play contact sports for one week or until your health care provider approves. °SEEK MEDICAL CARE IF:  °· You have redness, swelling, or pain at the site of your incision. °· You have fluid, blood, or pus coming from your incision. °· You notice a bad smell coming from your incision area. °· Your surgical incisions break open. °· You have a fever. °SEEK IMMEDIATE MEDICAL CARE IF: °· You develop a rash. °· You have difficulty breathing. °· You have chest pain. °· You have increasing pain in your shoulders (shoulder strap areas). °· You faint or have dizzy episodes while you are standing. °· You have severe pain in your abdomen. °· You have nausea or vomiting that lasts for more than one day. °  °This information is not intended to replace advice given to you by your health care provider. Make sure you discuss any questions you have with your health care provider. °  °Document Released: 05/17/2005 Document Revised: 02/05/2015 Document Reviewed: 12/27/2012 °Elsevier Interactive Patient Education ©2016 [employer] °CCS ______[facility] °LAPAROSCOPIC SURGERY: POST OP INSTRUCTIONS °Always review your discharge instruction sheet given to you by the facility where your surgery was performed. °IF YOU HAVE DISABILITY OR FAMILY LEAVE FORMS, YOU MUST BRING THEM TO THE OFFICE FOR PROCESSING.   °DO NOT GIVE THEM TO YOUR DOCTOR. ° °1. A prescription for pain medication may be given to you upon discharge.  Take your pain medication as prescribed, if needed.  If narcotic   pain medicine is not needed, then you may take acetaminophen (Tylenol) or ibuprofen (Advil) as needed. °2. Take your usually prescribed medications unless otherwise directed. °3. If you need a refill on your pain medication, please contact your pharmacy.  They will contact our office to request authorization. Prescriptions will not be filled after 5pm or on  week-ends. °4. You should follow a light diet the first few days after arrival home, such as soup and crackers, etc.  Be sure to include lots of fluids daily. °5. Most patients will experience some swelling and bruising in the area of the incisions.  Ice packs will help.  Swelling and bruising can take several days to resolve.  °6. It is common to experience some constipation if taking pain medication after surgery.  Increasing fluid intake and taking a stool softener (such as Colace) will usually help or prevent this problem from occurring.  A mild laxative (Milk of Magnesia or Miralax) should be taken according to package instructions if there are no bowel movements after 48 hours. °7. Unless discharge instructions indicate otherwise, you may remove your bandages 24-48 hours after surgery, and you may shower at that time.  You may have steri-strips (small skin tapes) in place directly over the incision.  These strips should be left on the skin for 7-10 days.  If your surgeon used skin glue on the incision, you may shower in 24 hours.  The glue will flake off over the next 2-3 weeks.  Any sutures or staples will be removed at the office during your follow-up visit. °8. ACTIVITIES:  You may resume regular (light) daily activities beginning the next day--such as daily self-care, walking, climbing stairs--gradually increasing activities as tolerated.  You may have sexual intercourse when it is comfortable.  Refrain from any heavy lifting or straining until approved by your doctor. °a. You may drive when you are no longer taking prescription pain medication, you can comfortably wear a seatbelt, and you can safely maneuver your car and apply brakes. °b. RETURN TO WORK:  __________________________________________________________ °9. You should see your doctor in the office for a follow-up appointment approximately 2-3 weeks after your surgery.  Make sure that you call for this appointment within a day or two after you  arrive home to insure a convenient appointment time. °10. OTHER INSTRUCTIONS: __________________________________________________________________________________________________________________________ __________________________________________________________________________________________________________________________ °WHEN TO CALL YOUR DOCTOR: °1. Fever over 101.0 °2. Inability to urinate °3. Continued bleeding from incision. °4. Increased pain, redness, or drainage from the incision. °5. Increasing abdominal pain ° °The clinic staff is available to answer your questions during regular business hours.  Please don’t hesitate to call and ask to speak to one of the nurses for clinical concerns.  If you have a medical emergency, go to the nearest emergency room or call 911.  A surgeon from [facility] is always on call at the hospital. °[address], [address], [place], [place]  [address] ? [address] [address], [place], [place]   [address] °[phone] ? [phone] ? FAX [phone] °Web site: [email or web address] ° °

## 2015-06-30 NOTE — Progress Notes (Deleted)
Pt discharged to home.  Discharge instructions explained to pt.  Pt has no questions at the time of discharge.

## 2015-06-30 NOTE — Discharge Summary (Signed)
Physician Discharge Summary  Patient ID: Shannon Aguirre MRN: [id number] DOB/AGE: Jun 28, 1985 28 y.o.  Admit date: 06/28/2015 Discharge date: 06/30/2015  Admission Diagnoses:  Pancreatitis Cholelithiasis  Post Partum, 3 weeks Hx of asthma and GERD Hx of Depression/PTSD/dissociative identity disorder Anemia Body mass index is 53.4 Discharge Diagnoses:  Same   Active Problems:  Gallstone pancreatitis   PROCEDURES: S/p Laparoscopic cholecystectomy with IOC, 06/29/15,Dr. Doreatha Massed Course:  Started having abdominal pain Thursday, originally thought to be related to her recent C-section, but when evaluated at [facility], it was fetl as though som other etiology was likely. She was sent to [facility] for evaluationa nd found to have likely biliary colic and gallstone pancreatitis. She was admitted by Dr. Lindie Spruce on 06/28/15 and taken to the OR on 06/29/15 by Dr. Andrey Campanile. Pt tolerated the procedure well. She is eating and doing well, anxious to go home now at 11:30 PM. Site all look good, she has an allergy to NSAIDS.  Condition on D/C: Improved      Disposition: 01-Home or Self Care     Medication List    TAKE these medications       acetaminophen 325 MG tablet  Commonly known as: TYLENOL  Take 2 tablets (650 mg total) by mouth every 6 (six) hours as needed (You can only take 4000 mg of acetaminophen per day (Tylenol) it is in your pain medicine so count the amout you take each day. Don't go over 4000 mg.).     albuterol (2.5 MG/3ML) 0.083% nebulizer solution  Commonly known as: PROVENTIL  Take 2.5 mg by nebulization every 6 (six) hours as needed for wheezing or shortness of breath.     albuterol 108 (90 Base) MCG/ACT inhaler  Commonly known as: PROVENTIL HFA;VENTOLIN HFA  Inhale 2 puffs into the lungs every 6 (six) hours as needed for wheezing.     budesonide-formoterol 80-4.5 MCG/ACT inhaler  Commonly known as: SYMBICORT  Inhale  2 puffs into the lungs daily.     cetirizine 10 MG tablet  Commonly known as: ZYRTEC  Take 10 mg by mouth daily.     docusate sodium 100 MG capsule  Commonly known as: COLACE  Take 1 capsule (100 mg total) by mouth 2 (two) times daily.     ferrous sulfate 325 (65 FE) MG tablet  Take 325 mg by mouth daily with breakfast.     omeprazole 10 MG capsule  Commonly known as: PRILOSEC  Take 10 mg by mouth daily.     oxyCODONE-acetaminophen 5-325 MG tablet  Commonly known as: ROXICET  Take 1-2 tablets by mouth every 4 (four) hours as needed for severe pain.     prenatal multivitamin Tabs tablet  Take 1 tablet by mouth daily at 12 noon.     sertraline 25 MG tablet  Commonly known as: ZOLOFT  Take 1 pill daily for 7 days then increase to 2 pills daily           Follow-up Information    Follow up with [facility] On 07/15/2015.   Specialty: General Surgery   Why: Your appointment is at 2 PM, be there 30 minutes early for check in.   Contact information:   [address] [place] [address] [phone]       Follow up with Jackie Plum, MD.   Specialty: Internal Medicine   Why: Call and let them know you have had surgery. Also call Your OB doctor so they will  know.   Contact information:   [address] [place] [address] [phone]       Signed: Sherrie George 06/30/2015, 11:19 AM         Cosigned by: Manus Rudd, MD at 06/30/2015 11:26 AM  Revision History     Date/Time User Provider Type Action   06/30/2015 11:26 AM Manus Rudd, MD Physician Cosign   06/30/2015 11:23 AM Sherrie George, PA-C Physician Assistant Sign

## 2015-06-30 NOTE — Progress Notes (Signed)
Physician Discharge Summary  Patient ID: Shannon Aguirre MRN: [id number] DOB/AGE: 1986-03-20 29 y.o.  Admit date: 06/28/2015 Discharge date: 06/30/2015  Admission Diagnoses:  Pancreatitis Cholelithiasis   Post Partum, 3 weeks Hx of asthma and GERD Hx of Depression/PTSD/dissociative identity disorder Anemia Body mass index is 53.4 Discharge Diagnoses:  Same   Active Problems:   Gallstone pancreatitis   PROCEDURES: S/p Laparoscopic cholecystectomy with IOC, 06/29/15,Dr. Doreatha Massed Course:  Started having abdominal pain Thursday, originally thought to be related to her recent C-section, but when evaluated at [facility], it was fetl as though som other etiology was likely. She was sent to [facility] for evaluationa nd found to have likely biliary colic and gallstone pancreatitis. She was admitted by Dr. Lindie Spruce on 06/28/15 and taken to the OR on 06/29/15 by Dr. Andrey Campanile.  Pt tolerated the procedure well.  She is eating and doing well, anxious to go home now at 11:30 PM.  Site all look good, she has an allergy to NSAIDS.  Condition on D/C:  Improved       Disposition: 01-Home or Self Care     Medication List    TAKE these medications        acetaminophen 325 MG tablet  Commonly known as:  TYLENOL  Take 2 tablets (650 mg total) by mouth every 6 (six) hours as needed (You can only take 4000 mg of acetaminophen per day (Tylenol)  it is in your pain medicine so count the amout you take each day.  Don't go over 4000 mg.).     albuterol (2.5 MG/3ML) 0.083% nebulizer solution  Commonly known as:  PROVENTIL  Take 2.5 mg by nebulization every 6 (six) hours as needed for wheezing or shortness of breath.     albuterol 108 (90 Base) MCG/ACT inhaler  Commonly known as:  PROVENTIL HFA;VENTOLIN HFA  Inhale 2 puffs into the lungs every 6 (six) hours as needed for wheezing.     budesonide-formoterol 80-4.5 MCG/ACT inhaler  Commonly known as:  SYMBICORT  Inhale 2 puffs into the lungs daily.      cetirizine 10 MG tablet  Commonly known as:  ZYRTEC  Take 10 mg by mouth daily.     docusate sodium 100 MG capsule  Commonly known as:  COLACE  Take 1 capsule (100 mg total) by mouth 2 (two) times daily.     ferrous sulfate 325 (65 FE) MG tablet  Take 325 mg by mouth daily with breakfast.     omeprazole 10 MG capsule  Commonly known as:  PRILOSEC  Take 10 mg by mouth daily.     oxyCODONE-acetaminophen 5-325 MG tablet  Commonly known as:  ROXICET  Take 1-2 tablets by mouth every 4 (four) hours as needed for severe pain.     prenatal multivitamin Tabs tablet  Take 1 tablet by mouth daily at 12 noon.     sertraline 25 MG tablet  Commonly known as:  ZOLOFT  Take 1 pill daily for 7 days then increase to 2 pills daily           Follow-up Information    Follow up with [facility] On 07/15/2015.   Specialty:  General Surgery   Why:  Your appointment is at 2 PM, be there 30 minutes early for check in.   Contact information:   [address] [place] [address] [phone]       Follow up with Jackie Plum, MD.   Specialty:  Internal Medicine   Why:  Call and let them know you have had surgery.  Also call Your OB doctor so they will know.   Contact information:   [address] [place] [address] [phone]       Signed: Sherrie George 06/30/2015, 11:19 AM

## 2015-07-01 ENCOUNTER — Other Ambulatory Visit: Payer: Self-pay | Admitting: Obstetrics and Gynecology

## 2015-07-02 LAB — CYTOLOGY - PAP

## 2015-08-16 ENCOUNTER — Emergency Department (HOSPITAL_COMMUNITY)
Admission: EM | Admit: 2015-08-16 | Discharge: 2015-08-17 | Disposition: A | Discharge status: Home / Self Care | Payer: Medicare Other | Attending: Emergency Medicine | Admitting: Emergency Medicine

## 2015-08-16 ENCOUNTER — Encounter (HOSPITAL_COMMUNITY): Payer: Self-pay | Admitting: Emergency Medicine

## 2015-08-16 DIAGNOSIS — D649 Anemia, unspecified: Secondary | ICD-10-CM | POA: Insufficient documentation

## 2015-08-16 DIAGNOSIS — J45909 Unspecified asthma, uncomplicated: Secondary | ICD-10-CM | POA: Insufficient documentation

## 2015-08-16 DIAGNOSIS — A084 Viral intestinal infection, unspecified: Secondary | ICD-10-CM | POA: Diagnosis not present

## 2015-08-16 DIAGNOSIS — Z87891 Personal history of nicotine dependence: Secondary | ICD-10-CM | POA: Insufficient documentation

## 2015-08-16 DIAGNOSIS — F329 Major depressive disorder, single episode, unspecified: Secondary | ICD-10-CM | POA: Diagnosis not present

## 2015-08-16 DIAGNOSIS — Z79899 Other long term (current) drug therapy: Secondary | ICD-10-CM | POA: Insufficient documentation

## 2015-08-16 DIAGNOSIS — K529 Noninfective gastroenteritis and colitis, unspecified: Secondary | ICD-10-CM

## 2015-08-16 DIAGNOSIS — R197 Diarrhea, unspecified: Secondary | ICD-10-CM | POA: Diagnosis present

## 2015-08-16 NOTE — ED Notes (Signed)
Pt states she has been having nausea, vomiting, and diarrhea since yesterday. Pt denies fever or chills. Pt has had 4 episodes of emesis in the past 24 hours. Pt has had 7 instances of diarrhea in the past 24 hours. Pt states she has upper abdominal pain that reminds her of when she had her gallbladder taken out. Pt states she has been unable to hold anything down today. Pt states the pain began first and then the emesis came later.

## 2015-08-17 DIAGNOSIS — A084 Viral intestinal infection, unspecified: Secondary | ICD-10-CM | POA: Diagnosis not present

## 2015-08-17 LAB — URINALYSIS, ROUTINE W REFLEX MICROSCOPIC
GLUCOSE, UA: NEGATIVE mg/dL
HGB URINE DIPSTICK: NEGATIVE
Ketones, ur: NEGATIVE mg/dL
Leukocytes, UA: NEGATIVE
Nitrite: NEGATIVE
Protein, ur: NEGATIVE mg/dL
SPECIFIC GRAVITY, URINE: 1.026 (ref 1.005–1.030)
pH: 5.5 (ref 5.0–8.0)

## 2015-08-17 LAB — CBC
HCT: 37.4 % (ref 36.0–46.0)
HEMOGLOBIN: 11.9 g/dL — AB (ref 12.0–15.0)
MCH: 26.9 pg (ref 26.0–34.0)
MCHC: 31.8 g/dL (ref 30.0–36.0)
MCV: 84.4 fL (ref 78.0–100.0)
PLATELETS: 335 10*3/uL (ref 150–400)
RBC: 4.43 MIL/uL (ref 3.87–5.11)
RDW: 15.7 % — ABNORMAL HIGH (ref 11.5–15.5)
WBC: 7.5 10*3/uL (ref 4.0–10.5)

## 2015-08-17 LAB — COMPREHENSIVE METABOLIC PANEL
ALBUMIN: 4 g/dL (ref 3.5–5.0)
ALK PHOS: 110 U/L (ref 38–126)
ALT: 32 U/L (ref 14–54)
AST: 27 U/L (ref 15–41)
Anion gap: 9 (ref 5–15)
BUN: 9 mg/dL (ref 6–20)
CALCIUM: 8.8 mg/dL — AB (ref 8.9–10.3)
CHLORIDE: 107 mmol/L (ref 101–111)
CO2: 24 mmol/L (ref 22–32)
CREATININE: 0.67 mg/dL (ref 0.44–1.00)
GFR calc Af Amer: >60 mL/min (ref >60)
GFR calc non Af Amer: >60 mL/min (ref >60)
GLUCOSE: 92 mg/dL (ref 65–99)
Potassium: 3.7 mmol/L (ref 3.5–5.1)
SODIUM: 140 mmol/L (ref 135–145)
Total Bilirubin: 0.6 mg/dL (ref 0.3–1.2)
Total Protein: 8 g/dL (ref 6.5–8.1)

## 2015-08-17 LAB — LIPASE, BLOOD: LIPASE: 23 U/L (ref 11–51)

## 2015-08-17 MED ORDER — SODIUM CHLORIDE 0.9 % IV BOLUS (SEPSIS)
1000.0000 mL | Freq: Once | INTRAVENOUS | Status: AC
  Administered 2015-08-17: 1000 mL via INTRAVENOUS

## 2015-08-17 MED ORDER — ONDANSETRON HCL 4 MG/2ML IJ SOLN
4.0000 mg | Freq: Once | INTRAMUSCULAR | Status: AC
  Administered 2015-08-17: 4 mg via INTRAVENOUS
  Filled 2015-08-17: qty 2

## 2015-08-17 MED ORDER — ONDANSETRON 8 MG PO TBDP: ORAL_TABLET | ORAL | Status: DC

## 2015-08-17 NOTE — Discharge Instructions (Signed)
Zofran as prescribed as needed for nausea.  Clear liquid diet for the next 12 hours, then nauseous lead advanced to normal.  Return to the emergency department for severe abdominal pain, bloody stools, or other new and concerning symptoms.

## 2015-08-17 NOTE — ED Provider Notes (Signed)
CSN: [id number]     Arrival date & time 08/16/15  2232 History  By signing my name below, I, Bethel BornBritney McCollum, attest that this documentation has been prepared under the direction and in the presence of Geoffery Lyonsouglas Ashiya Kinkead, MD. Electronically Signed: Bethel BornBritney McCollum, ED Scribe. 08/17/2015. 1:23 AM   Chief Complaint  Patient presents with  . Emesis  . Diarrhea   The history is provided by the patient. No language interpreter was used.   Shannon Aguirre is a 30 y.o. female with history of GERD who presents to the Emergency Department complaining of N/V/D with onset yesterday. She states that she has had constant diarrhea since yesterday and is starting to see blood in her stool.  Associated symptoms include upper abdominal pain. She had similar symptoms before her cholecystectomy in January of this year.  Pt denies hematemesis. She has had no known sick contact and denies recent diet changes. Pt states that she had a c-section in January of this year and recently had her first menstrual period since giving birth.   Past Medical History  Diagnosis Date  . Asthma   . Allergy   . Headache   . GERD (gastroesophageal reflux disease)     with pregnancy  . Anemia   . Depression   . PTSD (post-traumatic stress disorder)   . Dissociative identity disorder    Past Surgical History  Procedure Laterality Date  . Breast surgery  x 2    breast reduction and lumpectomy  . Cesarean section N/A 05/10/2014    Procedure: CESAREAN SECTION;  Surgeon: Freddrick MarchKendra H. Tenny Crawoss, MD;  Location: [facility];  Service: Obstetrics;  Laterality: N/A;  . Cesarean section N/A 06/05/2015    Procedure: CESAREAN SECTION;  Surgeon: Waynard ReedsKendra Ross, MD;  Location: [facility];  Service: Obstetrics;  Laterality: N/A;  . Cholecystectomy N/A 06/29/2015    Procedure: LAPAROSCOPIC CHOLECYSTECTOMY WITH INTRAOPERATIVE CHOLANGIOGRAM;  Surgeon: Gaynelle AduEric Wilson, MD;  [facility];  Service: General;  Laterality: N/A;   Family History  Problem Relation Age of Onset   . Bipolar disorder Mother   . Cancer Father   . Sickle cell anemia Brother   . Breast cancer Maternal Grandmother   . Diabetes Maternal Grandmother   . Diabetes Maternal Grandfather   . Asthma Mother   . Asthma Sister   . Asthma      nephew  . COPD Maternal Aunt    Social History  Substance Use Topics  . Smoking status: Former Smoker -- 1.00 packs/day for 8 years    Types: Cigarettes    Quit date: 09/09/2013  . Smokeless tobacco: Never Used  . Alcohol Use: No   OB History    Gravida Para Term Preterm AB TAB SAB Ectopic Multiple Living   2 2 2       0 2     Review of Systems  10 Systems reviewed and all are negative for acute change except as noted in the HPI.  Allergies  Nsaids and Soybean-containing drug products  Home Medications   Prior to Admission medications   Medication Sig Start Date End Date Taking? Authorizing Provider  acetaminophen (TYLENOL) 325 MG tablet Take 2 tablets (650 mg total) by mouth every 6 (six) hours as needed (You can only take 4000 mg of acetaminophen per day (Tylenol)  it is in your pain medicine so count the amout you take each day.  Don't go over 4000 mg.). 06/30/15   Sherrie GeorgeWillard Jennings, PA-C  albuterol (PROVENTIL HFA;VENTOLIN HFA) 108 (90  BASE) MCG/ACT inhaler Inhale 2 puffs into the lungs every 6 (six) hours as needed for wheezing.    Historical Provider, MD  albuterol (PROVENTIL) (2.5 MG/3ML) 0.083% nebulizer solution Take 2.5 mg by nebulization every 6 (six) hours as needed for wheezing or shortness of breath.    Historical Provider, MD  budesonide-formoterol (SYMBICORT) 80-4.5 MCG/ACT inhaler Inhale 2 puffs into the lungs daily.    Historical Provider, MD  cetirizine (ZYRTEC) 10 MG tablet Take 10 mg by mouth daily.    Historical Provider, MD  ferrous sulfate 325 (65 FE) MG tablet Take 325 mg by mouth daily with breakfast.    Historical Provider, MD  [facility] 0.25-35 MG-MCG tablet Take 1 tablet by mouth daily.  08/03/15   Historical Provider,  MD  omeprazole (PRILOSEC) 10 MG capsule Take 10 mg by mouth daily.    Historical Provider, MD  omeprazole (PRILOSEC) 20 MG capsule Take 20 mg by mouth daily.  08/12/15   Historical Provider, MD  oxyCODONE-acetaminophen (ROXICET) 5-325 MG tablet Take 1-2 tablets by mouth every 4 (four) hours as needed for severe pain. 06/30/15   Sherrie George, PA-C  Prenatal Vit-Fe Fumarate-FA (PRENATAL MULTIVITAMIN) TABS tablet Take 1 tablet by mouth daily at 12 noon.    Historical Provider, MD  sertraline (ZOLOFT) 25 MG tablet Take 1 pill daily for 7 days then increase to 2 pills daily Patient taking differently: Take 25 mg by mouth daily. Take 1 pill daily for 7 days then increase to 2 pills daily 06/07/15   Waynard Reeds, MD  topiramate (TOPAMAX) 25 MG tablet Take 25 mg by mouth daily.  08/14/15   Historical Provider, MD   BP 112/84 mmHg  Pulse 87  Temp(Src) 98.1 F (36.7 C) (Oral)  Resp 16  Ht  (1.626 m)  Wt 307 lb (139.254 kg)  BMI 52.67 kg/m2  SpO2 99%  LMP 07/24/2015 Physical Exam  Constitutional: She is oriented to person, place, and time. She appears well-developed and well-nourished. No distress.  HENT:  Head: Normocephalic and atraumatic.  Eyes: EOM are normal.  Neck: Normal range of motion.  Cardiovascular: Normal rate, regular rhythm and normal heart sounds.   Pulmonary/Chest: Effort normal and breath sounds normal.  Abdominal: Soft. She exhibits no distension. There is tenderness in the epigastric area. There is no rebound and no guarding.  Musculoskeletal: Normal range of motion.  Neurological: She is alert and oriented to person, place, and time.  Skin: Skin is warm and dry.  Psychiatric: She has a normal mood and affect. Judgment normal.  Nursing note and vitals reviewed.   ED Course  Procedures (including critical care time) DIAGNOSTIC STUDIES: Oxygen Saturation is 99% on RA,  normal by my interpretation.    COORDINATION OF CARE: 1:22 AM Discussed treatment plan which  includes lab work, Zofran, and IVF with pt at bedside and pt agreed to plan.  Labs Review Labs Reviewed  CBC - Abnormal; Notable for the following:    Hemoglobin 11.9 (*)    RDW 15.7 (*)    All other components within normal limits  URINALYSIS, ROUTINE W REFLEX MICROSCOPIC (NOT AT [facility]) - Abnormal; Notable for the following:    Color, Urine AMBER (*)    APPearance CLOUDY (*)    Bilirubin Urine SMALL (*)    All other components within normal limits  LIPASE, BLOOD  COMPREHENSIVE METABOLIC PANEL    Imaging Review No results found. I have personally reviewed and evaluated these lab results as part of my  medical decision-making.   EKG Interpretation None      MDM   Final diagnoses:  None    Patient presents with nausea, vomiting, diarrhea for the past 24 hours. She reports having similar symptoms when her gallbladder was removed and January. Her workup reveals normal LFTs, no white count, and normal electrolytes. She was given fluids and Zofran. I suspect her symptoms are likely related to a viral gastroenteritis. She will be discharged with Zofran, fluids as tolerated, when necessary return.  I personally performed the services described in this documentation, which was scribed in my presence. The recorded information has been reviewed and is accurate.       Geoffery Lyons, MD 08/17/15 [phone]

## 2015-12-08 ENCOUNTER — Emergency Department (HOSPITAL_COMMUNITY)
Admission: EM | Admit: 2015-12-08 | Discharge: 2015-12-08 | Disposition: A | Discharge status: Home / Self Care | Payer: Medicare Other | Attending: Dermatology | Admitting: Dermatology

## 2015-12-08 ENCOUNTER — Encounter (HOSPITAL_COMMUNITY): Payer: Self-pay | Admitting: Emergency Medicine

## 2015-12-08 ENCOUNTER — Ambulatory Visit (HOSPITAL_COMMUNITY)
Admission: EM | Admit: 2015-12-08 | Discharge: 2015-12-08 | Disposition: A | Discharge status: Nursing Home (Includes ICF) | Payer: Medicare Other | Attending: Emergency Medicine | Admitting: Emergency Medicine

## 2015-12-08 ENCOUNTER — Emergency Department (HOSPITAL_COMMUNITY): Payer: Medicare Other

## 2015-12-08 ENCOUNTER — Encounter (HOSPITAL_COMMUNITY): Payer: Self-pay | Admitting: *Deleted

## 2015-12-08 DIAGNOSIS — Z87891 Personal history of nicotine dependence: Secondary | ICD-10-CM | POA: Diagnosis not present

## 2015-12-08 DIAGNOSIS — Z5321 Procedure and treatment not carried out due to patient leaving prior to being seen by health care provider: Secondary | ICD-10-CM | POA: Diagnosis not present

## 2015-12-08 DIAGNOSIS — R079 Chest pain, unspecified: Secondary | ICD-10-CM

## 2015-12-08 DIAGNOSIS — F419 Anxiety disorder, unspecified: Secondary | ICD-10-CM | POA: Diagnosis not present

## 2015-12-08 LAB — BASIC METABOLIC PANEL
ANION GAP: 7 (ref 5–15)
BUN: 9 mg/dL (ref 6–20)
CHLORIDE: 111 mmol/L (ref 101–111)
CO2: 21 mmol/L — ABNORMAL LOW (ref 22–32)
Calcium: 9.5 mg/dL (ref 8.9–10.3)
Creatinine, Ser: 0.81 mg/dL (ref 0.44–1.00)
GFR calc Af Amer: >60 mL/min (ref >60)
GLUCOSE: 81 mg/dL (ref 65–99)
POTASSIUM: 3.3 mmol/L — AB (ref 3.5–5.1)
SODIUM: 139 mmol/L (ref 135–145)

## 2015-12-08 LAB — CBC
HEMATOCRIT: 37.3 % (ref 36.0–46.0)
HEMOGLOBIN: 12 g/dL (ref 12.0–15.0)
MCH: 26.5 pg (ref 26.0–34.0)
MCHC: 32.2 g/dL (ref 30.0–36.0)
MCV: 82.5 fL (ref 78.0–100.0)
Platelets: 371 10*3/uL (ref 150–400)
RBC: 4.52 MIL/uL (ref 3.87–5.11)
RDW: 14.9 % (ref 11.5–15.5)
WBC: 9 10*3/uL (ref 4.0–10.5)

## 2015-12-08 LAB — I-STAT TROPONIN, ED: Troponin i, poc: 0 ng/mL (ref 0.00–0.08)

## 2015-12-08 NOTE — Discharge Instructions (Signed)
Go immediately to the ER. Let them know if your chest pain changes, gets worse, or for the signs and symptoms we discussed

## 2015-12-08 NOTE — ED Notes (Signed)
Pt coming from UC with c/o CP, anxiety, and asthma attacks since Sunday. Pt reports using multiple neb treatments with the shortness of breath and CP. Pt states she gets very lightheaded at times.

## 2015-12-08 NOTE — ED Notes (Signed)
Patient reports  Greater than a week of symptoms : chest pain, sob, numbness in hands and feet, left arm , face and back pain and numbness.  Patient has tried tylenol, inhalers and nebulizer treatments.  Patient reports a syncopal episode on Saturday.  Patient is vague about recent stress and anxiety.  Patient repeatedly references the stress she is under.  Patient is not sob, patient is very talkative, skin warm and dry.  No nausea, no vomiting

## 2015-12-08 NOTE — ED Provider Notes (Signed)
HPI  SUBJECTIVE:  Shannon Aguirre is a 30 y.o. female who presents with substernal chest pain described as heaviness, pressure for the past week. States it became constant starting yesterday. She reports having a syncopal episode 2 days ago while at rest. She states that was preceded with nausea, diaphoresis, but no tenderness or tunnel vision. She reports shortness of breath, coughing, wheezing, and has thought that she has been having "asthma attacks" and has been trying multiple nebulizer treatments without much improvement in her pain. She states the pain is now radiating down her left arm and through to her back. She states it is not radiating up into her neck. She states her symptoms are worse with exertion, no alleviating factors. She denies calf pain, swelling,  orthopnea, nocturia, unintentional weight gain. She reports waking up at night short of breath. No diarrhea, epistaxis, heavy vaginal bleeding, melena, hematochezia. She is on Depo-Provera, she is not on any OCPs. She has tried Tylenol, caffeine, nebulizer treatments. She states that this chest pain does not feel like previous episodes of asthma or acid reflux. states that she is compliant with her omeprazole for the GERD. She has has a past medical history of anxiety, but this is not currently treated. She has no history of diabetes, hypertension, MI, coronary artery disease. She is not a smoker. No history of PE, DVT, cancer, syncope. No history of CHF, illicit drug use. Family history negative for MI, sudden cardiac death or syncope. PMD: Dr. Julio Sickssei-Bonsu. LMP: 6/29, she denies possibility of being pregnant.    Past Medical History  Diagnosis Date  . Asthma   . Allergy   . Headache   . GERD (gastroesophageal reflux disease)     with pregnancy  . Anemia   . Depression   . PTSD (post-traumatic stress disorder)   . Dissociative identity disorder     Past Surgical History  Procedure Laterality Date  . Breast surgery  x 2    breast  reduction and lumpectomy  . Cesarean section N/A 05/10/2014    Procedure: CESAREAN SECTION;  Surgeon: Freddrick MarchKendra H. Tenny Crawoss, MD;  Location: [facility];  Service: Obstetrics;  Laterality: N/A;  . Cesarean section N/A 06/05/2015    Procedure: CESAREAN SECTION;  Surgeon: Waynard ReedsKendra Ross, MD;  Location: [facility];  Service: Obstetrics;  Laterality: N/A;  . Cholecystectomy N/A 06/29/2015    Procedure: LAPAROSCOPIC CHOLECYSTECTOMY WITH INTRAOPERATIVE CHOLANGIOGRAM;  Surgeon: Gaynelle AduEric Wilson, MD;  [facility];  Service: General;  Laterality: N/A;    Family History  Problem Relation Age of Onset  . Bipolar disorder Mother   . Cancer Father   . Sickle cell anemia Brother   . Breast cancer Maternal Grandmother   . Diabetes Maternal Grandmother   . Diabetes Maternal Grandfather   . Asthma Mother   . Asthma Sister   . Asthma      nephew  . COPD Maternal Aunt     Social History  Substance Use Topics  . Smoking status: Former Smoker -- 1.00 packs/day for 8 years    Types: Cigarettes    Quit date: 09/09/2013  . Smokeless tobacco: Never Used  . Alcohol Use: No    No current facility-administered medications for this encounter.  Current outpatient prescriptions:  .  acetaminophen (TYLENOL) 325 MG tablet, Take 2 tablets (650 mg total) by mouth every 6 (six) hours as needed (You can only take 4000 mg of acetaminophen per day (Tylenol)  it is in your pain medicine so count the amout  you take each day.  Don't go over 4000 mg.)., Disp: , Rfl:  .  albuterol (PROVENTIL HFA;VENTOLIN HFA) 108 (90 BASE) MCG/ACT inhaler, Inhale 2 puffs into the lungs every 6 (six) hours as needed for wheezing., Disp: , Rfl:  .  albuterol (PROVENTIL) (2.5 MG/3ML) 0.083% nebulizer solution, Take 2.5 mg by nebulization every 6 (six) hours as needed for wheezing or shortness of breath., Disp: , Rfl:  .  budesonide-formoterol (SYMBICORT) 80-4.5 MCG/ACT inhaler, Inhale 2 puffs into the lungs daily., Disp: , Rfl:  .  cetirizine (ZYRTEC) 10 MG  tablet, Take 10 mg by mouth daily., Disp: , Rfl:  .  ferrous sulfate 325 (65 FE) MG tablet, Take 325 mg by mouth daily with breakfast., Disp: , Rfl:  .  MONO-LINYAH 0.25-35 MG-MCG tablet, Take 1 tablet by mouth daily. , Disp: , Rfl:  .  omeprazole (PRILOSEC) 20 MG capsule, Take 20 mg by mouth daily. , Disp: , Rfl:  .  ondansetron (ZOFRAN ODT) 8 MG disintegrating tablet, 8mg  ODT q4 hours prn nausea, Disp: 6 tablet, Rfl: 0 .  PHENTERMINE HCL PO, Take by mouth., Disp: , Rfl:  .  Prenatal Vit-Fe Fumarate-FA (PRENATAL MULTIVITAMIN) TABS tablet, Take 1 tablet by mouth daily at 12 noon., Disp: , Rfl:  .  sertraline (ZOLOFT) 25 MG tablet, Take 1 pill daily for 7 days then increase to 2 pills daily (Patient taking differently: Take 50 mg by mouth daily. ), Disp: 49 tablet, Rfl: 6 .  topiramate (TOPAMAX) 25 MG tablet, Take 25 mg by mouth daily. , Disp: , Rfl:   Allergies  Allergen Reactions  . Nsaids Anaphylaxis  . Soybean-Containing Drug Products Swelling    Swelling of throat Swelling of lips     ROS  As noted in HPI.   Physical Exam  BP 108/73 mmHg  Pulse 85  Temp(Src) 99.2 F (37.3 C) (Oral)  Resp 22  SpO2 99%  LMP 11/24/2015  Constitutional: Well developed, well nourished, no acute distress Eyes: PERRL, EOMI, conjunctiva normal bilaterally HENT: Normocephalic, atraumatic,mucus membranes moist Respiratory: Clear to auscultation bilaterally, no rales, no wheezing, no rhonchi Cardiovascular: Normal rate and rhythm, no murmurs, no gallops, no rubs. RP, DP 2+ equal bilaterally GI: Soft, nondistended, normal bowel sounds, nontender, no rebound, no guarding skin: No rash, skin intact Musculoskeletal: Calves symmetric, nontender No edema, no tenderness, no deformities Neurologic: Alert & oriented x 3, CN II-XII grossly intact, no motor deficits, sensation grossly intact Psychiatric: Speech and behavior appropriate   ED Course   Medications - No data to display  Orders Placed This  Encounter  Procedures  . ED EKG    Standing Status: Standing     Number of Occurrences: 1     Standing Expiration Date:     Order Specific Question:  Reason for Exam    Answer:  Chest Pain   No results found for this or any previous visit (from the past 24 hour(s)). No results found.  ED Clinical Impression  Chest pain, unspecified chest pain type   ED Assessment/Plan  EKG: Normal sinus rhythm, rate 84. Normal axis, normal intervals. No hypertrophy. No ST-T wave changes compared to EKG from 04/2015.   Concern for serious cause of her chest pain. it is different from her usual asthma exacerbations, fact that is not responding to albuterol treatments, and is different from her previous episodes of acid reflux, and the fact that she had an unexplained syncopal episode 2 days ago. She has a normal EKG,  she has normal vitals. She was symptomatic while the EKG was obtained. She is not tachycardic, hypoxic. Feel that she is stable to go to the ER via shuttle. Discussed rationale for the transfer with patient. She agrees with plan.  *This clinic note was created using Dragon dictation software. Therefore, there may be occasional mistakes despite careful proofreading.  ?  Domenick Gong, MD 12/08/15 2040

## 2015-12-08 NOTE — ED Notes (Addendum)
Pt said stated she did not want to wait any longer. Pt left.

## 2015-12-09 ENCOUNTER — Emergency Department (HOSPITAL_COMMUNITY)
Admission: EM | Admit: 2015-12-09 | Discharge: 2015-12-09 | Disposition: A | Discharge status: Home / Self Care | Payer: Medicare Other | Attending: Emergency Medicine | Admitting: Emergency Medicine

## 2015-12-09 ENCOUNTER — Emergency Department (HOSPITAL_COMMUNITY): Payer: Medicare Other

## 2015-12-09 ENCOUNTER — Encounter (HOSPITAL_COMMUNITY): Payer: Self-pay

## 2015-12-09 DIAGNOSIS — Z87891 Personal history of nicotine dependence: Secondary | ICD-10-CM | POA: Diagnosis not present

## 2015-12-09 DIAGNOSIS — R079 Chest pain, unspecified: Secondary | ICD-10-CM | POA: Insufficient documentation

## 2015-12-09 DIAGNOSIS — J45909 Unspecified asthma, uncomplicated: Secondary | ICD-10-CM | POA: Insufficient documentation

## 2015-12-09 LAB — I-STAT TROPONIN, ED: TROPONIN I, POC: 0 ng/mL (ref 0.00–0.08)

## 2015-12-09 LAB — CBC WITH DIFFERENTIAL/PLATELET
BASOS PCT: 0 %
Basophils Absolute: 0 10*3/uL (ref 0.0–0.1)
EOS ABS: 0.2 10*3/uL (ref 0.0–0.7)
Eosinophils Relative: 2 %
HCT: 35.5 % — ABNORMAL LOW (ref 36.0–46.0)
Hemoglobin: 11.5 g/dL — ABNORMAL LOW (ref 12.0–15.0)
Lymphocytes Relative: 33 %
Lymphs Abs: 2.6 10*3/uL (ref 0.7–4.0)
MCH: 26.9 pg (ref 26.0–34.0)
MCHC: 32.4 g/dL (ref 30.0–36.0)
MCV: 82.9 fL (ref 78.0–100.0)
MONO ABS: 0.5 10*3/uL (ref 0.1–1.0)
MONOS PCT: 6 %
Neutro Abs: 4.7 10*3/uL (ref 1.7–7.7)
Neutrophils Relative %: 59 %
Platelets: 318 10*3/uL (ref 150–400)
RBC: 4.28 MIL/uL (ref 3.87–5.11)
RDW: 14.8 % (ref 11.5–15.5)
WBC: 8 10*3/uL (ref 4.0–10.5)

## 2015-12-09 LAB — BASIC METABOLIC PANEL
Anion gap: 6 (ref 5–15)
BUN: 9 mg/dL (ref 6–20)
CALCIUM: 9 mg/dL (ref 8.9–10.3)
CO2: 20 mmol/L — AB (ref 22–32)
CREATININE: 0.77 mg/dL (ref 0.44–1.00)
Chloride: 113 mmol/L — ABNORMAL HIGH (ref 101–111)
GFR calc non Af Amer: >60 mL/min (ref >60)
Glucose, Bld: 86 mg/dL (ref 65–99)
Potassium: 3.5 mmol/L (ref 3.5–5.1)
SODIUM: 139 mmol/L (ref 135–145)

## 2015-12-09 LAB — I-STAT BETA HCG BLOOD, ED (MC, WL, AP ONLY)

## 2015-12-09 LAB — D-DIMER, QUANTITATIVE: D-Dimer, Quant: 0.73 ug/mL-FEU — ABNORMAL HIGH (ref 0.00–0.50)

## 2015-12-09 MED ORDER — IOPAMIDOL (ISOVUE-370) INJECTION 76%
INTRAVENOUS | Status: AC
  Administered 2015-12-09: 100 mL
  Filled 2015-12-09: qty 100

## 2015-12-09 NOTE — ED Notes (Signed)
Pt reports chest pain that started a week ago and a syncopal episode on Saturday so she came in yesterday, the wait got to be too long and she had her son with her so she left, she then had another syncopal episode this morning and continues to have chest pain so she came back, patient reports that her chest pain goes into her back and she also feel short of breath, she received two sl nitro in route with ems and that relieved her back pain some but continues to have chest pain, alert and oriented on arrival to ed

## 2015-12-09 NOTE — ED Notes (Signed)
Pt had questions answered by Dr. Karma GanjaLinker and is ready for discharge.

## 2015-12-09 NOTE — ED Provider Notes (Signed)
CSN: [id number]     Arrival date & time 12/09/15  1405 History   First MD Initiated Contact with Patient 12/09/15 1413     Chief Complaint  Patient presents with  . Chest Pain  . Loss of Consciousness  . Shortness of Breath   HPI Pt has been having trouble with chest pain for the last week.  THe pain has been constant now since Saturday.  It is is the middle of the chest and in the back.  It feels like a pressure.  No vomiting or diarrhea.  SHe was seen at an UC last night.  They suggeted she come to the ED.  The wait was too long.  She went back to her doctor today who sent her back to the ED. No leg swellling.  No history of DVT or PE.  She is not on birth control pills.    Past Medical History  Diagnosis Date  . Asthma   . Allergy   . Headache   . GERD (gastroesophageal reflux disease)     with pregnancy  . Anemia   . Depression   . PTSD (post-traumatic stress disorder)   . Dissociative identity disorder    Past Surgical History  Procedure Laterality Date  . Breast surgery  x 2    breast reduction and lumpectomy  . Cesarean section N/A 05/10/2014    Procedure: CESAREAN SECTION;  Surgeon: Freddrick March. Tenny Craw, MD;  Location: [facility];  Service: Obstetrics;  Laterality: N/A;  . Cesarean section N/A 06/05/2015    Procedure: CESAREAN SECTION;  Surgeon: Waynard Reeds, MD;  Location: [facility];  Service: Obstetrics;  Laterality: N/A;  . Cholecystectomy N/A 06/29/2015    Procedure: LAPAROSCOPIC CHOLECYSTECTOMY WITH INTRAOPERATIVE CHOLANGIOGRAM;  Surgeon: Gaynelle Adu, MD;  Location: [facility];  Service: General;  Laterality: N/A;   Family History  Problem Relation Age of Onset  . Bipolar disorder Mother   . Cancer Father   . Sickle cell anemia Brother   . Breast cancer Maternal Grandmother   . Diabetes Maternal Grandmother   . Diabetes Maternal Grandfather   . Asthma Mother   . Asthma Sister   . Asthma      nephew  . COPD Maternal Aunt    Social History  Substance Use Topics  . Smoking  status: Former Smoker -- 1.00 packs/day for 8 years    Types: Cigarettes    Quit date: 09/09/2013  . Smokeless tobacco: Never Used  . Alcohol Use: No   OB History    Gravida Para Term Preterm AB TAB SAB Ectopic Multiple Living   0 2     Review of Systems    Allergies  Nsaids and Soybean-containing drug products  Home Medications   Prior to Admission medications   Medication Sig Start Date End Date Taking? Authorizing Provider  acetaminophen (TYLENOL) 325 MG tablet Take 2 tablets (650 mg total) by mouth every 6 (six) hours as needed (You can only take 4000 mg of acetaminophen per day (Tylenol)  it is in your pain medicine so count the amout you take each day.  Don't go over 4000 mg.). 06/30/15   Sherrie George, PA-C  albuterol (PROVENTIL HFA;VENTOLIN HFA) 108 (90 BASE) MCG/ACT inhaler Inhale 2 puffs into the lungs every 6 (six) hours as needed for wheezing.    Historical Provider, MD  albuterol (PROVENTIL) (2.5 MG/3ML) 0.083% nebulizer solution Take 2.5 mg by nebulization every 6 (six) hours as  needed for wheezing or shortness of breath.    Historical Provider, MD  budesonide-formoterol (SYMBICORT) 80-4.5 MCG/ACT inhaler Inhale 2 puffs into the lungs daily.    Historical Provider, MD  cetirizine (ZYRTEC) 10 MG tablet Take 10 mg by mouth daily.    Historical Provider, MD  ferrous sulfate 325 (65 FE) MG tablet Take 325 mg by mouth daily with breakfast.    Historical Provider, MD  [facility] 0.25-35 MG-MCG tablet Take 1 tablet by mouth daily.  08/03/15   Historical Provider, MD  omeprazole (PRILOSEC) 20 MG capsule Take 20 mg by mouth daily.  08/12/15   Historical Provider, MD  ondansetron (ZOFRAN ODT) 8 MG disintegrating tablet 8mg  ODT q4 hours prn nausea 08/17/15   Geoffery Lyons, MD  PHENTERMINE HCL PO Take by mouth.    Historical Provider, MD  Prenatal Vit-Fe Fumarate-FA (PRENATAL MULTIVITAMIN) TABS tablet Take 1 tablet by mouth daily at 12 noon.    Historical Provider, MD   sertraline (ZOLOFT) 25 MG tablet Take 1 pill daily for 7 days then increase to 2 pills daily Patient taking differently: Take 50 mg by mouth daily.  06/07/15   Waynard Reeds, MD  topiramate (TOPAMAX) 25 MG tablet Take 25 mg by mouth daily.  08/14/15   Historical Provider, MD   BP 105/60 mmHg  Pulse 79  Temp(Src) 98.2 F (36.8 C) (Oral)  Resp 20  Ht 5\' 4"  (1.626 m)  Wt 127.461 kg  BMI 48.21 kg/m2  SpO2 100%  LMP 11/24/2015 Physical Exam  Constitutional: She appears well-developed and well-nourished. No distress.  HENT:  Head: Normocephalic and atraumatic.  Right Ear: External ear normal.  Left Ear: External ear normal.  Eyes: Conjunctivae are normal. Right eye exhibits no discharge. Left eye exhibits no discharge. No scleral icterus.  Neck: Neck supple. No tracheal deviation present.  Cardiovascular: Normal rate, regular rhythm and intact distal pulses.   Pulmonary/Chest: Effort normal and breath sounds normal. No stridor. No respiratory distress. She has no wheezes. She has no rales.  Abdominal: Soft. Bowel sounds are normal. She exhibits no distension. There is no tenderness. There is no rebound and no guarding.  Musculoskeletal: She exhibits no edema or tenderness.  Neurological: She is alert. She has normal strength. No cranial nerve deficit (no facial droop, extraocular movements intact, no slurred speech) or sensory deficit. She exhibits normal muscle tone. She displays no seizure activity. Coordination normal.  Skin: Skin is warm and dry. No rash noted.  Psychiatric: She has a normal mood and affect.  Nursing note and vitals reviewed.   ED Course  Procedures (including critical care time) Labs Review Labs Reviewed - No data to display  Imaging Review Dg Chest 2 View  12/08/2015  CLINICAL DATA:  30 year old female with chest pain EXAM: CHEST  2 VIEW COMPARISON:  None. FINDINGS: The heart size and mediastinal contours are within normal limits. Both lungs are clear. The  visualized skeletal structures are unremarkable. IMPRESSION: No active cardiopulmonary disease. Electronically Signed   By: Elgie Collard M.D.   On: 12/08/2015 21:58   I have personally reviewed and evaluated these images and lab results as part of my medical decision-making.   EKG Interpretation   Date/Time:  Tuesday December 09 2015 14:06:54 EDT Ventricular Rate:  83 PR Interval:    QRS Duration: 96 QT Interval:  361 QTC Calculation: 425 R Axis:   82 Text Interpretation:  Sinus rhythm No significant change since last  tracing Confirmed by Alexx Mcburney  MD-J, Jerriyah Louis [phone])  on 12/09/2015 2:13:26 PM      MDM   Sx are atypical for heart disease.  Overall low risk.  Cardiac markers and EKG are normal.  CXR last night was normal.  D dimer is elevated today.  Will ct chest to evaluate for PE.  If negative, stable for discharge.    Dr Karma GanjaLinker will follow up on the CT.     Linwood DibblesJon Aamirah Salmi, MD 12/09/15 [phone]

## 2015-12-09 NOTE — ED Notes (Signed)
Pt had questions r/t discharge.  Pt wants clarification on diagnosis and f/u plan.  Dr. Karma GanjaLinker made aware and will go in and speak with pt.

## 2015-12-09 NOTE — Discharge Instructions (Signed)
Return to the ED with any concerns including difficulty breathing, fainting, weakness of arms or legs, fainting, decreased level of alertness/lethargy, or any other alarming symptoms

## 2015-12-15 IMAGING — US US OB COMP LESS 14 WK
1 series · 14 of 28 positions shown · non-contrast
Comparison: None.

CLINICAL DATA: Abdominal pain

EXAM:
OBSTETRIC <14 WK US AND TRANSVAGINAL OB US
TECHNIQUE: Both transabdominal and transvaginal ultrasound examinations were
performed for complete evaluation of the gestation as well as the
maternal uterus, adnexal regions, and pelvic cul-de-sac.
Transvaginal technique was performed to assess early pregnancy.

[Series 1: us ob comp less 14 wk · 0.22mm/px · 14 of 39 slices shown]
[im 2/39]
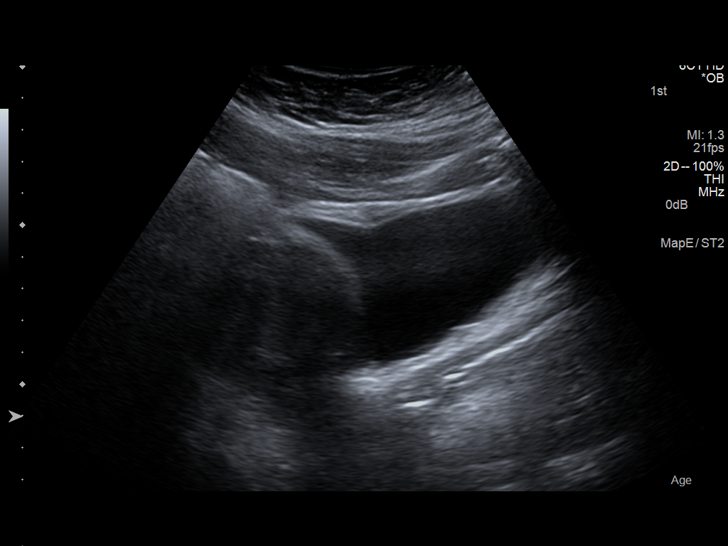
[im 5/39]
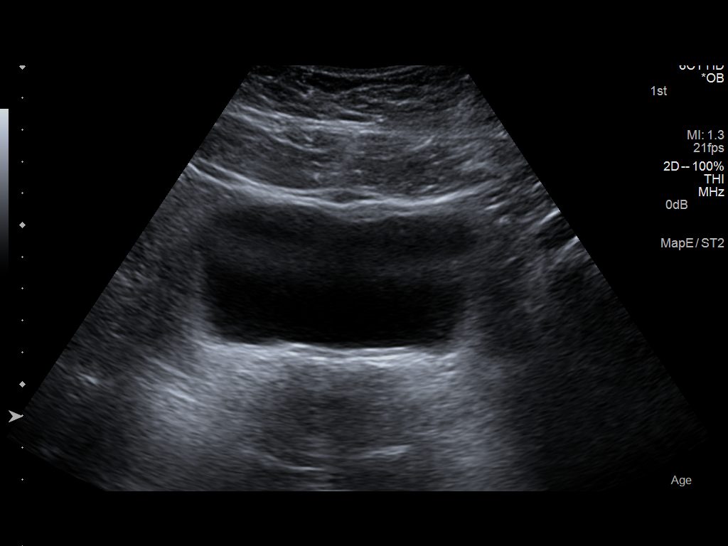
[im 8/39]
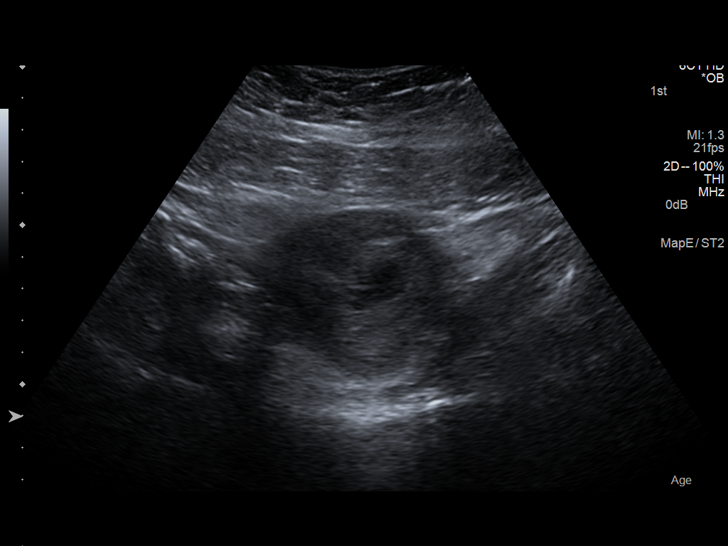
[im 10/39]
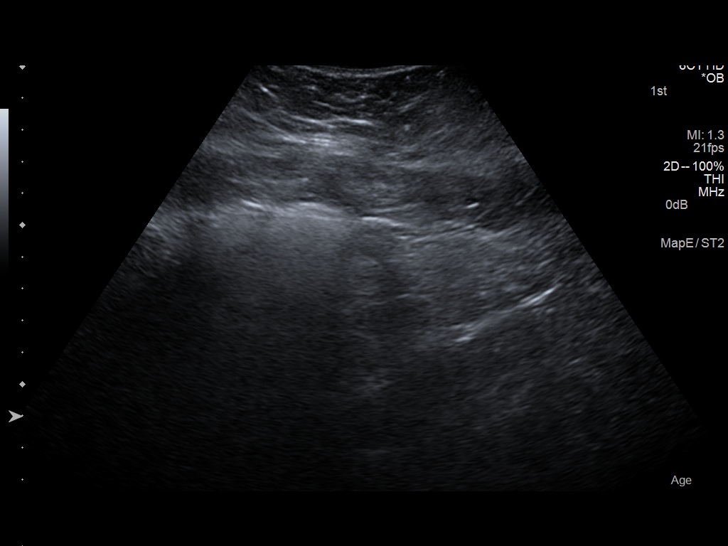
[im 13/39]
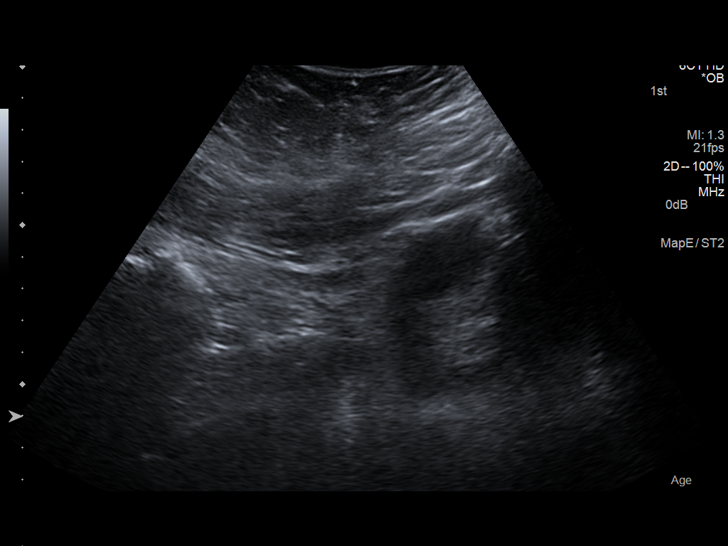
[im 16/39]
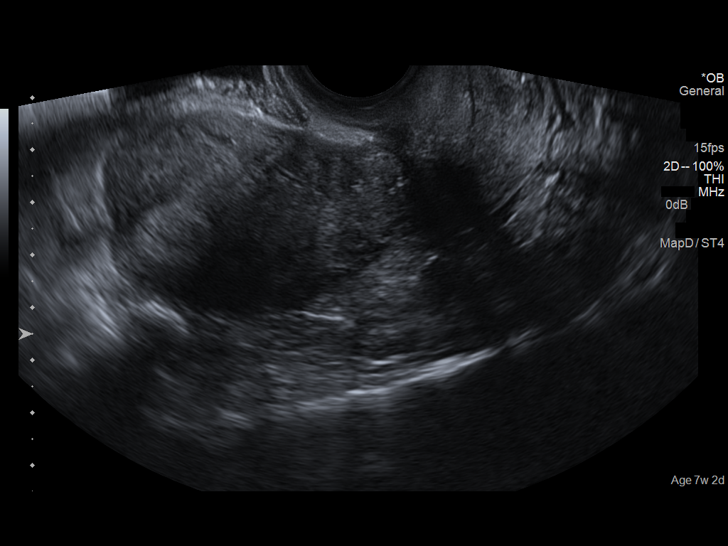
[im 19/39]
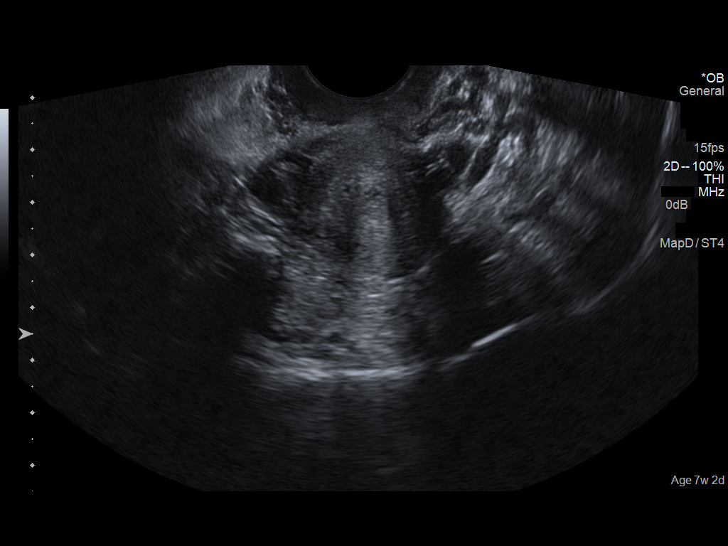
[im 22/39]
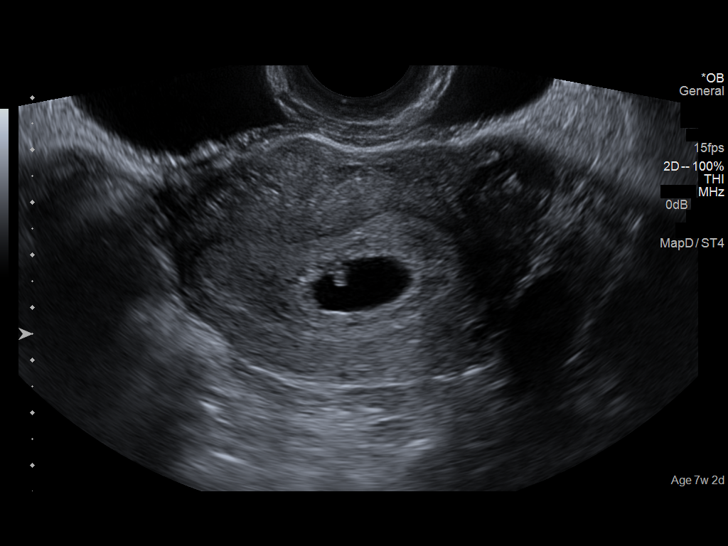
[im 24/39]
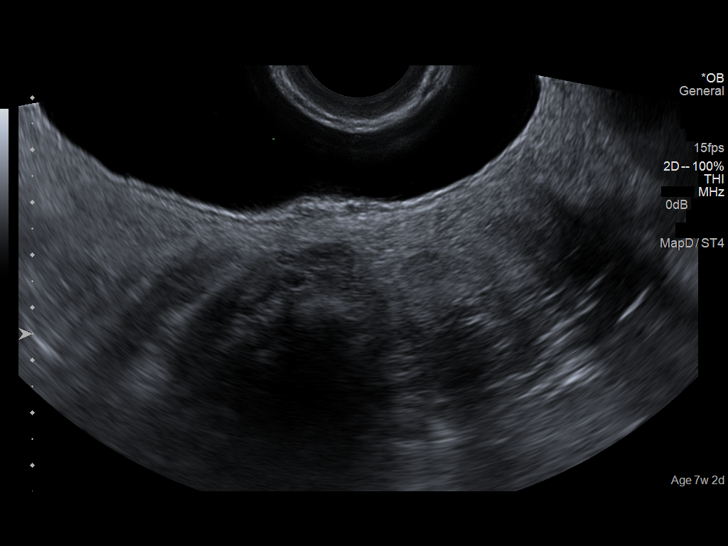
[im 27/39]
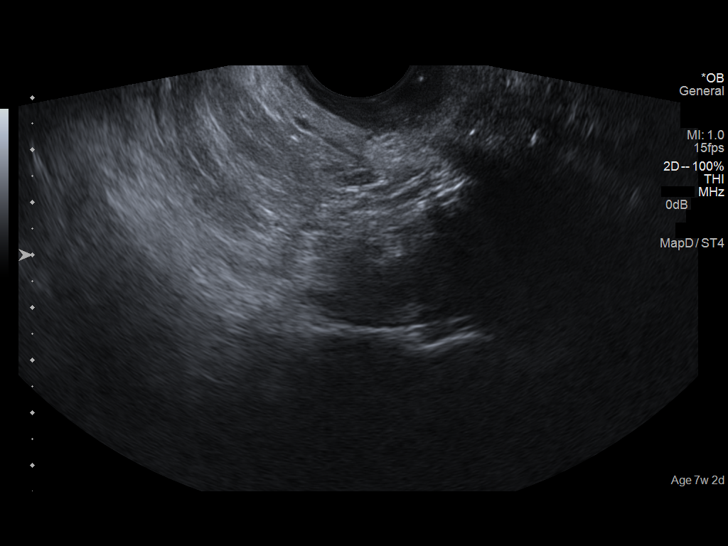
[im 30/39]
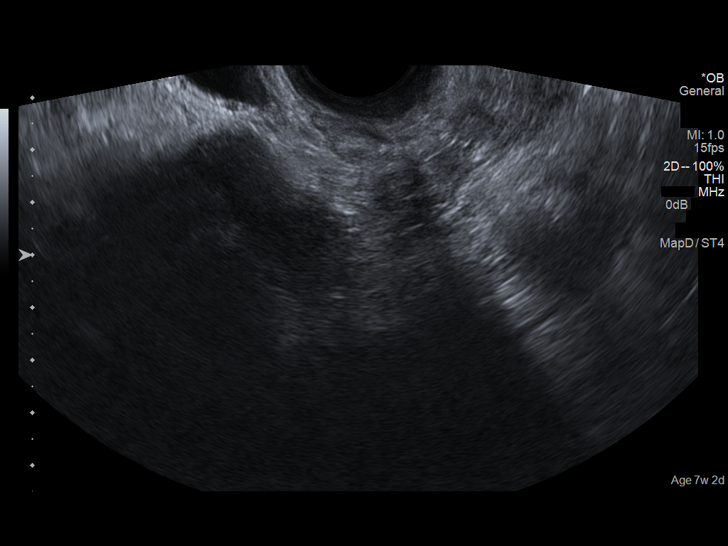
[im 33/39]
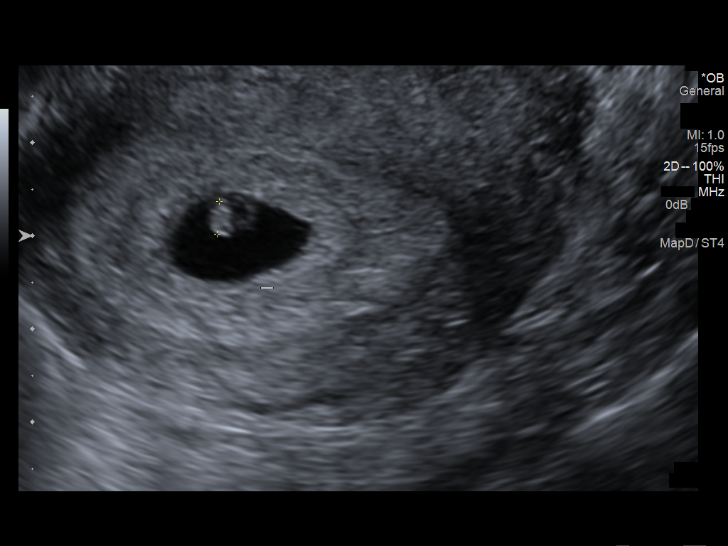
[im 36/39]
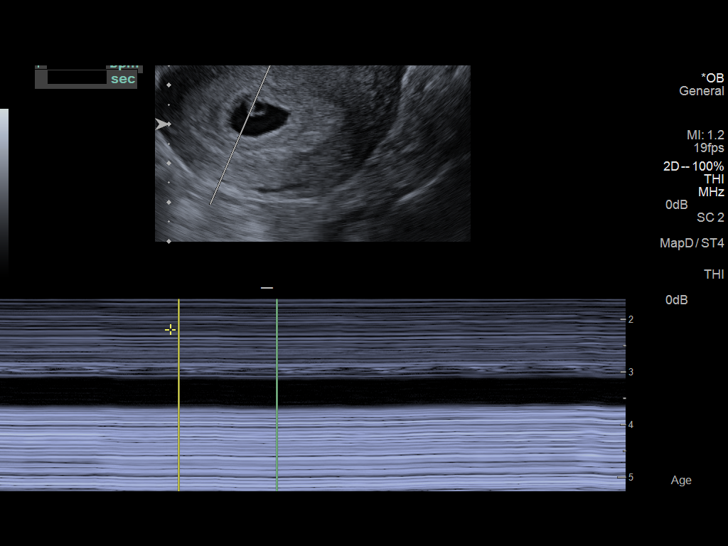
[im 39/39]
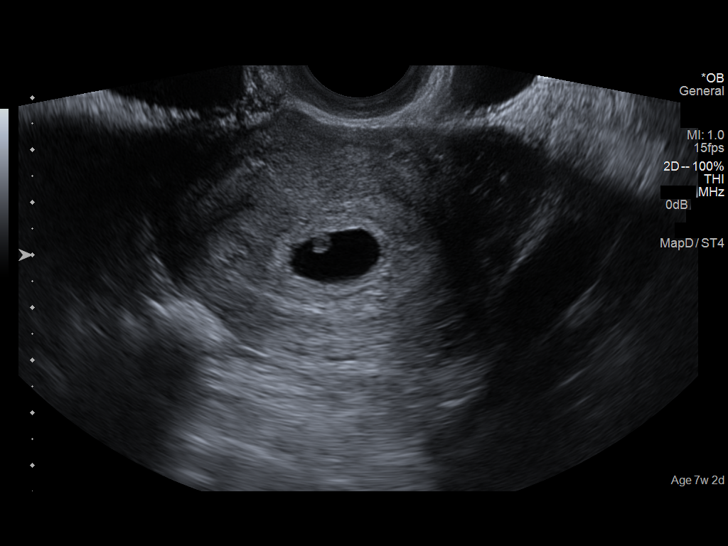

[14 of 28 positions shown; findings below may reference images not displayed]

FINDINGS: Intrauterine gestational sac: Visualized/normal in shape.

Yolk sac:  Present.

Embryo:  Present.

Cardiac Activity: Present.

Heart Rate:  105 bpm

CRL:   3.3  mm   6 w 0d                  US EDC: 05/10/2014

Maternal uterus/adnexae: Normal bilateral ovaries. No adnexal mass.
No pelvic free fluid.
IMPRESSION: Single live intrauterine pregnancy dating 6 weeks 0 days with an
ultrasound EDC of 05/10/2014.

## 2015-12-16 ENCOUNTER — Emergency Department (HOSPITAL_COMMUNITY)
Admission: EM | Admit: 2015-12-16 | Discharge: 2015-12-17 | Disposition: A | Discharge status: Home / Self Care | Payer: Medicare Other | Attending: Emergency Medicine | Admitting: Emergency Medicine

## 2015-12-16 ENCOUNTER — Encounter (HOSPITAL_COMMUNITY): Payer: Self-pay | Admitting: Emergency Medicine

## 2015-12-16 ENCOUNTER — Emergency Department (HOSPITAL_COMMUNITY): Payer: Medicare Other

## 2015-12-16 DIAGNOSIS — Z79899 Other long term (current) drug therapy: Secondary | ICD-10-CM | POA: Insufficient documentation

## 2015-12-16 DIAGNOSIS — J45909 Unspecified asthma, uncomplicated: Secondary | ICD-10-CM | POA: Diagnosis not present

## 2015-12-16 DIAGNOSIS — Z87891 Personal history of nicotine dependence: Secondary | ICD-10-CM | POA: Insufficient documentation

## 2015-12-16 DIAGNOSIS — E876 Hypokalemia: Secondary | ICD-10-CM | POA: Diagnosis not present

## 2015-12-16 DIAGNOSIS — R079 Chest pain, unspecified: Secondary | ICD-10-CM | POA: Diagnosis not present

## 2015-12-16 LAB — CBC
HCT: 34.8 % — ABNORMAL LOW (ref 36.0–46.0)
HEMOGLOBIN: 11.5 g/dL — AB (ref 12.0–15.0)
MCH: 26.9 pg (ref 26.0–34.0)
MCHC: 33 g/dL (ref 30.0–36.0)
MCV: 81.5 fL (ref 78.0–100.0)
PLATELETS: 310 10*3/uL (ref 150–400)
RBC: 4.27 MIL/uL (ref 3.87–5.11)
RDW: 14.8 % (ref 11.5–15.5)
WBC: 7.9 10*3/uL (ref 4.0–10.5)

## 2015-12-16 LAB — BASIC METABOLIC PANEL
ANION GAP: 9 (ref 5–15)
BUN: 6 mg/dL (ref 6–20)
CALCIUM: 9.4 mg/dL (ref 8.9–10.3)
CO2: 19 mmol/L — ABNORMAL LOW (ref 22–32)
CREATININE: 0.73 mg/dL (ref 0.44–1.00)
Chloride: 114 mmol/L — ABNORMAL HIGH (ref 101–111)
GFR calc non Af Amer: >60 mL/min (ref >60)
Glucose, Bld: 115 mg/dL — ABNORMAL HIGH (ref 65–99)
Potassium: 2.9 mmol/L — ABNORMAL LOW (ref 3.5–5.1)
SODIUM: 142 mmol/L (ref 135–145)

## 2015-12-16 LAB — CBG MONITORING, ED: Glucose-Capillary: 121 mg/dL — ABNORMAL HIGH (ref 65–99)

## 2015-12-16 LAB — I-STAT TROPONIN, ED: TROPONIN I, POC: 0 ng/mL (ref 0.00–0.08)

## 2015-12-16 LAB — I-STAT BETA HCG BLOOD, ED (MC, WL, AP ONLY): I-stat hCG, quantitative: <5 m[IU]/mL (ref <5)

## 2015-12-16 MED ORDER — COLCHICINE 0.6 MG PO TABS: 0.6000 mg | ORAL_TABLET | Freq: Every day | ORAL | Status: DC

## 2015-12-16 MED ORDER — OXYCODONE HCL 5 MG PO TABS: 5.0000 mg | ORAL_TABLET | Freq: Four times a day (QID) | ORAL | Status: DC | PRN

## 2015-12-16 MED ORDER — POTASSIUM CHLORIDE CRYS ER 20 MEQ PO TBCR
40.0000 meq | EXTENDED_RELEASE_TABLET | Freq: Once | ORAL | Status: AC
  Administered 2015-12-16: 40 meq via ORAL
  Filled 2015-12-16: qty 2

## 2015-12-16 NOTE — ED Provider Notes (Signed)
CSN: [id number]     Arrival date & time 12/16/15  2131 History   First MD Initiated Contact with Patient 12/16/15 2143     Chief Complaint  Patient presents with  . Shortness of Breath   (Consider location/radiation/quality/duration/timing/severity/associated sxs/prior Treatment) HPI 30 y.o. female with a hx of Asthma, presents to the Emergency Department today complaining of chest pain x 2 weeks with radiation into middle back. Seen previously on 12/09/15 for similar occurence. Workup unremarkable. CT Angio unremarkable. Pt presents today because she had new new onset left arm numbness as well that was new from before. Has since resolved in ED. Left arm pain lasted several hours today. No diaphoresis. No N/V. States chest pain has been constant unrelenting pain x 2 weeks with pain 7/10. Seen by PCP today as well and believed to be Pulmonary Hypertension as the cause. Given referrals to Cardiology and Pulmonology. Pt was given Solumedrol, Albuterol en route via EMS. Pt states that 1 NTG helped with symptoms. No fevers. No headaches. No abdominal pain. No numbness/tingling. No other symptoms noted.     Past Medical History  Diagnosis Date  . Asthma   . Allergy   . Headache   . GERD (gastroesophageal reflux disease)     with pregnancy  . Anemia   . Depression   . PTSD (post-traumatic stress disorder)   . Dissociative identity disorder    Past Surgical History  Procedure Laterality Date  . Breast surgery  x 2    breast reduction and lumpectomy  . Cesarean section N/A 05/10/2014    Procedure: CESAREAN SECTION;  Surgeon: Freddrick March. Tenny Craw, MD;  Location: [facility];  Service: Obstetrics;  Laterality: N/A;  . Cesarean section N/A 06/05/2015    Procedure: CESAREAN SECTION;  Surgeon: Waynard Reeds, MD;  Location: [facility];  Service: Obstetrics;  Laterality: N/A;  . Cholecystectomy N/A 06/29/2015    Procedure: LAPAROSCOPIC CHOLECYSTECTOMY WITH INTRAOPERATIVE CHOLANGIOGRAM;  Surgeon: Gaynelle Adu, MD;   [facility];  Service: General;  Laterality: N/A;   Family History  Problem Relation Age of Onset  . Bipolar disorder Mother   . Cancer Father   . Sickle cell anemia Brother   . Breast cancer Maternal Grandmother   . Diabetes Maternal Grandmother   . Diabetes Maternal Grandfather   . Asthma Mother   . Asthma Sister   . Asthma      nephew  . COPD Maternal Aunt    Social History  Substance Use Topics  . Smoking status: Former Smoker -- 1.00 packs/day for 8 years    Types: Cigarettes    Quit date: 09/09/2013  . Smokeless tobacco: Never Used  . Alcohol Use: No   OB History    Gravida Para Term Preterm AB TAB SAB Ectopic Multiple Living   2 2 2       0 2     Review of Systems ROS reviewed and all are negative for acute change except as noted in the HPI.  Allergies  Nsaids and Soybean-containing drug products  Home Medications   Prior to Admission medications   Medication Sig Start Date End Date Taking? Authorizing Provider  acetaminophen (TYLENOL) 325 MG tablet Take 2 tablets (650 mg total) by mouth every 6 (six) hours as needed (You can only take 4000 mg of acetaminophen per day (Tylenol)  it is in your pain medicine so count the amout you take each day.  Don't go over 4000 mg.). 06/30/15   Sherrie George, PA-C  albuterol (  PROVENTIL HFA;VENTOLIN HFA) 108 (90 BASE) MCG/ACT inhaler Inhale 2 puffs into the lungs every 6 (six) hours as needed for wheezing.    Historical Provider, MD  albuterol (PROVENTIL) (2.5 MG/3ML) 0.083% nebulizer solution Take 2.5 mg by nebulization every 6 (six) hours as needed for wheezing or shortness of breath.    Historical Provider, MD  budesonide-formoterol (SYMBICORT) 80-4.5 MCG/ACT inhaler Inhale 2 puffs into the lungs daily.    Historical Provider, MD  cetirizine (ZYRTEC) 10 MG tablet Take 10 mg by mouth daily.    Historical Provider, MD  ferrous sulfate 325 (65 FE) MG tablet Take 325 mg by mouth daily with breakfast.    Historical Provider, MD   [facility]35 [facility]-MCG tablet Take 1 tablet by mouth daily.  08/03/15   Historical Provider, MD  omeprazole (PRILOSEC) 20 MG capsule Take 20 mg by mouth daily.  08/12/15   Historical Provider, MD  ondansetron (ZOFRAN ODT) 8 MG disintegrating tablet 8mg  ODT q4 hours prn nausea 08/17/15   Geoffery Lyonsouglas Delo, MD  PHENTERMINE HCL PO Take by mouth.    Historical Provider, MD  Prenatal Vit-Fe Fumarate-FA (PRENATAL MULTIVITAMIN) TABS tablet Take 1 tablet by mouth daily at 12 noon.    Historical Provider, MD  sertraline (ZOLOFT) 25 MG tablet Take 1 pill daily for 7 days then increase to 2 pills daily Patient taking differently: Take 50 mg by mouth daily.  06/07/15   Waynard ReedsKendra Ross, MD  topiramate (TOPAMAX) 25 MG tablet Take 25 mg by mouth daily.  08/14/15   Historical Provider, MD   BP 102/71 mmHg  Pulse 83  Temp(Src) 97.9 F (36.6 C) (Oral)  Resp 18  Ht 5\' 4"  (1.626 m)  Wt 128.368 kg  BMI 48.55 kg/m2  SpO2 99%  LMP 11/24/2015   Physical Exam  Constitutional: She is oriented to person, place, and time. She appears well-developed and well-nourished.  HENT:  Head: Normocephalic and atraumatic.  Eyes: EOM are normal. Pupils are equal, round, and reactive to light.  Neck: Normal range of motion. Neck supple.  Cardiovascular: Normal rate, regular rhythm, normal heart sounds and intact distal pulses.   No murmur heard. Pulmonary/Chest: Effort normal and breath sounds normal. No respiratory distress. She has no wheezes. She has no rales. She exhibits tenderness.  TTP along sternum and left anterior chest. Pain reproducible on palpation.   Abdominal: Soft. Normal appearance and bowel sounds are normal. There is no tenderness. There is no rebound, no CVA tenderness, no tenderness at McBurney's point and negative Murphy's sign.  Musculoskeletal: Normal range of motion.  Neurological: She is alert and oriented to person, place, and time.  Skin: Skin is warm and dry.  Psychiatric: She has a normal mood and  affect. Her behavior is normal. Thought content normal.  Nursing note and vitals reviewed.  ED Course  Procedures (including critical care time) Labs Review Labs Reviewed  BASIC METABOLIC PANEL - Abnormal; Notable for the following:    Potassium 2.9 (*)    Chloride 114 (*)    CO2 19 (*)    Glucose, Bld 115 (*)    All other components within normal limits  CBC - Abnormal; Notable for the following:    Hemoglobin 11.5 (*)    HCT 34.8 (*)    All other components within normal limits  CBG MONITORING, ED - Abnormal; Notable for the following:    Glucose-Capillary 121 (*)    All other components within normal limits  I-STAT BETA HCG BLOOD, ED ([facility], [facility],  [facility] ONLY)  Rosezena Sensor, ED   Imaging Review Dg Chest 2 View  12/16/2015  CLINICAL DATA:  Chest pain EXAM: CHEST  2 VIEW COMPARISON:  12/08/2015 FINDINGS: Normal heart size and mediastinal contours. No acute infiltrate or edema. No effusion or pneumothorax. No acute osseous findings. IMPRESSION: Negative chest Electronically Signed   By: Marnee Spring M.D.   On: 12/16/2015 22:36   I have personally reviewed and evaluated these images and lab results as part of my medical decision-making.   EKG Interpretation   Date/Time:  Tuesday December 16 2015 21:33:43 EDT Ventricular Rate:  93 PR Interval:    QRS Duration: 97 QT Interval:  341 QTC Calculation: 425 R Axis:   71 Text Interpretation:  Sinus rhythm Borderline T abnormalities, diffuse  leads t wave changes are new since last tracing Confirmed by KNAPP  MD-J,  JON ([id number]) on 12/16/2015 9:41:24 PM      MDM  I have reviewed and evaluated the relevant laboratory valuesI have reviewed and evaluated the relevant imaging studies. I have interpreted the relevant EKG.I have reviewed the relevant previous healthcare records.I obtained HPI from historian. Patient discussed with supervising physician  ED Course:  Assessment: Pt is a 30yF presents with CP x 2 weeks. Constant. Risk Factors  none. Seen on 12/09/15 for similar. CT Angio done and negative. ACS workup unremarkable. Patient is to be discharged with recommendation to follow up with Cardiology at scheduled appointment in regards to today's hospital visit. Chest pain is not likely of cardiac or pulmonary etiology d/t presentation, VSS, no tracheal deviation, no JVD or new murmur, RRR, breath sounds equal bilaterally, EKG without acute abnormalities, negative troponin, and negative CXR. BMP with Hypokalemia at 2.9. Treated with KDur. Low risk for ACS. Pt has been advised to return to the ED is CP becomes exertional, associated with diaphoresis or nausea, radiates to left jaw/arm, worsens or becomes concerning in any way. Pt appears reliable for follow up and is agreeable to discharge. Will treat for possible pericarditis with Colchicine. Pt allergy to NSAIDs with anaphylaxis. Given Oxycodone #5. Reviewed [place] drug database. Patient is in no acute distress. Vital Signs are stable. Patient is able to ambulate. Patient able to tolerate PO.   Disposition/Plan:  DC Home Additional Verbal discharge instructions given and discussed with patient.  Pt Instructed to f/u with Cardiology at scheduled appointment for evaluation and treatment of symptoms. Return precautions given Pt acknowledges and agrees with plan  Supervising Physician Linwood Dibbles, MD   Final diagnoses:  Chest pain, unspecified chest pain type  Hypokalemia    Audry Pili, PA-C 12/16/15 0981  Linwood Dibbles, MD 12/17/15 1556

## 2015-12-16 NOTE — Discharge Instructions (Signed)
Please read and follow all provided instructions.  Your diagnoses today include:  1. Chest pain, unspecified chest pain type    Tests performed today include:  An EKG of your heart  A chest x-ray  Cardiac enzymes - a blood test for heart muscle damage  Blood counts and electrolytes  Vital signs. See below for your results today.   Medications prescribed:   Take any prescribed medications only as directed.  Follow-up instructions: Please follow-up with your primary care provider as soon as you can for further evaluation of your symptoms.   Return instructions:  SEEK IMMEDIATE MEDICAL ATTENTION IF:  You have severe chest pain, especially if the pain is crushing or pressure-like and spreads to the arms, back, neck, or jaw, or if you have sweating, nausea (feeling sick to your stomach), or shortness of breath. THIS IS AN EMERGENCY. Don't wait to see if the pain will go away. Get medical help at once. Call 911 or 0 (operator). DO NOT drive yourself to the hospital.   Your chest pain gets worse and does not go away with rest.   You have an attack of chest pain lasting longer than usual, despite rest and treatment with the medications your caregiver has prescribed.   You wake from sleep with chest pain or shortness of breath.  You feel dizzy or faint.  You have chest pain not typical of your usual pain for which you originally saw your caregiver.   You have any other emergent concerns regarding your health.  Additional Information: Chest pain comes from many different causes. Your caregiver has diagnosed you as having chest pain that is not specific for one problem, but does not require admission.  You are at low risk for an acute heart condition or other serious illness.   Your vital signs today were: BP 118/68 mmHg   Pulse 97   Temp(Src) 97.9 F (36.6 C) (Oral)   Resp 13   Ht 5\' 4"  (1.626 m)   Wt 128.368 kg   BMI 48.55 kg/m2   SpO2 100%   LMP 11/24/2015 If your blood  pressure (BP) was elevated above 135/85 this visit, please have this repeated by your doctor within one month. --------------

## 2015-12-16 NOTE — ED Notes (Signed)
Pt in EMS from home after near syncopal episode. Pt has had SOB X2 weeks. Has seen PCP for same reason, referred to cardiologist and pulmonologist. Also reports CP 7/10. Given 125 Solumedrol, 10 albuterol, 0.5 atrovent, 1NTG.

## 2015-12-16 NOTE — ED Notes (Signed)
CBG= 121

## 2015-12-17 NOTE — ED Notes (Signed)
Pt dc home with significant other. Reviewed dc instructions and prescriptions with pt. Pt understands plan of care. Pt 99% on room air at this time.

## 2015-12-24 ENCOUNTER — Ambulatory Visit (INDEPENDENT_AMBULATORY_CARE_PROVIDER_SITE_OTHER): Payer: Medicare Other | Admitting: Internal Medicine

## 2015-12-24 ENCOUNTER — Encounter: Payer: Self-pay | Admitting: Internal Medicine

## 2015-12-24 VITALS — BP 128/70 | HR 94 | Ht 64.0 in | Wt 282.0 lb

## 2015-12-24 DIAGNOSIS — R0689 Other abnormalities of breathing: Secondary | ICD-10-CM

## 2015-12-24 DIAGNOSIS — R059 Cough, unspecified: Secondary | ICD-10-CM

## 2015-12-24 DIAGNOSIS — R42 Dizziness and giddiness: Secondary | ICD-10-CM

## 2015-12-24 DIAGNOSIS — R0600 Dyspnea, unspecified: Secondary | ICD-10-CM | POA: Insufficient documentation

## 2015-12-24 DIAGNOSIS — R05 Cough: Secondary | ICD-10-CM | POA: Diagnosis not present

## 2015-12-24 NOTE — Addendum Note (Signed)
Addended by: Velvet Bathe on: 12/24/2015 02:58 PM   Modules accepted: Orders

## 2015-12-24 NOTE — Progress Notes (Signed)
Subjective:    Patient ID: Shannon Aguirre, female    DOB: 1986/04/02, 30 y.o.   MRN: [id number]   PCP Jackie Plum, MD  HPI   IOV 12/24/2015  Chief Complaint  Patient presents with  . Advice Only    referred by Dr. Cooper Render for asthma and chest pains.      30 year old [place] immigrant. She lives in the local area. She is seeing a primary care physician since 2013. She's been on Symbicort for many years even prior to that for history of asthma. In the last 4 years at least since the time she's been seeing a primary care physician she has not been on prednisone for asthma. She has not had any emergency room visits. Her asthma gets triggered by seasonal changes and pollen. Symptoms of wheezing and chest tightness. However few years ago after starting Zyrtec she's had a stable state. Her albuterol use in the last few years has been extremely rare using it only every few months.  Against the above background 18 months ago she had a first child by cesarean section. Then approximately 6 months ago she had a second troponin but cesarean section. Denies any postpartum cardiomyopathy symptoms. Then starting starting early July 2017 she started having insidious onset of chest pains, associated with costochondral left-sided pain, radiation of the pain to the left arm and the back, nonspecific dizziness and for the last 1 week cough. The cough is severe. It is dry. Present day and night. It is improved with albuterol. She has been on phentermine since May 2017 for weight loss. In addition she's been on contraceptive pills is May 2017  CT angiogram chest 12/09/2015: Personally visualized the lung fields are clear. No evidence of pulmonary embolism according to the report.  Lab review EKG 12/19/2015: Sinus tachycardia 94 sinus rhythm without any acute changes  Other labs: Troponin negative 12/09/2015 and 12/16/2015  Chemistry test 12/08/2015 creatinine 0.8 mg percent. And also normal  12/16/2015. Normal CBCs 12/16/2015  Walking desaturation test 185 feet 3 laps on room air 12/24/2015: Walked only half lap. She stopped 3 times. She has resting sinus tachycardia 112. Heart rate rose to 117/min and she stopped. Pulse ox did not drop below 100% both at rest and with exertion.  Exhaled nitric oxide today in the office 12/24/2015: Is normal at 20   Asthma Control Panel 12/09/15 - eos 200cells/cumm 12/24/2015   Current Med Regimen symbicort  ACQ 5 point- 1 week. wtd avg score. <1.0 is good control 0.75-1.25 is grey zone. >1.25 poor control. Delta 0.5 is clinically meaningful 4.4  ACQ 7 point - 1 week. wtd avg score. <1.0 is good control 0.75-1.25 is grey zone. >1.25 poor control. Delta 0.5 is clinically meaningful x  ACT - a [employer] test - 4 week. Total score. Max is 25, Lower score is worse.  <19 = poor control x  FeNO ppB 20 ppb  FeV1  x  Planned intervention  for visit       has a past medical history of Allergy; Anemia; Asthma; Depression; Dissociative identity disorder; GERD (gastroesophageal reflux disease); Headache; and PTSD (post-traumatic stress disorder).   reports that she quit smoking about 2 years ago. Her smoking use included Cigarettes. She has a 8.00 pack-year smoking history. She has never used smokeless tobacco.  Past Surgical History:  Procedure Laterality Date  . BREAST SURGERY  x 2   breast reduction and lumpectomy  . CESAREAN SECTION N/A 05/10/2014   Procedure: CESAREAN SECTION;  Surgeon: Freddrick March. Tenny Craw, MD;  Location: [facility];  Service: Obstetrics;  Laterality: N/A;  . CESAREAN SECTION N/A 06/05/2015   Procedure: CESAREAN SECTION;  Surgeon: Waynard Reeds, MD;  Location: [facility];  Service: Obstetrics;  Laterality: N/A;  . CHOLECYSTECTOMY N/A 06/29/2015   Procedure: LAPAROSCOPIC CHOLECYSTECTOMY WITH INTRAOPERATIVE CHOLANGIOGRAM;  Surgeon: Gaynelle Adu, MD;  Location: [facility] OR;  Service: General;  Laterality: N/A;    Allergies  Allergen Reactions  . Nsaids  Anaphylaxis  . Soybean-Containing Drug Products Swelling    Swelling of throat and lips    Immunization History  Administered Date(s) Administered  . Influenza,inj,Quad PF,36+ Mos 05/12/2014  . Tdap 05/11/2014    Family History  Problem Relation Age of Onset  . Bipolar disorder Mother   . Asthma Mother   . Cancer Father   . Sickle cell anemia Brother   . Breast cancer Maternal Grandmother   . Diabetes Maternal Grandmother   . Diabetes Maternal Grandfather   . Asthma Sister   . Asthma      nephew  . COPD Maternal Aunt      Current Outpatient Prescriptions:  .  acetaminophen (TYLENOL) 325 MG tablet, Take 2 tablets (650 mg total) by mouth every 6 (six) hours as needed (You can only take 4000 mg of acetaminophen per day (Tylenol)  it is in your pain medicine so count the amout you take each day.  Don't go over 4000 mg.)., Disp: , Rfl:  .  albuterol (PROVENTIL HFA;VENTOLIN HFA) 108 (90 BASE) MCG/ACT inhaler, Inhale 2 puffs into the lungs every 6 (six) hours as needed for wheezing., Disp: , Rfl:  .  albuterol (PROVENTIL) (2.5 MG/3ML) 0.083% nebulizer solution, Take 2.5 mg by nebulization every 6 (six) hours as needed for wheezing or shortness of breath., Disp: , Rfl:  .  budesonide-formoterol (SYMBICORT) 80-4.5 MCG/ACT inhaler, Inhale 2 puffs into the lungs daily., Disp: , Rfl:  .  cetirizine (ZYRTEC) 10 MG tablet, Take 10 mg by mouth daily., Disp: , Rfl:  .  colchicine 0.6 MG tablet, Take 1 tablet (0.6 mg total) by mouth daily., Disp: 20 tablet, Rfl: 0 .  omeprazole (PRILOSEC) 20 MG capsule, Take 20 mg by mouth daily. , Disp: , Rfl:  .  PHENTERMINE HCL PO, Take by mouth., Disp: , Rfl:  .  topiramate (TOPAMAX) 25 MG tablet, Take 25 mg by mouth daily. , Disp: , Rfl:    Review of Systems  Constitutional: Negative for fever and unexpected weight change.  HENT: Negative for congestion, dental problem, ear pain, nosebleeds, postnasal drip, rhinorrhea, sinus pressure, sneezing, sore  throat and trouble swallowing.   Eyes: Negative for redness and itching.  Respiratory: Positive for shortness of breath. Negative for cough, chest tightness and wheezing.   Cardiovascular: Positive for chest pain. Negative for palpitations and leg swelling.  Gastrointestinal: Negative for nausea and vomiting.  Genitourinary: Negative for dysuria.  Musculoskeletal: Negative for joint swelling.  Skin: Negative for rash.  Neurological: Negative for headaches.  Hematological: Does not bruise/bleed easily.  Psychiatric/Behavioral: Negative for dysphoric mood. The patient is not nervous/anxious.        Objective:   Physical Exam  Constitutional: She is oriented to person, place, and time. She appears well-developed and well-nourished. No distress.  Obese female  HENT:  Head: Normocephalic and atraumatic.  Right Ear: External ear normal.  Left Ear: External ear normal.  Mouth/Throat: Oropharynx is clear and moist. No oropharyngeal exudate.  Eyes: Conjunctivae and EOM are normal. Pupils are  equal, round, and reactive to light. Right eye exhibits no discharge. Left eye exhibits no discharge. No scleral icterus.  Neck: Normal range of motion. Neck supple. No JVD present. No tracheal deviation present. No thyromegaly present.  Cardiovascular: Normal rate, regular rhythm, normal heart sounds and intact distal pulses.  Exam reveals no gallop and no friction rub.   No murmur heard. Pulmonary/Chest: Effort normal and breath sounds normal. No respiratory distress. She has no wheezes. She has no rales. She exhibits no tenderness.  Periodically coughs No postnasal drip  Abdominal: Soft. Bowel sounds are normal. She exhibits no distension and no mass. There is no tenderness. There is no rebound and no guarding.  Musculoskeletal: Normal range of motion. She exhibits no edema or tenderness.  Lymphadenopathy:    She has no cervical adenopathy.  Neurological: She is alert and oriented to person, place,  and time. She has normal reflexes. No cranial nerve deficit. She exhibits normal muscle tone. Coordination normal.  Skin: Skin is warm and dry. No rash noted. She is not diaphoretic. No erythema. No pallor.  Psychiatric:  Anxious affect  Vitals reviewed.   Vitals:   12/24/15 1135  BP: 128/70  Pulse: 94  SpO2: 99%  Weight: 282 lb (127.9 kg)  Height: 5\' 4"  (1.626 m)    Estimated body mass index is 48.41 kg/m as calculated from the following:   Height as of this encounter: 5\' 4"  (1.626 m).   Weight as of this encounter: 282 lb (127.9 kg).       Assessment & Plan:     ICD-9-CM ICD-10-CM   1. Cough 786.2 R05 ECHOCARDIOGRAM COMPLETE  2. Dyspnea and respiratory abnormality 786.09 R06.00 ECHOCARDIOGRAM COMPLETE    R06.89   3. Dizziness and giddiness 780.4 R42 ECHOCARDIOGRAM COMPLETE    Unusual constellation of symptoms. No evidence of this being asthma given normal antioxidant medications on history feeling this is not asthma. No evidence of interstitial lung disease of pulmonary embolism or pneumonia. I am hard pressed to attribute any of the symptoms to lung disease. At this point in time given obesity and recent pregnancies she is at risk for postpartum cardiomyopathy. Therefore we will get an echo as soon as possible to rule this out. In addition given her age and the fact she is on phentermine she is at risk for pulmonary hypertension. Again echo will be helpful. If the echo is normal then we will have to proceed getting short course of empiric prednisone and reassessment. Last etiology would be irritable larynx syndrome or vocal cord dysfunction   Dr. Kalman Shan, M.D., [facility]C.P Pulmonary and Critical Care Medicine Staff Physician [facility] Pager: [phone], If no answer or between  15:00h - 7:00h: call [phone]  12/24/2015 12:42 PM

## 2015-12-24 NOTE — Patient Instructions (Addendum)
ICD-9-CM ICD-10-CM   1. Cough 786.2 R05   2. Dyspnea and respiratory abnormality 786.09 R06.00     R06.89   3. Dizziness and giddiness 780.4 R42     Unclear what is going on Do 2D echo - this week - will call with results If echo is normal , wil do short course empiric prednissone

## 2015-12-31 ENCOUNTER — Ambulatory Visit (HOSPITAL_COMMUNITY)
Admission: RE | Admit: 2015-12-31 | Discharge: 2015-12-31 | Disposition: A | Discharge status: Home / Self Care | Payer: Medicare Other | Source: Ambulatory Visit | Attending: Internal Medicine | Admitting: Internal Medicine

## 2015-12-31 DIAGNOSIS — R0600 Dyspnea, unspecified: Secondary | ICD-10-CM | POA: Diagnosis present

## 2015-12-31 DIAGNOSIS — R0689 Other abnormalities of breathing: Secondary | ICD-10-CM | POA: Diagnosis not present

## 2015-12-31 NOTE — Progress Notes (Signed)
Echocardiogram 2D Echocardiogram has been performed.  Shannon Aguirre 12/31/2015, 11:49 AM

## 2016-01-01 ENCOUNTER — Telehealth: Payer: Self-pay | Admitting: Internal Medicine

## 2016-01-01 DIAGNOSIS — R0600 Dyspnea, unspecified: Secondary | ICD-10-CM

## 2016-01-01 DIAGNOSIS — R0689 Other abnormalities of breathing: Principal | ICD-10-CM

## 2016-01-01 NOTE — Telephone Encounter (Signed)
Spoke with the pt  She is calling for ECHO results and also asking when MR wants to see her back  She is aware MR not back until 8/4  Please advise, thanks!

## 2016-01-02 ENCOUNTER — Ambulatory Visit (INDEPENDENT_AMBULATORY_CARE_PROVIDER_SITE_OTHER): Payer: Medicare Other | Admitting: Pulmonary Disease

## 2016-01-02 ENCOUNTER — Encounter: Payer: Self-pay | Admitting: Pulmonary Disease

## 2016-01-02 DIAGNOSIS — J4531 Mild persistent asthma with (acute) exacerbation: Secondary | ICD-10-CM | POA: Diagnosis not present

## 2016-01-02 NOTE — Assessment & Plan Note (Signed)
Her symptoms are atypical and I doubt that the represent asthma exacerbation Other possibilities have been ruled out-with her extensive testing so far. Most likely culprit is phentermin, she does seem to have significant stressors and anxiety/panic attacks could be contributing   Stop taking phentermine If symptoms persist after 2 weeks, discuss with your therapist about anxiety and behavioral techniques for anxiety  Keep your stress test and cardiology appointment

## 2016-01-02 NOTE — Telephone Encounter (Signed)
Spoke with the pt and notified of recs per MR  She verbalized understanding  She states that last she woke up with SOB today and feels that this is only getting worse  OV with RA at 11:30 am today

## 2016-01-02 NOTE — Telephone Encounter (Signed)
Echo is normal which rules out post partum heart failure. This is good news but symptoms remain unexplained. Please have her [place] with Shannon Aguirre and return to see me or Tammy PArrett 1 week after test to discuss rsults

## 2016-01-02 NOTE — Progress Notes (Signed)
   Subjective:    Patient ID: Shannon Aguirre, female    DOB: [birth year], 30 y.o.   MRN: [id number]  HPI  30 year old [place] Woman with recurrent episodes of chest pain and dyspnea since 11/2015.  She was seen by my partner Dr. Marchelle Gearing and set up for an echo to rule out postpartum cardiomyopathy, this was normal and did not show any evidence of pulmonary hypertension  She has 2 kids aged 49 and 7 months. She admits to a lot of stressors. She has been on phentermine for weight loss since May 2017 and Topamax for migraines She has been on counseling and sees her therapist every week for issues with depression and bipolar  She had recurrent ER visits and has a cardiology follow-up scheduled She called the office with chest pain symptoms and an acute visit was set up today   Albuterol nebs seem to relieve for a few hours. He reports substernal chest pain radiating to her arms and shooting down her back with tingling in her hands. This is accompanied by a sensation of dyspnea  She has asthma since childhood and this does not seem like an asthma attack     Significant tests/ events  CT angiogram chest 12/09/2015: Personally visualized the lung fields are clear. No evidence of pulmonary embolism according to the report.  Lab review EKG 12/19/2015: Sinus tachycardia 94 sinus rhythm without any acute changes   Walking desaturation test 185 feet 3 laps on room air 12/24/2015: Walked only half lap. She stopped 3 times. She has resting sinus tachycardia 112. Heart rate rose to 117/min and she stopped. Pulse ox did not drop below 100% both at rest and with exertion.  Exhaled nitric oxide today in the office 12/24/2015: Is normal at 20   Review of Systems  neg for any significant sore throat, dysphagia, itching, sneezing, nasal congestion or excess/ purulent secretions, fever, chills, sweats, unintended wt loss, pleuritic or exertional cp, hempoptysis, orthopnea pnd or change in  chronic leg swelling.   Also denies presyncope, palpitations, heartburn, abdominal pain, nausea, vomiting, diarrhea or change in bowel or urinary habits, dysuria,hematuria, rash, arthralgias, visual complaints, headache, numbness weakness or ataxia.     Objective:   Physical Exam  Gen. Pleasant, obese, in no distress ENT - no lesions, no post nasal drip Neck: No JVD, no thyromegaly, no carotid bruits Lungs: no use of accessory muscles, no dullness to percussion, decreased without rales or rhonchi  Cardiovascular: Rhythm regular, heart sounds  normal, no murmurs or gallops, no peripheral edema Musculoskeletal: No deformities, no cyanosis or clubbing , no tremors       Assessment & Plan:

## 2016-01-02 NOTE — Patient Instructions (Signed)
Stop taking phentermine If symptoms persist after 2 weeks, discuss with your therapist about anxiety and behavioral techniques for anxiety  Keep your stress test and cardiology appointment

## 2016-01-05 ENCOUNTER — Other Ambulatory Visit (HOSPITAL_COMMUNITY): Payer: Medicare Other

## 2016-01-06 ENCOUNTER — Ambulatory Visit (HOSPITAL_COMMUNITY): Payer: Medicare Other | Attending: Internal Medicine

## 2016-01-06 DIAGNOSIS — R0600 Dyspnea, unspecified: Secondary | ICD-10-CM | POA: Diagnosis present

## 2016-01-06 DIAGNOSIS — R0689 Other abnormalities of breathing: Secondary | ICD-10-CM

## 2016-01-09 ENCOUNTER — Telehealth: Payer: Self-pay | Admitting: Internal Medicine

## 2016-01-09 NOTE — Telephone Encounter (Signed)
Called and spoke with pt and she is wanting results of the stress test done.  She is aware of MR having to review and we will call her once we hear back.  MR please advise. thanks

## 2016-01-12 NOTE — Telephone Encounter (Signed)
Cardiopulmonary stress test results are preliminary for [employer] . It has not yet been formalized by DR Mannam. However it appears that her weight is contributing to her shortness of breath. Please have him come in first available which could be 01/15/2016 Thursday morning to discuss  the results with me   Dr. Kalman ShanMurali Amandajo Gonder, M.D., [facility] Pulmonary and Critical Care Medicine Staff Physician  System [place] [facility] Pager: [phone], If no answer or between  15:00h - 7:00h: call [phone]  01/12/2016 4:37 PM

## 2016-01-12 NOTE — Telephone Encounter (Signed)
MR please advise thanks  

## 2016-01-12 NOTE — Telephone Encounter (Signed)
Called spoke with pt. appt scheduled for Friday. nothing further needed

## 2016-01-15 ENCOUNTER — Encounter: Payer: Self-pay | Admitting: Internal Medicine

## 2016-01-15 ENCOUNTER — Ambulatory Visit (INDEPENDENT_AMBULATORY_CARE_PROVIDER_SITE_OTHER): Payer: Medicare Other | Admitting: Internal Medicine

## 2016-01-15 VITALS — BP 110/68 | HR 75 | Ht 65.0 in | Wt 282.0 lb

## 2016-01-15 DIAGNOSIS — R0789 Other chest pain: Secondary | ICD-10-CM | POA: Diagnosis not present

## 2016-01-15 DIAGNOSIS — R0689 Other abnormalities of breathing: Secondary | ICD-10-CM | POA: Diagnosis not present

## 2016-01-15 DIAGNOSIS — R0600 Dyspnea, unspecified: Secondary | ICD-10-CM

## 2016-01-15 NOTE — Progress Notes (Signed)
Subjective:     Patient ID: Shannon Aguirre, female   DOB: [birth year], 29 y.o.   MRN: [id number]  HPI     PCP OSEI-BONSU,GEORGE, MD  HPI   IOV 12/24/2015  Chief Complaint  Patient presents with  . Advice Only    referred by Dr. Cooper Render for asthma and chest pains.      30 year old [place] immigrant. She lives in the local area. She is seeing a primary care physician since 2013. She's been on Symbicort for many years even prior to that for history of asthma. In the last 4 years at least since the time she's been seeing a primary care physician she has not been on prednisone for asthma. She has not had any emergency room visits. Her asthma gets triggered by seasonal changes and pollen. Symptoms of wheezing and chest tightness. However few years ago after starting Zyrtec she's had a stable state. Her albuterol use in the last few years has been extremely rare using it only every few months.  Against the above background 18 months ago she had a first child by cesarean section. Then approximately 6 months ago she had a second troponin but cesarean section. Denies any postpartum cardiomyopathy symptoms. Then starting starting early July 2017 she started having insidious onset of chest pains, associated with costochondral left-sided pain, radiation of the pain to the left arm and the back, nonspecific dizziness and for the last 1 week cough. The cough is severe. It is dry. Present day and night. It is improved with albuterol. She has been on phentermine since May 2017 for weight loss. In addition she's been on contraceptive pills is May 2017  CT angiogram chest 12/09/2015: Personally visualized the lung fields are clear. No evidence of pulmonary embolism according to the report.  Lab review EKG 12/19/2015: Sinus tachycardia 94 sinus rhythm without any acute changes  Other labs: Troponin negative 12/09/2015 and 12/16/2015  Chemistry test 12/08/2015 creatinine 0.8 mg percent. And also normal  12/16/2015. Normal CBCs 12/16/2015  Walking desaturation test 185 feet 3 laps on room air 12/24/2015: Walked only half lap. She stopped 3 times. She has resting sinus tachycardia 112. Heart rate rose to 117/min and she stopped. Pulse ox did not drop below 100% both at rest and with exertion.  Exhaled nitric oxide today in the office 12/24/2015: Is normal at 20   Asthma Control Panel 12/09/15 - eos 200cells/cumm 12/24/2015   Current Med Regimen symbicort  ACQ 5 point- 1 week. wtd avg score. <1.0 is good control 0.75-1.25 is grey zone. >1.25 poor control. Delta 0.5 is clinically meaningful 4.4  ACQ 7 point - 1 week. wtd avg score. <1.0 is good control 0.75-1.25 is grey zone. >1.25 poor control. Delta 0.5 is clinically meaningful x  ACT - a [employer] test - 4 week. Total score. Max is 25, Lower score is worse.  <19 = poor control x  FeNO ppB 20 ppb  FeV1  x  Planned intervention  for visit      01/02/2016 seen by Dr. Vassie Loll 30 year old [place] Woman with recurrent episodes of chest pain and dyspnea since 11/2015.  She was seen by my partner Dr. Marchelle Gearing and set up for an echo to rule out postpartum cardiomyopathy, this was normal and did not show any evidence of pulmonary hypertension  She has 2 kids aged 40 and 7 months. She admits to a lot of stressors. She has been on phentermine for weight loss since May 2017 and Topamax for migraines  She has been on counseling and sees her therapist every week for issues with depression and bipolar  She had recurrent ER visits and has a cardiology follow-up scheduled She called the office with chest pain symptoms and an acute visit was set up today   Albuterol nebs seem to relieve for a few hours. He reports substernal chest pain radiating to her arms and shooting down her back with tingling in her hands. This is accompanied by a sensation of dyspnea  She has asthma since childhood and this does not seem like an asthma attack   OV 01/15/2016  Chief  Complaint  Patient presents with  . Follow-up    Pt here after CPST. Pt states her breathing has worsened since last OV. Pt states she was recently diagnosed with orthostatic hypotension by cards. Pt states she only coughs when she is SOB. Pt states she has midsternal chest pain daily.    Follow up to discuss pulmonary status was some 01/07/2016. In the interim she had echocardiogram 12/31/2015 that is reported normal. Pulmonary stress test 01/07/2016 shows 75% VO2 max with a normal respiratory exchange ratio. This corrects with ideal body weight Which suggests obesity as a cause of dyspnea. It was noted that she was actively wheezing but it is also reported that she had normal ventilatory reserve. Her resting spirometry was normal. There is some flattening at peak exercise of her O2 pulse curve. Flow volume curve in the resting spirometry was normal but patient could not do post exercise spirometry      has a past medical history of Allergy; Anemia; Asthma; Depression; Dissociative identity disorder; GERD (gastroesophageal reflux disease); Headache; and PTSD (post-traumatic stress disorder).   reports that she quit smoking about 2 years ago. Her smoking use included Cigarettes. She has a 8.00 pack-year smoking history. She has never used smokeless tobacco.  Past Surgical History:  Procedure Laterality Date  . BREAST SURGERY  x 2   breast reduction and lumpectomy  . CESAREAN SECTION N/A 05/10/2014   Procedure: CESAREAN SECTION;  Surgeon: Freddrick MarchKendra H. Tenny Crawoss, MD;  Location: [facility];  Service: Obstetrics;  Laterality: N/A;  . CESAREAN SECTION N/A 06/05/2015   Procedure: CESAREAN SECTION;  Surgeon: Waynard ReedsKendra Ross, MD;  Location: [facility];  Service: Obstetrics;  Laterality: N/A;  . CHOLECYSTECTOMY N/A 06/29/2015   Procedure: LAPAROSCOPIC CHOLECYSTECTOMY WITH INTRAOPERATIVE CHOLANGIOGRAM;  Surgeon: Gaynelle AduEric Wilson, MD;  [facility];  Service: General;  Laterality: N/A;    Allergies  Allergen Reactions  .  Nsaids Anaphylaxis  . Soybean-Containing Drug Products Swelling    Swelling of throat and lips    Immunization History  Administered Date(s) Administered  . Influenza,inj,Quad PF,36+ Mos 05/12/2014  . Tdap 05/11/2014    Family History  Problem Relation Age of Onset  . Bipolar disorder Mother   . Asthma Mother   . Cancer Father   . Sickle cell anemia Brother   . Breast cancer Maternal Grandmother   . Diabetes Maternal Grandmother   . Diabetes Maternal Grandfather   . Asthma Sister   . Asthma      nephew  . COPD Maternal Aunt      Current Outpatient Prescriptions:  .  acetaminophen (TYLENOL) 325 MG tablet, Take 2 tablets (650 mg total) by mouth every 6 (six) hours as needed (You can only take 4000 mg of acetaminophen per day (Tylenol)  it is in your pain medicine so count the amout you take each day.  Don't go over 4000 mg.)., Disp: ,  Rfl:  .  albuterol (PROVENTIL HFA;VENTOLIN HFA) 108 (90 BASE) MCG/ACT inhaler, Inhale 2 puffs into the lungs every 6 (six) hours as needed for wheezing., Disp: , Rfl:  .  albuterol (PROVENTIL) (2.5 MG/3ML) 0.083% nebulizer solution, Take 2.5 mg by nebulization every 6 (six) hours as needed for wheezing or shortness of breath., Disp: , Rfl:  .  budesonide-formoterol (SYMBICORT) 80-4.5 MCG/ACT inhaler, Inhale 2 puffs into the lungs daily., Disp: , Rfl:  .  cetirizine (ZYRTEC) 10 MG tablet, Take 10 mg by mouth daily., Disp: , Rfl:  .  medroxyPROGESTERone (DEPO-PROVERA) 150 MG/ML injection, Inject 150 mg into the muscle every 3 (three) months., Disp: , Rfl:  .  midodrine (PROAMATINE) 10 MG tablet, Take 10 mg by mouth 3 (three) times daily., Disp: , Rfl:  .  omeprazole (PRILOSEC) 20 MG capsule, Take 20 mg by mouth daily. , Disp: , Rfl:  .  topiramate (TOPAMAX) 25 MG tablet, Take 25 mg by mouth daily. , Disp: , Rfl:       Review of Systems     Objective:   Physical Exam   Vitals:   01/15/16 1145  BP: 110/68  Pulse: 75  SpO2: 98%  Weight:  282 lb (127.9 kg)  Height: 5\' 5"  (1.651 m)   Discussion only visit    Assessment:       ICD-9-CM ICD-10-CM   1. Dyspnea and respiratory abnormality 786.09 R06.00     R06.89   2. Atypical chest pain 786.59 R07.89    We discussed obesity and possible vocal cord dysfunction based on the fact she is wheezing but normal spirometry and normal exhaled nitric oxide. She is now saying that atypical chest pains are her major problem. I explained to her about obesity is contributing to dyspnea but she rejected this finding. I discussed with her about possible vocal cord dysfunction and need for ENT evaluation and voice rehabilitation. She was more receptive to it. However she feels that this does not explain her chest pains. She is open to and prefers to get a second opinion. At this point in time she does not want to pursue any of my recommendations.    Plan:      Based on my interpretation of the pulmonary stress test I think you have vocal cord dysfunction and weight related symptoms.  Evaluation and treatment for this includes ENT evaluation and voice rehabilitation which I could refer you to   However I do respect your need to get a second opinion and we [facility]  Follow-up - As neededWill send. Flow-volume curve  (> 50% of this 15 min visit spent in face to face counseling or/and coordination of care)   Dr. Kalman ShanMurali Ceniya Fowers, M.D., [facility] Pulmonary and Critical Care Medicine Staff Physician [place] [facility] [place] [facility] Pager: [phone], If no answer or between  15:00h - 7:00h: call [phone]  01/15/2016 12:20 PM

## 2016-01-15 NOTE — Patient Instructions (Signed)
ICD-9-CM ICD-10-CM   1. Dyspnea and respiratory abnormality 786.09 R06.00     R06.89   2. Atypical chest pain 786.59 R07.89    Based on my interpretation of the pulmonary stress test I think you have vocal cord dysfunction and weight related symptoms.  Evaluation and treatment for this includes ENT evaluation and voice rehabilitation which I could refer you to   However I do respect your need to get a second opinion and we [facility]  Follow-up - As needed

## 2016-01-17 DIAGNOSIS — R0600 Dyspnea, unspecified: Secondary | ICD-10-CM | POA: Diagnosis not present

## 2016-03-01 ENCOUNTER — Emergency Department (HOSPITAL_COMMUNITY): Payer: No Typology Code available for payment source

## 2016-03-01 ENCOUNTER — Encounter (HOSPITAL_COMMUNITY): Payer: Self-pay

## 2016-03-01 ENCOUNTER — Emergency Department (HOSPITAL_COMMUNITY)
Admission: EM | Admit: 2016-03-01 | Discharge: 2016-03-01 | Disposition: A | Discharge status: Home / Self Care | Payer: No Typology Code available for payment source | Attending: Emergency Medicine | Admitting: Emergency Medicine

## 2016-03-01 DIAGNOSIS — Y92410 Unspecified street and highway as the place of occurrence of the external cause: Secondary | ICD-10-CM | POA: Insufficient documentation

## 2016-03-01 DIAGNOSIS — Z87891 Personal history of nicotine dependence: Secondary | ICD-10-CM | POA: Diagnosis not present

## 2016-03-01 DIAGNOSIS — M25562 Pain in left knee: Secondary | ICD-10-CM

## 2016-03-01 DIAGNOSIS — Y999 Unspecified external cause status: Secondary | ICD-10-CM | POA: Diagnosis not present

## 2016-03-01 DIAGNOSIS — R0789 Other chest pain: Secondary | ICD-10-CM | POA: Insufficient documentation

## 2016-03-01 DIAGNOSIS — M25561 Pain in right knee: Secondary | ICD-10-CM | POA: Diagnosis not present

## 2016-03-01 DIAGNOSIS — Y939 Activity, unspecified: Secondary | ICD-10-CM | POA: Insufficient documentation

## 2016-03-01 DIAGNOSIS — J45909 Unspecified asthma, uncomplicated: Secondary | ICD-10-CM | POA: Diagnosis not present

## 2016-03-01 DIAGNOSIS — R079 Chest pain, unspecified: Secondary | ICD-10-CM

## 2016-03-01 HISTORY — DX: Orthostatic hypotension: I95.1

## 2016-03-01 MED ORDER — OXYCODONE-ACETAMINOPHEN 5-325 MG PO TABS
1.0000 | ORAL_TABLET | Freq: Once | ORAL | Status: AC
  Administered 2016-03-01: 1 via ORAL
  Filled 2016-03-01: qty 1

## 2016-03-01 MED ORDER — ACETAMINOPHEN 325 MG PO TABS
650.0000 mg | ORAL_TABLET | Freq: Once | ORAL | Status: AC
  Administered 2016-03-01: 650 mg via ORAL
  Filled 2016-03-01: qty 2

## 2016-03-01 MED ORDER — HYDROCODONE-ACETAMINOPHEN 5-325 MG PO TABS: 1.0000 | ORAL_TABLET | Freq: Four times a day (QID) | ORAL | 0 refills | Status: DC | PRN
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
## 2016-04-19 ENCOUNTER — Encounter (HOSPITAL_COMMUNITY): Payer: Self-pay | Admitting: Emergency Medicine

## 2016-04-19 ENCOUNTER — Ambulatory Visit (HOSPITAL_COMMUNITY)
Admission: EM | Admit: 2016-04-19 | Discharge: 2016-04-19 | Disposition: A | Discharge status: Home / Self Care | Payer: Medicare Other | Attending: Emergency Medicine | Admitting: Emergency Medicine

## 2016-04-19 DIAGNOSIS — R197 Diarrhea, unspecified: Secondary | ICD-10-CM | POA: Diagnosis not present

## 2016-04-19 DIAGNOSIS — R112 Nausea with vomiting, unspecified: Secondary | ICD-10-CM | POA: Diagnosis not present

## 2016-04-19 MED ORDER — ONDANSETRON 4 MG PO TBDP
4.0000 mg | ORAL_TABLET | Freq: Once | ORAL | Status: AC
  Administered 2016-04-19: 4 mg via ORAL

## 2016-04-19 MED ORDER — ONDANSETRON HCL 4 MG PO TABS: 4.0000 mg | ORAL_TABLET | Freq: Four times a day (QID) | ORAL | 0 refills | Status: DC

## 2016-04-19 MED ORDER — ONDANSETRON 4 MG PO TBDP
ORAL_TABLET | ORAL | Status: AC
  Filled 2016-04-19: qty 1

## 2016-04-19 NOTE — ED Provider Notes (Signed)
CSN: [id number]     Arrival date & time 04/19/16  1340 History   First MD Initiated Contact with Patient 04/19/16 1527     Chief Complaint  Patient presents with  . Abdominal Pain   (Consider location/radiation/quality/duration/timing/severity/associated sxs/prior Treatment) HPI  Shannon Aguirre is a 30 y.o. female presenting to UC with c/o upper abdominal pain with n/v/d that started last night. She has had about 6 episodes of vomiting and 5-6 episodes of watery diarrhea since last night.  No blood or mucous in stool. Denies fever or chills. No cough or congestion. No urinary symptoms.  Pt notes she had to pick up her 63mo old son early from daycare today due to reports of 2 episodes of vomiting and one episode of diarrhea today.  No recent travel. Pt and her son have not eaten the same things.   Past Medical History:  Diagnosis Date  . Allergy   . Anemia   . Asthma   . Depression   . Dissociative identity disorder   . GERD (gastroesophageal reflux disease)    with pregnancy  . Headache   . Orthostatic hypotension   . PTSD (post-traumatic stress disorder)    Past Surgical History:  Procedure Laterality Date  . BREAST SURGERY  x 2   breast reduction and lumpectomy  . CESAREAN SECTION N/A 05/10/2014   Procedure: CESAREAN SECTION;  Surgeon: Freddrick MarchKendra H. Tenny Crawoss, MD;  Location: [facility];  Service: Obstetrics;  Laterality: N/A;  . CESAREAN SECTION N/A 06/05/2015   Procedure: CESAREAN SECTION;  Surgeon: Waynard ReedsKendra Ross, MD;  Location: [facility];  Service: Obstetrics;  Laterality: N/A;  . CHOLECYSTECTOMY N/A 06/29/2015   Procedure: LAPAROSCOPIC CHOLECYSTECTOMY WITH INTRAOPERATIVE CHOLANGIOGRAM;  Surgeon: Gaynelle AduEric Wilson, MD;  [facility];  Service: General;  Laterality: N/A;   Family History  Problem Relation Age of Onset  . Bipolar disorder Mother   . Asthma Mother   . Cancer Father   . Sickle cell anemia Brother   . Breast cancer Maternal Grandmother   . Diabetes Maternal Grandmother   . Diabetes  Maternal Grandfather   . Asthma Sister   . Asthma      nephew  . COPD Maternal Aunt    Social History  Substance Use Topics  . Smoking status: Former Smoker    Packs/day: 1.00    Years: 8.00    Types: Cigarettes    Quit date: 09/09/2013  . Smokeless tobacco: Never Used  . Alcohol use No   OB History    Gravida Para Term Preterm AB Living   2 2 2     2    SAB TAB Ectopic Multiple Live Births         0 2     Review of Systems  Constitutional: Negative for chills and fever.  HENT: Negative for congestion, ear pain, sore throat, trouble swallowing and voice change.   Respiratory: Negative for cough and shortness of breath.   Cardiovascular: Negative for chest pain and palpitations.  Gastrointestinal: Positive for abdominal pain, diarrhea, nausea and vomiting.  Genitourinary: Negative for dysuria, flank pain, frequency and urgency.  Musculoskeletal: Negative for arthralgias, back pain and myalgias.  Skin: Negative for rash.    Allergies  Nsaids and Soybean-containing drug products  Home Medications   Prior to Admission medications   Medication Sig Start Date End Date Taking? Authorizing Provider  albuterol (PROVENTIL HFA;VENTOLIN HFA) 108 (90 BASE) MCG/ACT inhaler Inhale 2 puffs into the lungs every 6 (six) hours as needed for  wheezing.   Yes Historical Provider, MD  albuterol (PROVENTIL) (2.5 MG/3ML) 0.083% nebulizer solution Take 2.5 mg by nebulization every 6 (six) hours as needed for wheezing or shortness of breath.   Yes Historical Provider, MD  budesonide-formoterol (SYMBICORT) 80-4.5 MCG/ACT inhaler Inhale 2 puffs into the lungs daily.   Yes Historical Provider, MD  cetirizine (ZYRTEC) 10 MG tablet Take 10 mg by mouth daily.   Yes Historical Provider, MD  medroxyPROGESTERone (DEPO-PROVERA) 150 MG/ML injection Inject 150 mg into the muscle every 3 (three) months.   Yes Historical Provider, MD  midodrine (PROAMATINE) 10 MG tablet Take 10 mg by mouth 3 (three) times daily.    Yes Historical Provider, MD  omeprazole (PRILOSEC) 20 MG capsule Take 20 mg by mouth daily.  08/12/15  Yes Historical Provider, MD  topiramate (TOPAMAX) 25 MG tablet Take 25 mg by mouth daily.  08/14/15  Yes Historical Provider, MD  acetaminophen (TYLENOL) 325 MG tablet Take 2 tablets (650 mg total) by mouth every 6 (six) hours as needed (You can only take 4000 mg of acetaminophen per day (Tylenol)  it is in your pain medicine so count the amout you take each day.  Don't go over 4000 mg.). 06/30/15   Sherrie GeorgeWillard Jennings, PA-C  HYDROcodone-acetaminophen (NORCO/VICODIN) 5-325 MG tablet Take 1 tablet by mouth every 6 (six) hours as needed for moderate pain. 03/01/16   Azalia BilisKevin Campos, MD  ondansetron (ZOFRAN) 4 MG tablet Take 1 tablet (4 mg total) by mouth every 6 (six) hours. 04/19/16   Junius FinnerErin O'Malley, PA-C   Meds Ordered and Administered this Visit   Medications  ondansetron (ZOFRAN-ODT) disintegrating tablet 4 mg (4 mg Oral Given 04/19/16 1540)    Pulse 97   Temp 98.7 F (37.1 C) (Oral)   Resp 20   SpO2 100%  No data found.   Physical Exam  Constitutional: She appears well-developed and well-nourished. No distress.  Pt appears well, non-toxic. NAD  HENT:  Head: Normocephalic and atraumatic.  Right Ear: Tympanic membrane normal.  Left Ear: Tympanic membrane normal.  Nose: Nose normal.  Mouth/Throat: Uvula is midline, oropharynx is clear and moist and mucous membranes are normal.  Eyes: Conjunctivae are normal. No scleral icterus.  Neck: Normal range of motion. Neck supple.  Cardiovascular: Normal rate, regular rhythm and normal heart sounds.   Pulmonary/Chest: Effort normal and breath sounds normal. No respiratory distress. She has no wheezes. She has no rales.  Abdominal: Soft. She exhibits no distension and no mass. There is no tenderness. There is no rebound and no guarding.  Musculoskeletal: Normal range of motion.  Neurological: She is alert.  Skin: Skin is warm and dry. She is not  diaphoretic.  Nursing note and vitals reviewed.   Urgent Care Course   Clinical Course     Procedures (including critical care time)  Labs Review Labs Reviewed - No data to display  Imaging Review No results found.    MDM   1. Nausea vomiting and diarrhea    Pt c/o upper abdominal pain, n/v/d since last night and reports her 37mo old son had vomiting and diarrhea today at daycare.  Benign abdominal exam. Symptoms likely viral in nature.  Rx: Zofran Home care instructions provided. F/u with PCP later this week if not improving. Patient verbalized understanding and agreement with treatment plan.    Junius FinnerErin O'Malley, PA-C 04/19/16 [phone]

## 2016-04-19 NOTE — ED Triage Notes (Signed)
[facility] with a complaint of upper abdominal pain that started last night. The patient stated that she has been having abdominal pain with N/V/D.

## 2016-05-21 ENCOUNTER — Ambulatory Visit: Payer: Medicare Other | Admitting: Neurology

## 2016-07-15 ENCOUNTER — Ambulatory Visit (INDEPENDENT_AMBULATORY_CARE_PROVIDER_SITE_OTHER): Payer: Medicare Other | Admitting: Neurology

## 2016-07-15 ENCOUNTER — Encounter: Payer: Self-pay | Admitting: Neurology

## 2016-07-15 VITALS — BP 108/70 | HR 75 | Ht 65.0 in | Wt 298.3 lb

## 2016-07-15 DIAGNOSIS — M5412 Radiculopathy, cervical region: Secondary | ICD-10-CM | POA: Diagnosis not present

## 2016-07-15 DIAGNOSIS — M5417 Radiculopathy, lumbosacral region: Secondary | ICD-10-CM

## 2016-07-15 MED ORDER — CYCLOBENZAPRINE HCL 5 MG PO TABS: 5.0000 mg | ORAL_TABLET | Freq: Every evening | ORAL | 3 refills | Status: DC | PRN

## 2016-07-15 NOTE — Patient Instructions (Addendum)
1.  Start flexeril 5mg  at bedtime as needed  2.  Start physical therapy for neck and low back  Return to clinic 4 months
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
## 2016-08-02 ENCOUNTER — Ambulatory Visit: Payer: Medicare Other | Admitting: Neurology

## 2016-09-22 ENCOUNTER — Encounter: Payer: Self-pay | Admitting: Neurology

## 2016-09-22 ENCOUNTER — Ambulatory Visit (INDEPENDENT_AMBULATORY_CARE_PROVIDER_SITE_OTHER): Payer: Medicare Other | Admitting: Neurology

## 2016-09-22 VITALS — BP 110/80 | HR 70 | Ht 65.0 in | Wt 298.0 lb

## 2016-09-22 DIAGNOSIS — M5417 Radiculopathy, lumbosacral region: Secondary | ICD-10-CM

## 2016-09-22 MED ORDER — TIZANIDINE HCL 2 MG PO TABS: 2.0000 mg | ORAL_TABLET | Freq: Every evening | ORAL | 5 refills | Status: DC | PRN

## 2016-09-22 NOTE — Patient Instructions (Addendum)
Start tizanidine  at bedtime Continue home exercises  Return to clinic as needed
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
## 2016-11-17 ENCOUNTER — Ambulatory Visit: Payer: Medicare Other | Admitting: Neurology

## 2017-01-05 ENCOUNTER — Encounter (HOSPITAL_COMMUNITY): Payer: Self-pay

## 2017-01-05 ENCOUNTER — Inpatient Hospital Stay (HOSPITAL_COMMUNITY)
Admission: AD | Admit: 2017-01-05 | Discharge: 2017-01-05 | Disposition: A | Discharge status: Home / Self Care | Payer: Medicare Other | Source: Ambulatory Visit | Attending: Obstetrics and Gynecology | Admitting: Obstetrics and Gynecology

## 2017-01-05 DIAGNOSIS — J45909 Unspecified asthma, uncomplicated: Secondary | ICD-10-CM | POA: Insufficient documentation

## 2017-01-05 DIAGNOSIS — D649 Anemia, unspecified: Secondary | ICD-10-CM | POA: Diagnosis not present

## 2017-01-05 DIAGNOSIS — Z3202 Encounter for pregnancy test, result negative: Secondary | ICD-10-CM | POA: Insufficient documentation

## 2017-01-05 DIAGNOSIS — N939 Abnormal uterine and vaginal bleeding, unspecified: Secondary | ICD-10-CM | POA: Diagnosis not present

## 2017-01-05 DIAGNOSIS — F329 Major depressive disorder, single episode, unspecified: Secondary | ICD-10-CM | POA: Insufficient documentation

## 2017-01-05 DIAGNOSIS — Z87891 Personal history of nicotine dependence: Secondary | ICD-10-CM | POA: Diagnosis not present

## 2017-01-05 DIAGNOSIS — I951 Orthostatic hypotension: Secondary | ICD-10-CM | POA: Diagnosis not present

## 2017-01-05 DIAGNOSIS — F4481 Dissociative identity disorder: Secondary | ICD-10-CM | POA: Diagnosis not present

## 2017-01-05 DIAGNOSIS — N921 Excessive and frequent menstruation with irregular cycle: Secondary | ICD-10-CM | POA: Diagnosis not present

## 2017-01-05 DIAGNOSIS — R110 Nausea: Secondary | ICD-10-CM | POA: Diagnosis not present

## 2017-01-05 DIAGNOSIS — K219 Gastro-esophageal reflux disease without esophagitis: Secondary | ICD-10-CM | POA: Diagnosis not present

## 2017-01-05 DIAGNOSIS — M549 Dorsalgia, unspecified: Secondary | ICD-10-CM | POA: Diagnosis present

## 2017-01-05 DIAGNOSIS — Z793 Long term (current) use of hormonal contraceptives: Secondary | ICD-10-CM | POA: Insufficient documentation

## 2017-01-05 DIAGNOSIS — F4310 Post-traumatic stress disorder, unspecified: Secondary | ICD-10-CM | POA: Diagnosis not present

## 2017-01-05 DIAGNOSIS — R109 Unspecified abdominal pain: Secondary | ICD-10-CM | POA: Diagnosis present

## 2017-01-05 LAB — URINALYSIS, ROUTINE W REFLEX MICROSCOPIC
BACTERIA UA: NONE SEEN
BILIRUBIN URINE: NEGATIVE
Glucose, UA: NEGATIVE mg/dL
KETONES UR: NEGATIVE mg/dL
LEUKOCYTES UA: NEGATIVE
Nitrite: NEGATIVE
PH: 6 (ref 5.0–8.0)
PROTEIN: NEGATIVE mg/dL
Specific Gravity, Urine: 1.012 (ref 1.005–1.030)

## 2017-01-05 LAB — CBC
HCT: 36.8 % (ref 36.0–46.0)
Hemoglobin: 12 g/dL (ref 12.0–15.0)
MCH: 26.5 pg (ref 26.0–34.0)
MCHC: 32.6 g/dL (ref 30.0–36.0)
MCV: 81.2 fL (ref 78.0–100.0)
PLATELETS: 336 10*3/uL (ref 150–400)
RBC: 4.53 MIL/uL (ref 3.87–5.11)
RDW: 14.5 % (ref 11.5–15.5)
WBC: 10 10*3/uL (ref 4.0–10.5)

## 2017-01-05 LAB — WET PREP, GENITAL
CLUE CELLS WET PREP: NONE SEEN
SPERM: NONE SEEN
TRICH WET PREP: NONE SEEN
YEAST WET PREP: NONE SEEN

## 2017-01-05 LAB — POCT PREGNANCY, URINE
PREG TEST UR: NEGATIVE
PREG TEST UR: NEGATIVE
PREG TEST UR: NEGATIVE

## 2017-01-05 MED ORDER — NORGESTIMATE-ETH ESTRADIOL 0.25-35 MG-MCG PO TABS: 1.0000 | ORAL_TABLET | Freq: Every day | ORAL | 11 refills | Status: DC

## 2017-01-05 NOTE — MAU Provider Note (Signed)
Chief Complaint: Vaginal Bleeding; Abdominal Pain; and Back Pain   First Provider Initiated Contact with Patient 01/05/17 2055      SUBJECTIVE HPI: Shannon Aguirre is a 31 y.o. [id number] who presents to maternity admissions reporting vaginal bleeding, abdominal cramping, and nausea x 10 days.  She reports using Depo Provera as contraception for almost 2 years, last dose 12/02/16, which was on schedule.  She has hx of irregular menses which was not well controlled by OCPs and her Medicare insurance does not cover LARCs so she chose Depo.  She has not tried any treatments, nothing makes it better or worse.  There are no other associated symptoms.  She has a primary care and OB/Gyn but is waiting on changes to her insurance and cannot be seen in the office yet. She denies vaginal itching/burning, urinary symptoms, h/a, dizziness, n/v, or fever/chills.     HPI  Past Medical History:  Diagnosis Date  . Allergy   . Anemia   . Asthma   . Depression   . Dissociative identity disorder   . GERD (gastroesophageal reflux disease)    with pregnancy  . Headache   . Orthostatic hypotension   . PTSD (post-traumatic stress disorder)    Past Surgical History:  Procedure Laterality Date  . BREAST SURGERY  x 2   breast reduction and lumpectomy  . CESAREAN SECTION N/A 05/10/2014   Procedure: CESAREAN SECTION;  Surgeon: Freddrick March. Tenny Craw, MD;  Location: [facility];  Service: Obstetrics;  Laterality: N/A;  . CESAREAN SECTION N/A 06/05/2015   Procedure: CESAREAN SECTION;  Surgeon: Waynard Reeds, MD;  Location: [facility];  Service: Obstetrics;  Laterality: N/A;  . CHOLECYSTECTOMY N/A 06/29/2015   Procedure: LAPAROSCOPIC CHOLECYSTECTOMY WITH INTRAOPERATIVE CHOLANGIOGRAM;  Surgeon: Gaynelle Adu, MD;  Location: [facility] OR;  Service: General;  Laterality: N/A;   Social History   Social History  . Marital status: Single    Spouse name: N/A  . Number of children: 2  . Years of education: N/A   Occupational History  . Banker    Social History Main Topics  . Smoking status: Former Smoker    Packs/day: 1.00    Years: 8.00    Types: Cigarettes    Quit date: 09/09/2013  . Smokeless tobacco: Never Used  . Alcohol use No  . Drug use: No  . Sexual activity: Yes    Birth control/ protection: None   Other Topics Concern  . Not on file   Social History Narrative   Lives with 2 children and a roommate.     Works as a Psychologist, forensic. Education: college.    No current facility-administered medications on file prior to encounter.    Current Outpatient Prescriptions on File Prior to Encounter  Medication Sig Dispense Refill  . medroxyPROGESTERone (DEPO-PROVERA) 150 MG/ML injection Inject 150 mg into the muscle every 3 (three) months.    Marland Kitchen acetaminophen (TYLENOL) 325 MG tablet Take 2 tablets (650 mg total) by mouth every 6 (six) hours as needed (You can only take 4000 mg of acetaminophen per day (Tylenol)  it is in your pain medicine so count the amout you take each day.  Don't go over 4000 mg.).    Marland Kitchen albuterol (PROVENTIL HFA;VENTOLIN HFA) 108 (90 BASE) MCG/ACT inhaler Inhale 2 puffs into the lungs every 6 (six) hours as needed for wheezing.    Marland Kitchen albuterol (PROVENTIL) (2.5 MG/3ML) 0.083% nebulizer solution Take 2.5 mg by nebulization every 6 (six) hours as needed for wheezing  or shortness of breath.    . budesonide-formoterol (SYMBICORT) 80-4.5 MCG/ACT inhaler Inhale 2 puffs into the lungs daily.    Marland Kitchen buPROPion (WELLBUTRIN XL) 150 MG 24 hr tablet     . cetirizine (ZYRTEC) 10 MG tablet Take 10 mg by mouth daily.    . cyclobenzaprine (FLEXERIL) 5 MG tablet Take 1 tablet (5 mg total) by mouth at bedtime as needed for muscle spasms (neck and low back pain). 30 tablet 3  . HYDROcodone-acetaminophen (NORCO/VICODIN) 5-325 MG tablet     . midodrine (PROAMATINE) 10 MG tablet Take 10 mg by mouth 3 (three) times daily.    Marland Kitchen omeprazole (PRILOSEC) 20 MG capsule Take 20 mg by mouth daily.     Marland Kitchen tiZANidine (ZANAFLEX) 2 MG  tablet Take 1 tablet (2 mg total) by mouth at bedtime as needed for muscle spasms. 30 tablet 5  . topiramate (TOPAMAX) 25 MG tablet Take 25 mg by mouth daily.      Allergies  Allergen Reactions  . Nsaids Anaphylaxis  . Phentermine     Other reaction(s): Chest Pain  . Soybean-Containing Drug Products Swelling    Swelling of throat and lips    ROS:  Review of Systems  Constitutional: Negative for chills, fatigue and fever.  Respiratory: Negative for shortness of breath.   Cardiovascular: Negative for chest pain.  Gastrointestinal: Positive for nausea. Negative for constipation and vomiting.  Genitourinary: Positive for pelvic pain and vaginal bleeding. Negative for difficulty urinating, dysuria, flank pain, vaginal discharge and vaginal pain.  Neurological: Negative for dizziness and headaches.  Psychiatric/Behavioral: Negative.      I have reviewed patient's Past Medical Hx, Surgical Hx, Family Hx, Social Hx, medications and allergies.   Physical Exam  Patient Vitals for the past 24 hrs:  BP Temp Temp src Pulse Resp Height Weight  01/05/17 1838 (!) 143/72 97.8 F (36.6 C) Oral 78 18 5\' 5"  (1.651 m) (!) 302 lb (137 kg)   Constitutional: Well-developed, well-nourished female in no acute distress.  Cardiovascular: normal rate Respiratory: normal effort GI: Abd soft, non-tender. Pos BS x 4 MS: Extremities nontender, no edema, normal ROM Neurologic: Alert and oriented x 4.  GU: Neg CVAT.  PELVIC EXAM: Cervix pink, visually closed, without lesion, small amount dark red bleeding, vaginal walls and external genitalia normal Bimanual exam: Cervix 0/long/high, firm, anterior, neg CMT, uterus mildly tender, nonenlarged, adnexa without tenderness, enlargement, or mass   LAB RESULTS Results for orders placed or performed during the hospital encounter of 01/05/17 (from the past 24 hour(s))  Urinalysis, Routine w reflex microscopic     Status: Abnormal   Collection Time: 01/05/17  6:42  PM  Result Value Ref Range   Color, Urine YELLOW YELLOW   APPearance CLEAR CLEAR   Specific Gravity, Urine 1.012 1.005 - 1.030   pH 6.0 5.0 - 8.0   Glucose, UA NEGATIVE NEGATIVE mg/dL   Hgb urine dipstick SMALL (A) NEGATIVE   Bilirubin Urine NEGATIVE NEGATIVE   Ketones, ur NEGATIVE NEGATIVE mg/dL   Protein, ur NEGATIVE NEGATIVE mg/dL   Nitrite NEGATIVE NEGATIVE   Leukocytes, UA NEGATIVE NEGATIVE   RBC / HPF 0-5 0 - 5 RBC/hpf   WBC, UA 0-5 0 - 5 WBC/hpf   Bacteria, UA NONE SEEN NONE SEEN   Squamous Epithelial / LPF 0-5 (A) NONE SEEN   Mucous PRESENT   Pregnancy, urine POC     Status: None   Collection Time: 01/05/17  6:58 PM  Result Value Ref Range  Preg Test, Ur NEGATIVE NEGATIVE  Pregnancy, urine POC     Status: None   Collection Time: 01/05/17  7:50 PM  Result Value Ref Range   Preg Test, Ur NEGATIVE NEGATIVE  Pregnancy, urine POC     Status: None   Collection Time: 01/05/17  8:48 PM  Result Value Ref Range   Preg Test, Ur NEGATIVE NEGATIVE  CBC     Status: None   Collection Time: 01/05/17  8:49 PM  Result Value Ref Range   WBC 10.0 4.0 - 10.5 K/uL   RBC 4.53 3.87 - 5.11 MIL/uL   Hemoglobin 12.0 12.0 - 15.0 g/dL   HCT 45.4 09.8 - 11.9 %   MCV 81.2 78.0 - 100.0 fL   MCH 26.5 26.0 - 34.0 pg   MCHC 32.6 30.0 - 36.0 g/dL   RDW 14.7 82.9 - 56.2 %   Platelets 336 150 - 400 K/uL  Wet prep, genital     Status: Abnormal   Collection Time: 01/05/17  9:07 PM  Result Value Ref Range   Yeast Wet Prep HPF POC NONE SEEN NONE SEEN   Trich, Wet Prep NONE SEEN NONE SEEN   Clue Cells Wet Prep HPF POC NONE SEEN NONE SEEN   WBC, Wet Prep HPF POC MANY (A) NONE SEEN   Sperm NONE SEEN        IMAGING No results found.  MAU Management/MDM: Ordered labs and reviewed results. Likely breakthrough bleeding on Depo, especially with pt hx of irregular periods prior to contraception.  Will treat with OCPs, Sprintex Rx sent to pharmacy. Pt denies personal or family hx of blood clots, is a  nonsmoker, and does not have migraines with aura.   Pt to f/u with her primary care provider and her OB/Gyn in September, when her new provider appears on her insurance card.  Pt stable at time of discharge.  ASSESSMENT 1. Breakthrough bleeding on depo provera   2. Abnormal uterine bleeding (AUB)     PLAN Discharge home with bleeding precautions  Allergies as of 01/05/2017      Reactions   Nsaids Anaphylaxis   Phentermine    Other reaction(s): Chest Pain   Soybean-containing Drug Products Swelling   Swelling of throat and lips      Medication List    TAKE these medications   acetaminophen 325 MG tablet Commonly known as:  TYLENOL Take 2 tablets (650 mg total) by mouth every 6 (six) hours as needed (You can only take 4000 mg of acetaminophen per day (Tylenol)  it is in your pain medicine so count the amout you take each day.  Don't go over 4000 mg.).   albuterol (2.5 MG/3ML) 0.083% nebulizer solution Commonly known as:  PROVENTIL Take 2.5 mg by nebulization every 6 (six) hours as needed for wheezing or shortness of breath.   albuterol 108 (90 Base) MCG/ACT inhaler Commonly known as:  PROVENTIL HFA;VENTOLIN HFA Inhale 2 puffs into the lungs every 6 (six) hours as needed for wheezing.   budesonide-formoterol 80-4.5 MCG/ACT inhaler Commonly known as:  SYMBICORT Inhale 2 puffs into the lungs daily.   buPROPion 150 MG 24 hr tablet Commonly known as:  WELLBUTRIN XL   cetirizine 10 MG tablet Commonly known as:  ZYRTEC Take 10 mg by mouth daily.   cyclobenzaprine 5 MG tablet Commonly known as:  FLEXERIL Take 1 tablet (5 mg total) by mouth at bedtime as needed for muscle spasms (neck and low back pain).   HYDROcodone-acetaminophen 5-325  MG tablet Commonly known as:  NORCO/VICODIN   medroxyPROGESTERone 150 MG/ML injection Commonly known as:  DEPO-PROVERA Inject 150 mg into the muscle every 3 (three) months.   midodrine 10 MG tablet Commonly known as:  PROAMATINE Take 10  mg by mouth 3 (three) times daily.   norgestimate-ethinyl estradiol 0.25-35 MG-MCG tablet Commonly known as:  ORTHO-CYCLEN,SPRINTEC,PREVIFEM Take 1 tablet by mouth daily.   omeprazole 20 MG capsule Commonly known as:  PRILOSEC Take 20 mg by mouth daily.   tiZANidine 2 MG tablet Commonly known as:  ZANAFLEX Take 1 tablet (2 mg total) by mouth at bedtime as needed for muscle spasms.   topiramate 25 MG tablet Commonly known as:  TOPAMAX Take 25 mg by mouth daily.      Follow-up Information    Ladora DanielBeal, Sheri, PA-C Follow up.   Specialty:  Physician Assistant Why:  As soon as possible, return to MAU as needed for gyn emergencies Contact information: [address] [place] [address] [phone]           Sharen CounterLisa Leftwich-Kirby Certified Nurse-Midwife 01/05/2017  9:52 PM

## 2017-01-05 NOTE — MAU Note (Signed)
Pt C/O bleeding for almost 2 weeks, is on depo, last shot was beginning of July.  Lower abd pain has become progressively worse, also back pain & HA.  Intermittent nausea.  Has changed 6 pads today, passed small clots.

## 2017-01-06 LAB — GC/CHLAMYDIA PROBE AMP (~~LOC~~) NOT AT ARMC
CHLAMYDIA, DNA PROBE: NEGATIVE
Neisseria Gonorrhea: NEGATIVE

## 2017-12-09 IMAGING — US US ABDOMEN COMPLETE
1 series · 14 of 25 positions shown · non-contrast
Comparison: 07/13/2013

CLINICAL DATA: Right upper quadrant abdominal pain, acute onset
this morning. 2 weeks postpartum from C-section delivery.

EXAM:
ABDOMEN ULTRASOUND COMPLETE

[Series 1: us abdomen complete · 0.25mm/px · 14 of 58 slices shown]
[im 1/58]
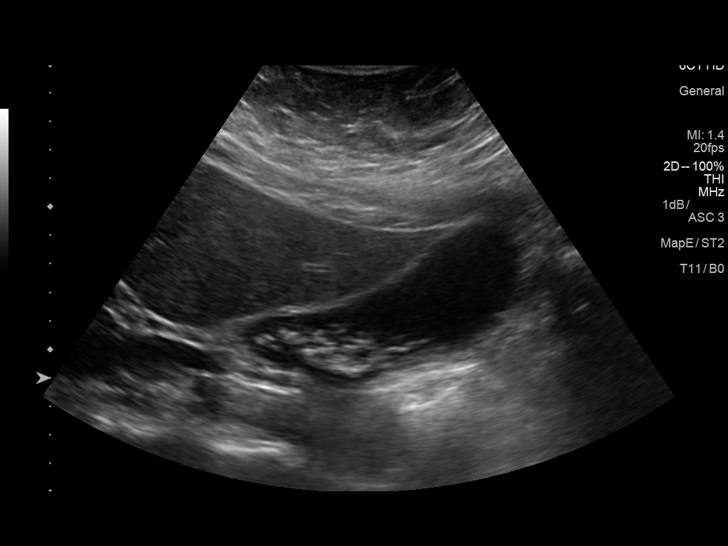
[im 5/58]
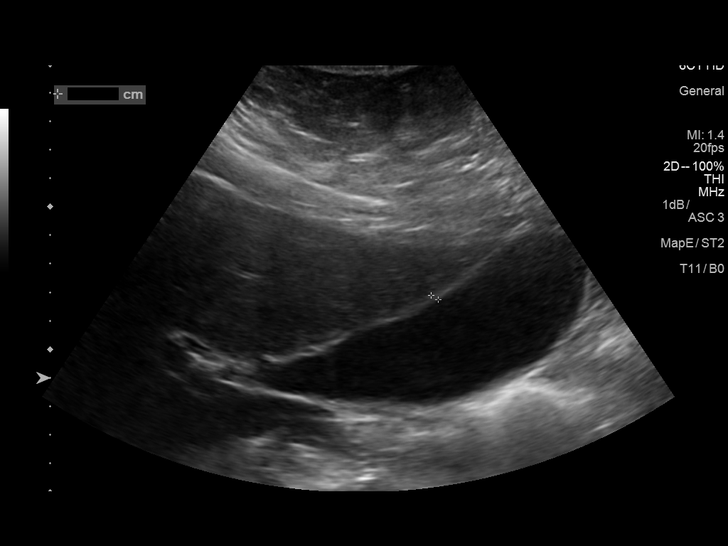
[im 10/58]
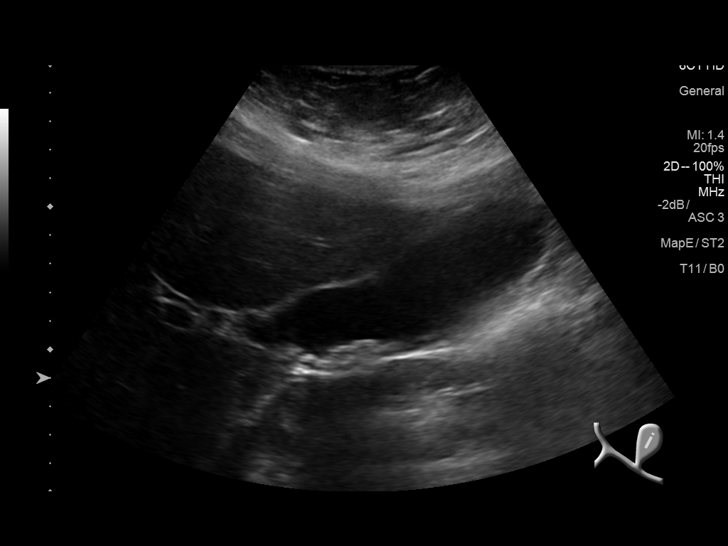
[im 15/58]
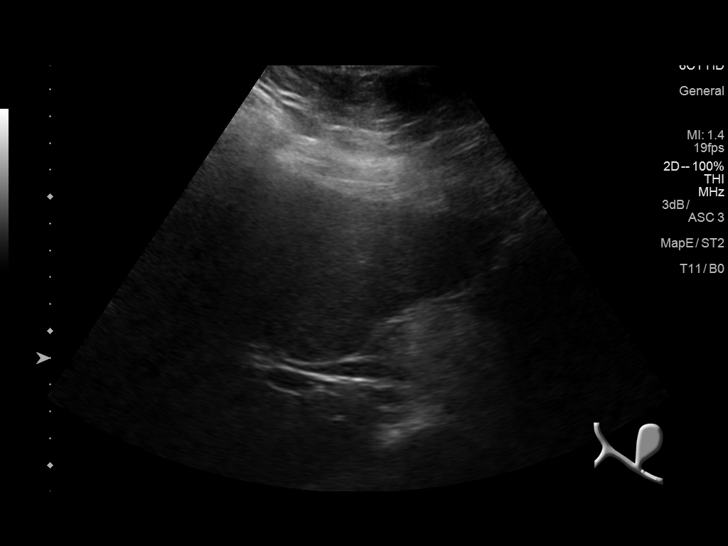
[im 20/58]
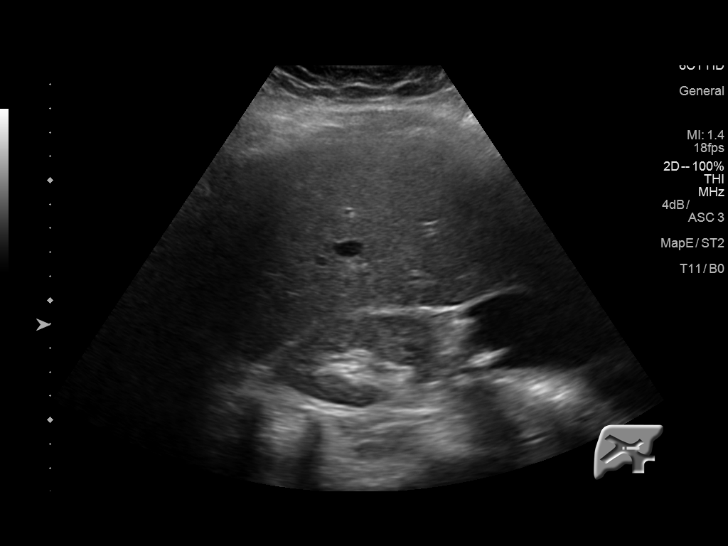
[im 22/58]
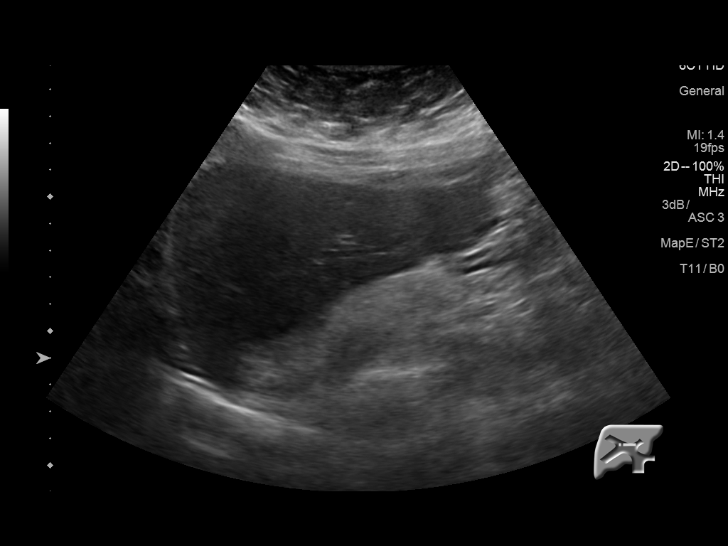
[im 27/58]
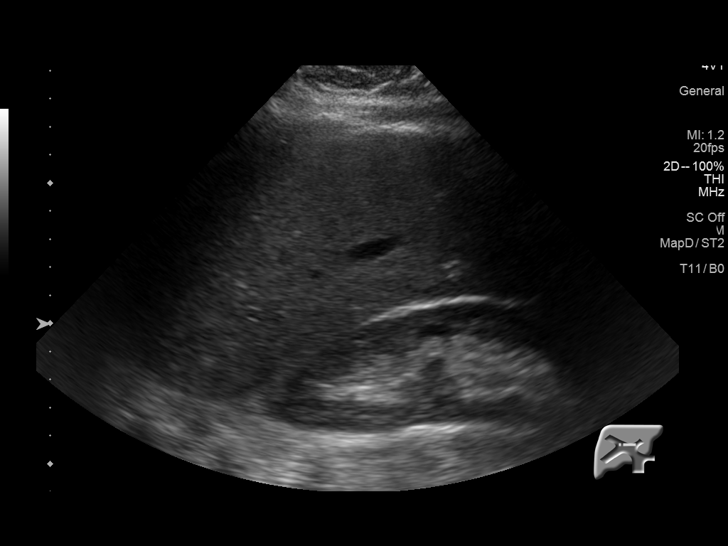
[im 31/58]
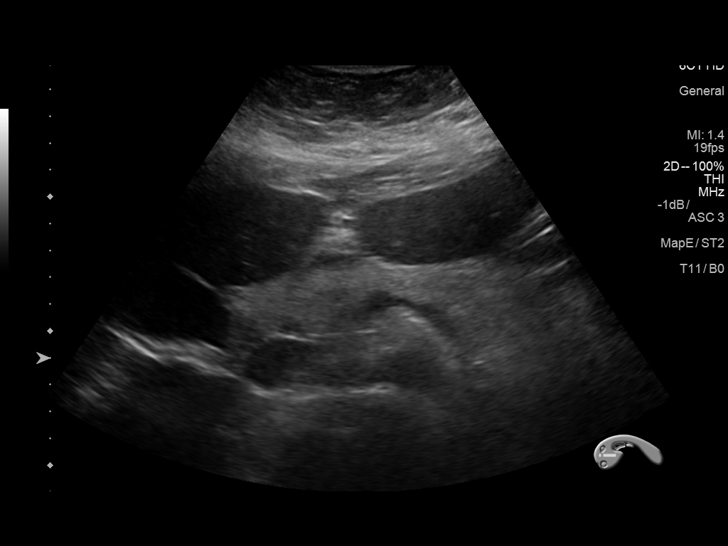
[im 36/58]
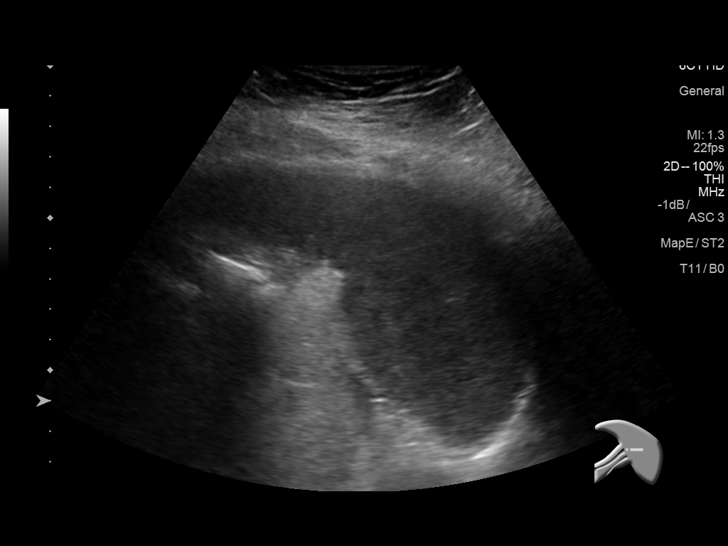
[im 39/58]
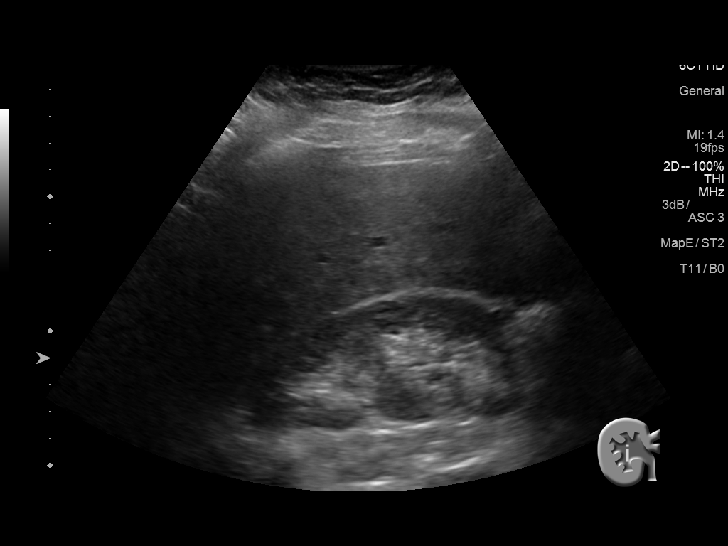
[im 43/58]
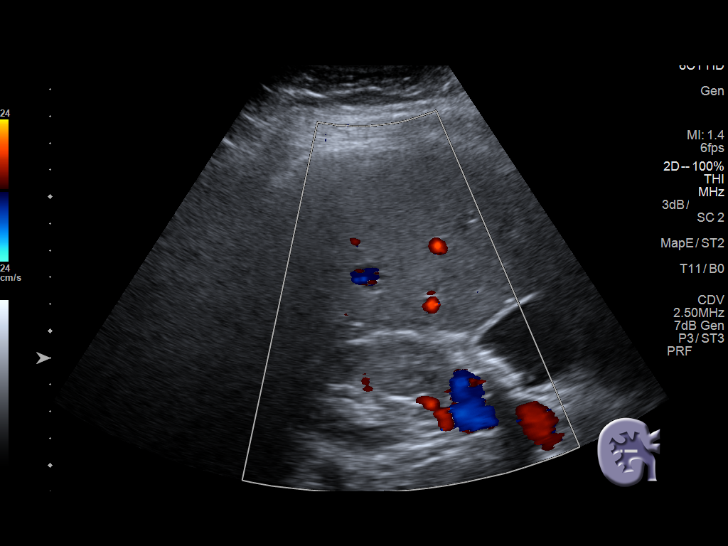
[im 48/58]
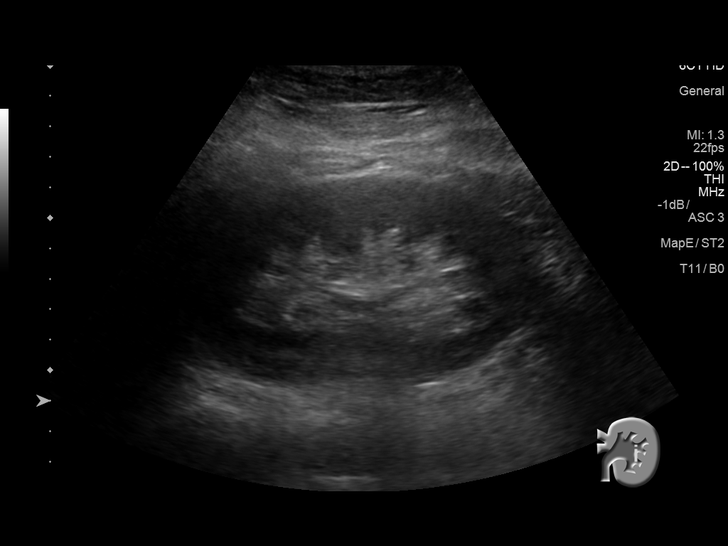
[im 53/58]
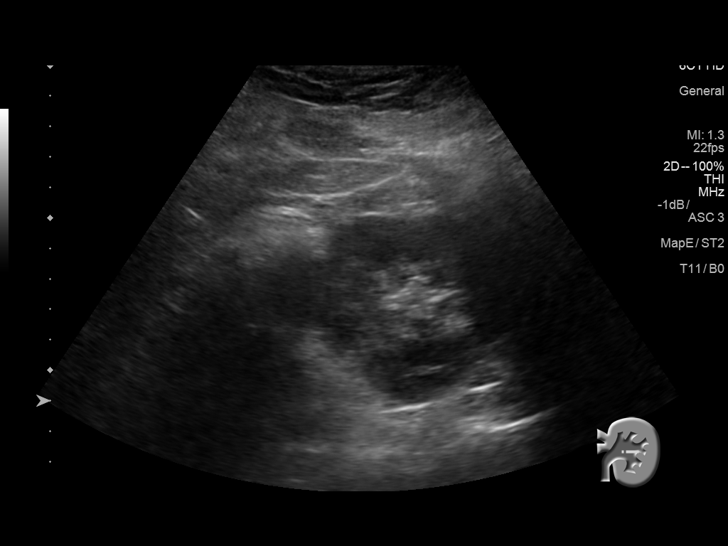
[im 58/58]
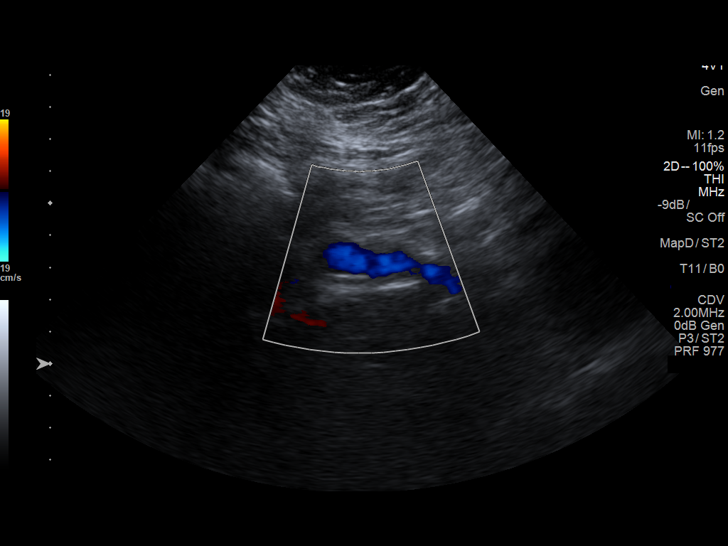

[14 of 25 positions shown; findings below may reference images not displayed]

FINDINGS: Gallbladder: Numerous small less than 1 cm gallstones are seen. No
evidence of gallbladder wall thickening or pericholecystic fluid. No
sonographic Murphy sign noted by sonographer.

Common bile duct: Diameter: 6 mm, within normal limits.

Liver: No focal lesion identified. Within normal limits in
parenchymal echogenicity.

IVC: No abnormality visualized.

Pancreas: Visualized portion unremarkable.

Spleen: Size and appearance within normal limits.

Right Kidney: Length: 13.0 cm. Echogenicity within normal limits. No
mass or hydronephrosis visualized.

Left Kidney: Length: 11.4 cm. Echogenicity within normal limits. No
mass or hydronephrosis visualized.

Abdominal aorta: No aneurysm visualized.

Other findings: None.
IMPRESSION: Cholelithiasis. No sonographic signs of acute cholecystitis, biliary
dilatation, or other acute findings.

No evidence of hydronephrosis.

## 2017-12-28 ENCOUNTER — Emergency Department (HOSPITAL_COMMUNITY)
Admission: EM | Admit: 2017-12-28 | Discharge: 2017-12-29 | Disposition: A | Discharge status: Home / Self Care | Payer: Medicare Other | Attending: Emergency Medicine | Admitting: Emergency Medicine

## 2017-12-28 ENCOUNTER — Encounter (HOSPITAL_COMMUNITY): Payer: Self-pay

## 2017-12-28 ENCOUNTER — Emergency Department (HOSPITAL_COMMUNITY): Payer: Medicare Other

## 2017-12-28 DIAGNOSIS — Z87891 Personal history of nicotine dependence: Secondary | ICD-10-CM | POA: Insufficient documentation

## 2017-12-28 DIAGNOSIS — Z79899 Other long term (current) drug therapy: Secondary | ICD-10-CM | POA: Diagnosis not present

## 2017-12-28 DIAGNOSIS — I951 Orthostatic hypotension: Secondary | ICD-10-CM | POA: Insufficient documentation

## 2017-12-28 DIAGNOSIS — E860 Dehydration: Secondary | ICD-10-CM | POA: Diagnosis not present

## 2017-12-28 DIAGNOSIS — J45909 Unspecified asthma, uncomplicated: Secondary | ICD-10-CM | POA: Diagnosis not present

## 2017-12-28 DIAGNOSIS — R112 Nausea with vomiting, unspecified: Secondary | ICD-10-CM | POA: Insufficient documentation

## 2017-12-28 LAB — LIPASE, BLOOD: LIPASE: 29 U/L (ref 11–51)

## 2017-12-28 LAB — URINALYSIS, ROUTINE W REFLEX MICROSCOPIC
BILIRUBIN URINE: NEGATIVE
Bacteria, UA: NONE SEEN
GLUCOSE, UA: NEGATIVE mg/dL
KETONES UR: NEGATIVE mg/dL
Leukocytes, UA: NEGATIVE
Nitrite: NEGATIVE
PH: 8 (ref 5.0–8.0)
PROTEIN: NEGATIVE mg/dL
Specific Gravity, Urine: 1.008 (ref 1.005–1.030)

## 2017-12-28 LAB — BASIC METABOLIC PANEL
ANION GAP: 11 (ref 5–15)
BUN: 8 mg/dL (ref 6–20)
CALCIUM: 9.1 mg/dL (ref 8.9–10.3)
CO2: 18 mmol/L — AB (ref 22–32)
Chloride: 111 mmol/L (ref 98–111)
Creatinine, Ser: 0.71 mg/dL (ref 0.44–1.00)
GFR calc Af Amer: >60 mL/min (ref >60)
GLUCOSE: 90 mg/dL (ref 70–99)
POTASSIUM: 3.5 mmol/L (ref 3.5–5.1)
Sodium: 140 mmol/L (ref 135–145)

## 2017-12-28 LAB — CBC
HEMATOCRIT: 35.2 % — AB (ref 36.0–46.0)
HEMOGLOBIN: 11.1 g/dL — AB (ref 12.0–15.0)
MCH: 26.2 pg (ref 26.0–34.0)
MCHC: 31.5 g/dL (ref 30.0–36.0)
MCV: 83.2 fL (ref 78.0–100.0)
Platelets: 368 10*3/uL (ref 150–400)
RBC: 4.23 MIL/uL (ref 3.87–5.11)
RDW: 15 % (ref 11.5–15.5)
WBC: 9.9 10*3/uL (ref 4.0–10.5)

## 2017-12-28 LAB — I-STAT CG4 LACTIC ACID, ED: Lactic Acid, Venous: 2.51 mmol/L (ref 0.5–1.9)

## 2017-12-28 LAB — I-STAT BETA HCG BLOOD, ED (MC, WL, AP ONLY): I-stat hCG, quantitative: <5 m[IU]/mL (ref <5)

## 2017-12-28 LAB — I-STAT TROPONIN, ED: Troponin i, poc: 0 ng/mL (ref 0.00–0.08)

## 2017-12-28 MED ORDER — FAMOTIDINE IN NACL 20-0.9 MG/50ML-% IV SOLN
20.0000 mg | Freq: Once | INTRAVENOUS | Status: AC
  Administered 2017-12-28: 20 mg via INTRAVENOUS
  Filled 2017-12-28: qty 50

## 2017-12-28 MED ORDER — SODIUM CHLORIDE 0.9 % IV BOLUS
2000.0000 mL | Freq: Once | INTRAVENOUS | Status: AC
  Administered 2017-12-28: 2000 mL via INTRAVENOUS

## 2017-12-28 MED ORDER — ONDANSETRON HCL 4 MG/2ML IJ SOLN
4.0000 mg | Freq: Once | INTRAMUSCULAR | Status: AC
  Administered 2017-12-28: 4 mg via INTRAVENOUS
  Filled 2017-12-28: qty 2

## 2017-12-28 MED ORDER — GI COCKTAIL ~~LOC~~
30.0000 mL | Freq: Once | ORAL | Status: AC
  Administered 2017-12-28: 30 mL via ORAL
  Filled 2017-12-28: qty 30

## 2017-12-28 NOTE — ED Triage Notes (Addendum)
Pt came in by EMS from home with reports of a asthma attack. Pt. Reports using her rescue inhaler and nebulizer at home without relief. Pt. Also reports recent syncopal episde and has been feeling sick the last couple of weeks. Pt. Does report central chest pain that radiates to her back. Pt. Has a hx of orthostatic hypotension. Pt. Given 5 mg of albuterol and 0.5 mg of atrovent en route. Pt also reports vomiting with blood for couple of weeks.  A/O X4 SPO2 100 on RA

## 2017-12-28 NOTE — ED Provider Notes (Signed)
[facility] EMERGENCY DEPARTMENT Provider Note   CSN: [id number] Arrival date & time: 12/28/17  2046     History   Chief Complaint No chief complaint on file.   HPI Shannon Aguirre is a 32 y.o. female.  Patient is a 33 year old female with a history of orthostatic hypotension in the past requiring Midrin, asthma, anemia, PTSD who is presenting today with 3 weeks of intermittent vomiting, upper abdominal discomfort and recurrent syncope.  States her symptoms started intermittently 3 weeks ago but it really gotten bad in the last 1 week.  She states anytime her epigastric area is hurting she seems to vomit anything she tries to eat up shortly afterwards.  However the pain is not constant and she can intermittently hold things down.  She denies any change in her bowel movements but does states she was taking Midrin and stopped a few months ago because her doctor felt like she was doing okay.  She is no longer on oral contraceptives as she is in a relationship with a female and has not had sexual intercourse with a man in over a year.  Her menses have been normal she denies any vaginal discharge or urinary symptoms.  She has had intermittent issues with her asthma which she describes as a cough and some chest tightness that improves with her breathing treatments.  She is also noted after multiple episodes of vomiting that she has had some pain in the center of her chest that goes into her back.  Prior surgical history is a cholecystectomy and 2 C-sections but no other abdominal surgeries.  Denies any recent medication changes.  The history is provided by the patient.    Past Medical History:  Diagnosis Date  . Allergy   . Anemia   . Asthma   . Depression   . Dissociative identity disorder (HCC)   . GERD (gastroesophageal reflux disease)    with pregnancy  . Headache   . Orthostatic hypotension   . PTSD (post-traumatic stress disorder)     Patient Active Problem List   Diagnosis Date Noted  . Atypical chest pain 01/15/2016  . Cough 12/24/2015  . Dyspnea and respiratory abnormality 12/24/2015  . Dizziness and giddiness 12/24/2015  . Gallstone pancreatitis 06/28/2015  . Cesarean delivery, delivered, current hospitalization 05/10/2014  . Rupture of membranes with delay of delivery 05/09/2014  . Asthma, mild persistent 03/16/2014  . Calf pain 03/11/2014  . PTSD (post-traumatic stress disorder) 10/14/2012  . Bipolar disorder, unspecified (HCC) 10/14/2012  . Dissociative identity disorder [facility]) 10/14/2012    Past Surgical History:  Procedure Laterality Date  . BREAST SURGERY  x 2   breast reduction and lumpectomy  . CESAREAN SECTION N/A 05/10/2014   Procedure: CESAREAN SECTION;  Surgeon: Freddrick March. Tenny Craw, MD;  Location: [facility];  Service: Obstetrics;  Laterality: N/A;  . CESAREAN SECTION N/A 06/05/2015   Procedure: CESAREAN SECTION;  Surgeon: Waynard Reeds, MD;  Location: [facility];  Service: Obstetrics;  Laterality: N/A;  . CHOLECYSTECTOMY N/A 06/29/2015   Procedure: LAPAROSCOPIC CHOLECYSTECTOMY WITH INTRAOPERATIVE CHOLANGIOGRAM;  Surgeon: Gaynelle Adu, MD;  Location: [facility] OR;  Service: General;  Laterality: N/A;     OB History    Gravida  2   Para  2   Term  2   Preterm      AB      Living  2     SAB      TAB      Ectopic  Multiple  0   Live Births  2            Home Medications    Prior to Admission medications   Medication Sig Start Date End Date Taking? Authorizing Provider  acetaminophen (TYLENOL) 325 MG tablet Take 2 tablets (650 mg total) by mouth every 6 (six) hours as needed (You can only take 4000 mg of acetaminophen per day (Tylenol)  it is in your pain medicine so count the amout you take each day.  Don't go over 4000 mg.). 06/30/15   Sherrie George, PA-C  albuterol (PROVENTIL HFA;VENTOLIN HFA) 108 (90 BASE) MCG/ACT inhaler Inhale 2 puffs into the lungs every 6 (six) hours as needed for wheezing.    [provider]  albuterol (PROVENTIL) (2.5 MG/3ML) 0.083% nebulizer solution Take 2.5 mg by nebulization every 6 (six) hours as needed for wheezing or shortness of breath.    [provider]  budesonide-formoterol (SYMBICORT) 80-4.5 MCG/ACT inhaler Inhale 2 puffs into the lungs daily.    [provider]  buPROPion (WELLBUTRIN XL) 150 MG 24 hr tablet  09/08/16   [provider]  cetirizine (ZYRTEC) 10 MG tablet Take 10 mg by mouth daily.    [provider]  cyclobenzaprine (FLEXERIL) 5 MG tablet Take 1 tablet (5 mg total) by mouth at bedtime as needed for muscle spasms (neck and low back pain). 07/15/16   Nita Sickle K, DO  HYDROcodone-acetaminophen (NORCO/VICODIN) 5-325 MG tablet  08/02/16   [provider]  medroxyPROGESTERone (DEPO-PROVERA) 150 MG/ML injection Inject 150 mg into the muscle every 3 (three) months.    [provider]  midodrine (PROAMATINE) 10 MG tablet Take 10 mg by mouth 3 (three) times daily.    [provider]  norgestimate-ethinyl estradiol (ORTHO-CYCLEN,SPRINTEC,PREVIFEM) 0.25-35 MG-MCG tablet Take 1 tablet by mouth daily. 01/05/17   Leftwich-Kirby, Wilmer Floor, CNM  omeprazole (PRILOSEC) 20 MG capsule Take 20 mg by mouth daily.  08/12/15   [provider]  tiZANidine (ZANAFLEX) 2 MG tablet Take 1 tablet (2 mg total) by mouth at bedtime as needed for muscle spasms. 09/22/16   Nita Sickle K, DO  topiramate (TOPAMAX) 25 MG tablet Take 25 mg by mouth daily.  08/14/15   [provider]    Family History Family History  Problem Relation Age of Onset  . Bipolar disorder Mother   . Asthma Mother   . Cancer Father   . Sickle cell anemia Brother   . Breast cancer Maternal Grandmother   . Diabetes Maternal Grandmother   . Diabetes Maternal Grandfather   . Asthma Sister   . Asthma Unknown        nephew  . COPD Maternal Aunt     Social History Social History   Tobacco Use  . Smoking status: Former Smoker     Packs/day: 1.00    Years: 8.00    Pack years: 8.00    Types: Cigarettes    Last attempt to quit: 09/09/2013    Years since quitting: 4.3  . Smokeless tobacco: Never Used  Substance Use Topics  . Alcohol use: No  . Drug use: No     Allergies   Nsaids; Phentermine; and Soybean-containing drug products   Review of Systems Review of Systems  All other systems reviewed and are negative.    Physical Exam Updated Vital Signs BP 111/69   Pulse 60   Temp 98.1 F (36.7 C) (Axillary)   Resp 16   Ht 5\' 5"  (  1.651 m)   Wt 129.7 kg (286 lb)   SpO2 100%   BMI 47.59 kg/m   Physical Exam  Constitutional: She is oriented to person, place, and time. She appears well-developed and well-nourished. No distress.  Mildly anxious and teeth chattering  HENT:  Head: Normocephalic and atraumatic.  Mouth/Throat: Oropharynx is clear and moist.  Eyes: Pupils are equal, round, and reactive to light. Conjunctivae and EOM are normal.  Neck: Normal range of motion. Neck supple.  Cardiovascular: Normal rate, regular rhythm and intact distal pulses.  No murmur heard. Pulmonary/Chest: Effort normal and breath sounds normal. No respiratory distress. She has no wheezes. She has no rales.  Abdominal: Soft. She exhibits no distension. There is tenderness. There is no rebound and no guarding.  Musculoskeletal: Normal range of motion. She exhibits no edema or tenderness.  Neurological: She is alert and oriented to person, place, and time.  Skin: Skin is warm and dry. No rash noted. No erythema.  Psychiatric: She has a normal mood and affect. Her behavior is normal.  Nursing note and vitals reviewed.    ED Treatments / Results  Labs (all labs ordered are listed, but only abnormal results are displayed) Labs Reviewed  BASIC METABOLIC PANEL - Abnormal; Notable for the following components:      Result Value   CO2 18 (*)    All other components within normal limits  CBC - Abnormal; Notable for the  following components:   Hemoglobin 11.1 (*)    HCT 35.2 (*)    All other components within normal limits  URINALYSIS, ROUTINE W REFLEX MICROSCOPIC - Abnormal; Notable for the following components:   Color, Urine STRAW (*)    Hgb urine dipstick SMALL (*)    All other components within normal limits  I-STAT CG4 LACTIC ACID, ED - Abnormal; Notable for the following components:   Lactic Acid, Venous 2.51 (*)    All other components within normal limits  LIPASE, BLOOD  I-STAT TROPONIN, ED  I-STAT BETA HCG BLOOD, ED ([facility], [facility], [facility] ONLY)    EKG EKG Interpretation  Date/Time:  Wednesday December 28 2017 20:59:30 EDT Ventricular Rate:  71 PR Interval:    QRS Duration: 117 QT Interval:  417 QTC Calculation: 454 R Axis:   57 Text Interpretation:  Sinus rhythm Short PR interval Biatrial enlargement Nonspecific intraventricular conduction delay Low voltage, precordial leads Baseline wander in lead(s) II III aVF V5 V6 No significant change since last tracing Confirmed by Gwyneth Sproutlunkett, Malkie Wille ([id number]) on 12/28/2017 9:03:09 PM   Radiology Dg Chest Port 1 View  Result Date: 12/28/2017 CLINICAL DATA:  Shortness of breath, cough EXAM: PORTABLE CHEST 1 VIEW COMPARISON:  03/01/2016 FINDINGS: Lungs are clear.  No pleural effusion or pneumothorax. The heart is normal in size. IMPRESSION: No evidence of acute cardiopulmonary disease. Electronically Signed   By: Charline BillsSriyesh  Krishnan M.D.   On: 12/28/2017 21:31    Procedures Procedures (including critical care time)  Medications Ordered in ED Medications  sodium chloride 0.9 % bolus 2,000 mL (2,000 mLs Intravenous New Bag/Given 12/28/17 2129)  ondansetron (ZOFRAN) injection 4 mg (4 mg Intravenous Given 12/28/17 2127)     Initial Impression / Assessment and Plan / ED Course  I have reviewed the triage vital signs and the nursing notes.  Pertinent labs & imaging results that were available during my care of the patient were reviewed by me and considered in my  medical decision making (see chart for details).     Patient  is a 32 year old female presenting today with 3 weeks of intermittent vomiting concern for dehydration and recurrent syncope.  She does have a history of orthostatic syncope and was on Midrin but stopped taking approximately 2 months ago.  Her doctor did not think she needed it anymore.  She states there is no way she could be pregnant because she has not had sex with a man in over a year and she is in a relationship with a woman.  She does not take any OCPs right now.  Patient's chest pain is atypical for PE or dissection.  It did not start until after all the vomiting and feel most likely it is related to reflux and excessive vomiting.  Patient's vomiting is unusual.  She does not have focal abdominal findings concerning for pancreatitis or hepatitis.  She has not had infectious symptoms.  She does not appear fluid overloaded and has no peritoneal signs. Concern for a GI cause of her excessive vomiting lower concern for dissection, ACS or PE.  Low suspicion for perforation, appendicitis or diverticulitis but possible pancreatitis.  She is not currently having any wheezing and low suspicion that this is related to her asthma.  Vital signs are within normal limits.  CBC, CMP, lipase, troponin, EKG, chest x-ray pending.  Patient given IV fluids, Pepcid, GI cocktail and Zofran.  11:58 PM Other than elevated lactate the rest of the patient's labs are reassuring.  She is feeling better after IV fluids and Zofran.  Her nausea is completely resolved.  She is tolerating p.o.'s.  She is still feeling dizzy with standing but has a history of orthostatic hypotension.  Discussed with patient continuing her omeprazole.  Feel most likely the patient's symptoms are related to gastritis or just dehydration.  Will have her use Zofran as needed and will give Carafate.  Also discussed restarting her Midrin which she was taking 10 mg 3 times daily.  She will call her  doctor tomorrow with no want for further evaluation.  Also discussed the possibility of needing GI follow-up if symptoms do not improve.  After eating crackers and sprite she started to feel nauseated again and she was given reglan  Final Clinical Impressions(s) / ED Diagnoses   Final diagnoses:  Intractable vomiting with nausea, unspecified vomiting type  Dehydration  Orthostatic hypotension    ED Discharge Orders    None       Gwyneth Sprout, MD 12/29/17 [phone]

## 2017-12-29 DIAGNOSIS — R112 Nausea with vomiting, unspecified: Secondary | ICD-10-CM | POA: Diagnosis not present

## 2017-12-29 MED ORDER — METOCLOPRAMIDE HCL 5 MG/ML IJ SOLN
10.0000 mg | Freq: Once | INTRAMUSCULAR | Status: AC
  Administered 2017-12-29: 10 mg via INTRAVENOUS
  Filled 2017-12-29: qty 2

## 2017-12-29 MED ORDER — ONDANSETRON 8 MG PO TBDP: 8.0000 mg | ORAL_TABLET | Freq: Three times a day (TID) | ORAL | 0 refills | Status: DC | PRN

## 2017-12-29 MED ORDER — MIDODRINE HCL 5 MG PO TABS
10.0000 mg | ORAL_TABLET | Freq: Once | ORAL | Status: AC
  Administered 2017-12-29: 10 mg via ORAL
  Filled 2017-12-29: qty 2

## 2017-12-29 MED ORDER — MIDODRINE HCL 10 MG PO TABS: 10.0000 mg | ORAL_TABLET | Freq: Three times a day (TID) | ORAL | 0 refills | Status: DC

## 2017-12-29 NOTE — Discharge Instructions (Signed)
Start taking the medications prescribed. See your doctor as soon as possible.  Please return to the ER if your symptoms worsen; you have increased pain, fevers, chills, inability to keep any medications down, confusion. Otherwise see the outpatient doctor as requested.

## 2018-03-10 IMAGING — CT CT ANGIO CHEST
1 of 8 series · 17 of 36 positions shown · IV contrast (Iohexol (Omnipaque 350))
Comparison: None.

CLINICAL DATA: Dyspnea, elevated D-dimer

EXAM:
CT ANGIOGRAPHY CHEST WITH CONTRAST
TECHNIQUE: Multidetector CT imaging of the chest was performed using the
standard protocol during bolus administration of intravenous
contrast. Multiplanar CT image reconstructions and MIPs were
obtained to evaluate the vascular anatomy.
CONTRAST:  100 cc Isovue

[Series 406: thins pacs · axial · 0.71mm/px · z∈[+253,+462]mm · 17 of 237 slices shown]
[im 14/237  lung]
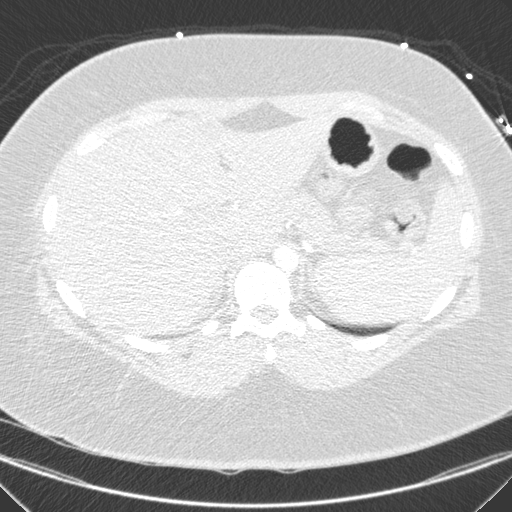
[im 27/237  mediastinal]
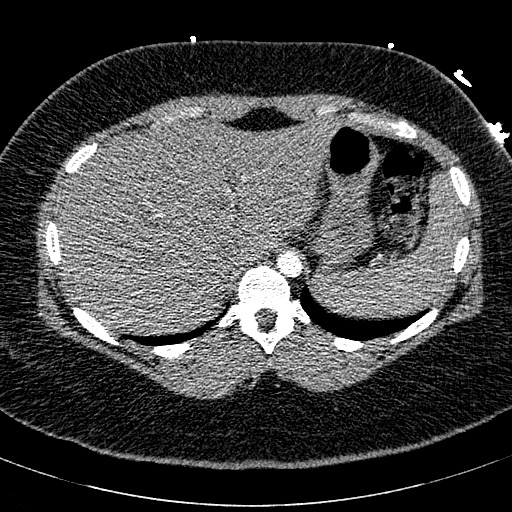
[im 40/237  lung]
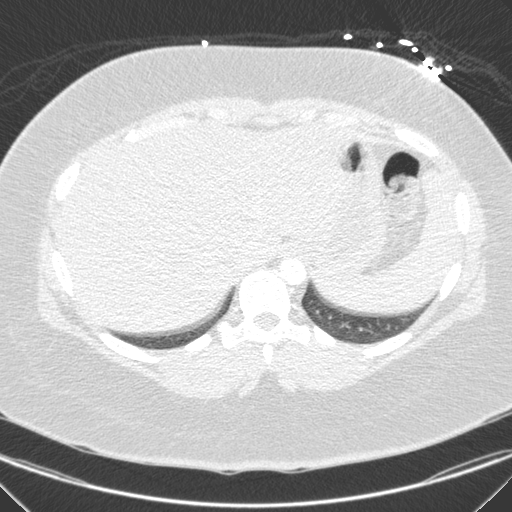
[im 53/237  mediastinal]
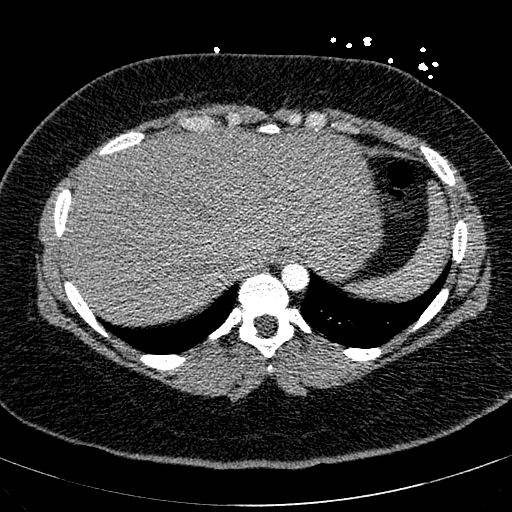
[im 66/237  lung]
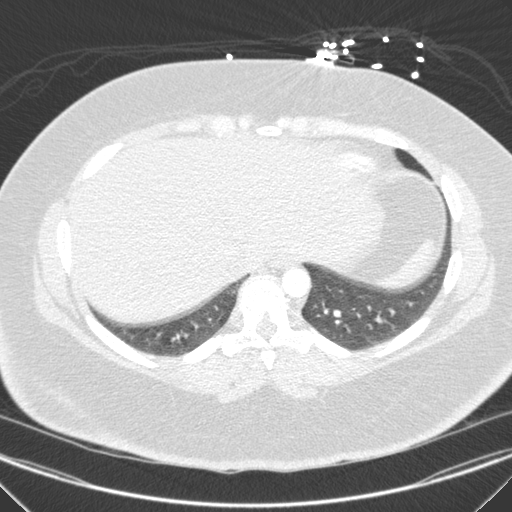
[im 79/237  mediastinal]
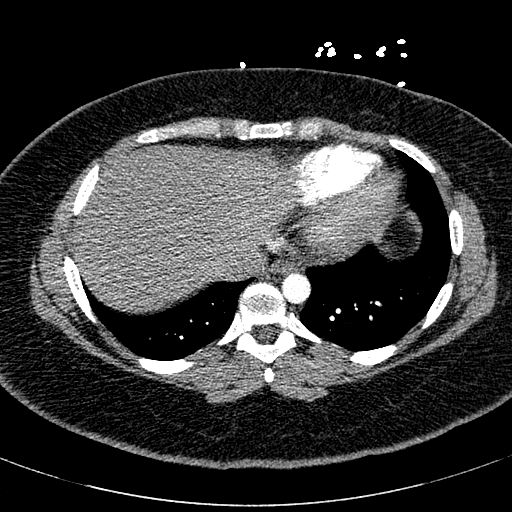
[im 92/237  lung]
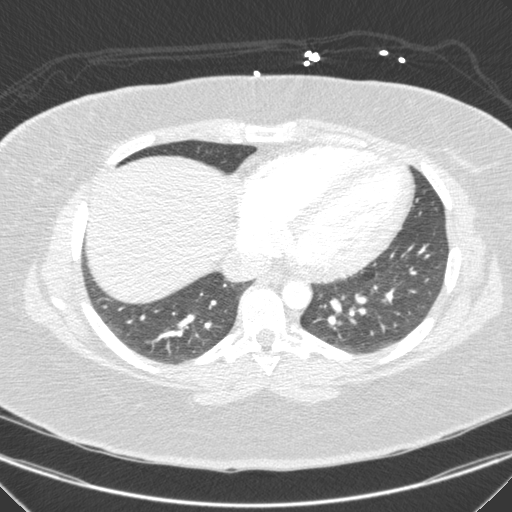
[im 105/237  mediastinal]
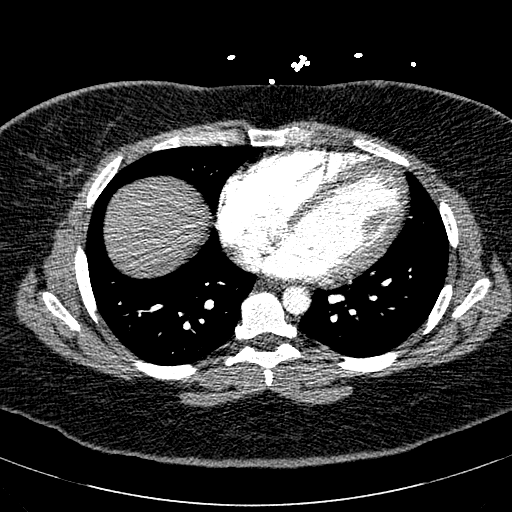
[im 119/237  lung]
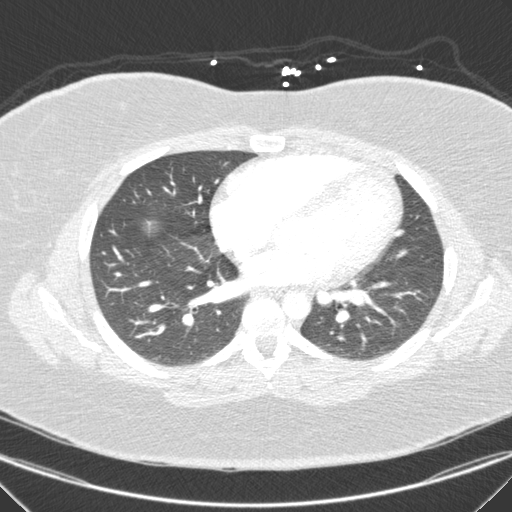
[im 132/237  mediastinal]
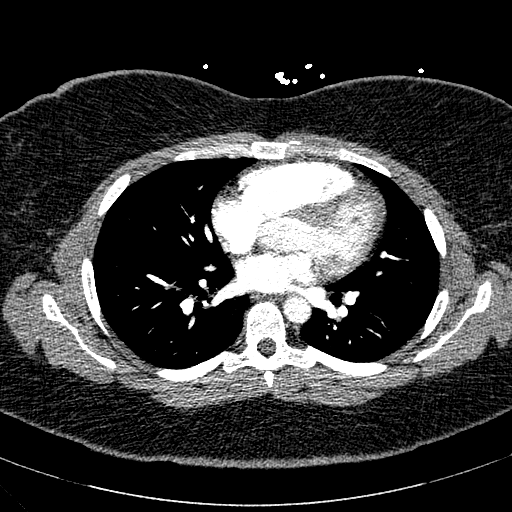
[im 145/237  lung]
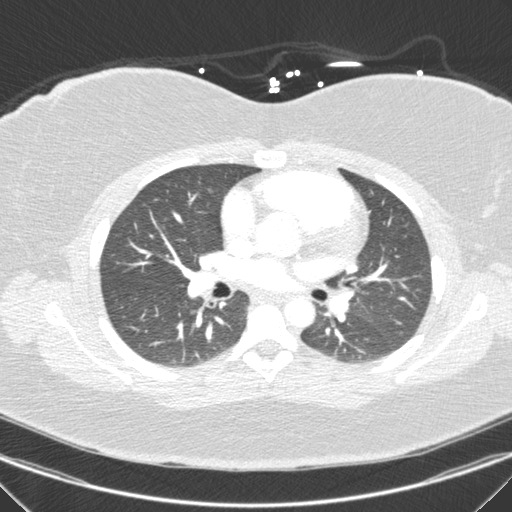
[im 158/237  mediastinal]
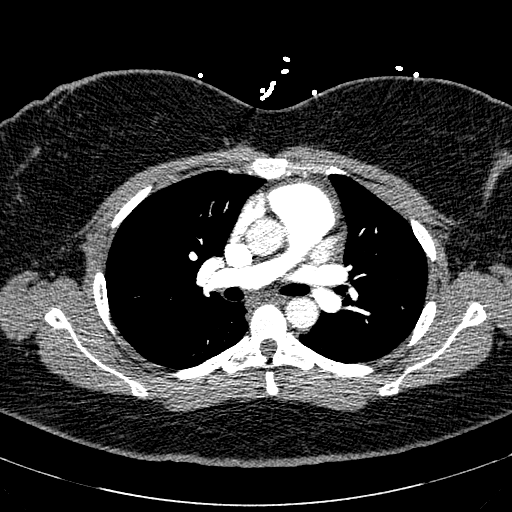
[im 171/237  lung]
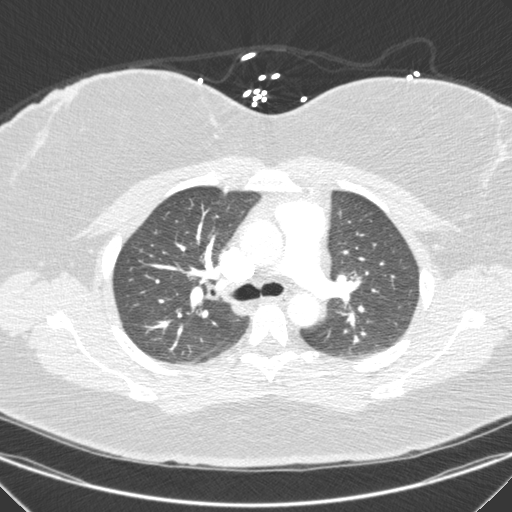
[im 184/237  mediastinal]
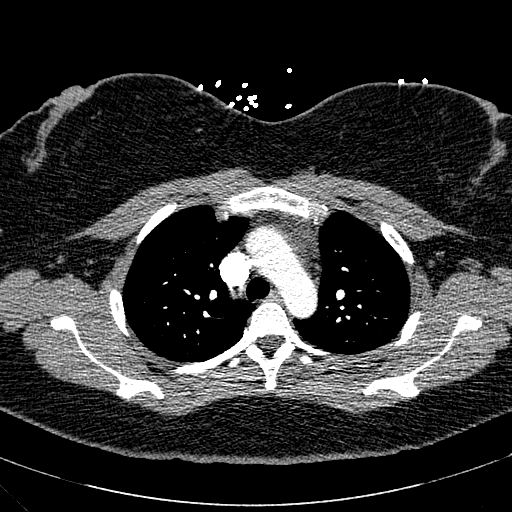
[im 197/237  lung]
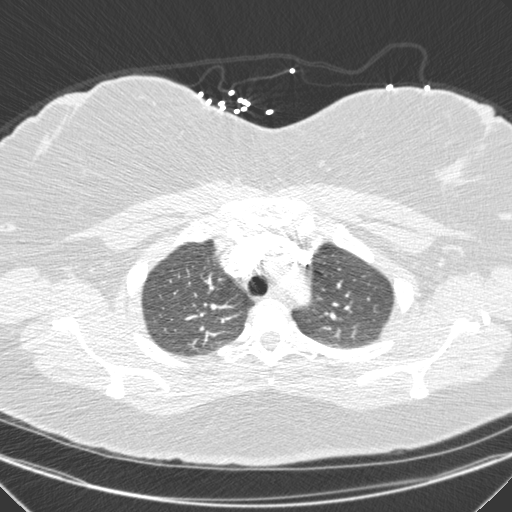
[im 210/237  mediastinal]
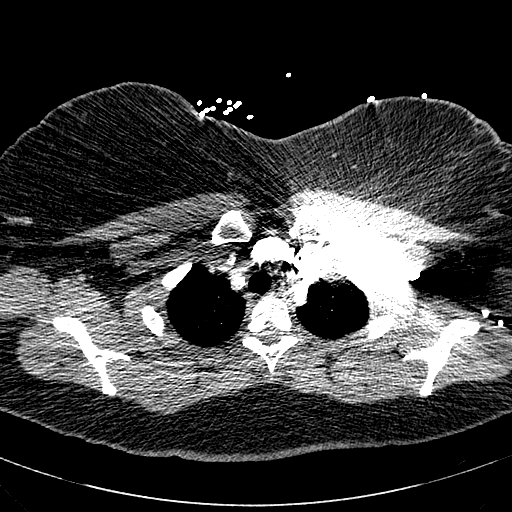
[im 223/237  lung]
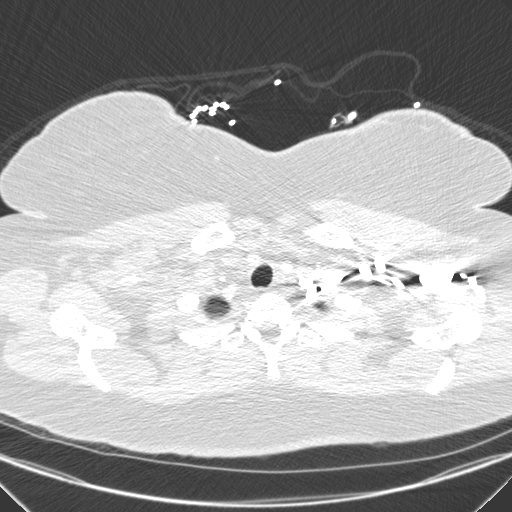

[17 of 36 positions shown; findings below may reference images not displayed]

FINDINGS: Cardiovascular: Heart size within normal limits. No pericardial
effusion. The quantum mottle artifact from patient's large body
habitus. The study is diagnostic. There is no evidence of pulmonary
embolus. No aortic aneurysm or aortic dissection.

Mediastinum/Nodes: No mediastinal hematoma or adenopathy. No hilar
adenopathy.

Lungs/Pleura: Images of the lung parenchyma shows no acute
infiltrate or pleural effusion. No pulmonary edema. No focal
consolidation. No pneumothorax. Central airways are patent. Images
of the thoracic inlet are unremarkable.

Upper Abdomen: The upper abdomen shows no evidence of adrenal gland
mass. Visualized liver tail of the pancreas and spleen is
unremarkable.

Musculoskeletal: No destructive bony lesions are noted. Sagittal
images of the spine shows alignment, disc spaces and vertebral body
heights to be preserved. Sagittal view of the sternum is
unremarkable.

Review of the MIP images confirms the above findings.
IMPRESSION: 1. No pulmonary embolus is noted.
2. No acute infiltrate or pulmonary edema.
3. No mediastinal hematoma or adenopathy.

## 2018-11-17 ENCOUNTER — Other Ambulatory Visit: Payer: Self-pay | Admitting: Sports Medicine

## 2018-11-17 DIAGNOSIS — M25562 Pain in left knee: Secondary | ICD-10-CM

## 2018-12-09 ENCOUNTER — Other Ambulatory Visit: Payer: Self-pay

## 2018-12-09 ENCOUNTER — Ambulatory Visit
Admission: RE | Admit: 2018-12-09 | Discharge: 2018-12-09 | Disposition: A | Discharge status: Home / Self Care | Payer: Medicare Other | Source: Ambulatory Visit | Attending: Sports Medicine | Admitting: Sports Medicine

## 2018-12-09 DIAGNOSIS — M25562 Pain in left knee: Secondary | ICD-10-CM

## 2019-03-23 ENCOUNTER — Other Ambulatory Visit: Payer: Self-pay

## 2019-03-23 DIAGNOSIS — Z20822 Contact with and (suspected) exposure to covid-19: Secondary | ICD-10-CM

## 2019-03-24 LAB — NOVEL CORONAVIRUS, NAA: SARS-CoV-2, NAA: NOT DETECTED

## 2019-10-08 ENCOUNTER — Other Ambulatory Visit: Payer: Self-pay

## 2019-10-08 ENCOUNTER — Emergency Department (HOSPITAL_COMMUNITY)
Admission: EM | Admit: 2019-10-08 | Discharge: 2019-10-09 | Disposition: A | Discharge status: Home / Self Care | Payer: Medicare Other | Attending: Emergency Medicine | Admitting: Emergency Medicine

## 2019-10-08 ENCOUNTER — Encounter (HOSPITAL_COMMUNITY): Payer: Self-pay

## 2019-10-08 ENCOUNTER — Emergency Department (HOSPITAL_COMMUNITY): Payer: Medicare Other

## 2019-10-08 DIAGNOSIS — Z5321 Procedure and treatment not carried out due to patient leaving prior to being seen by health care provider: Secondary | ICD-10-CM | POA: Diagnosis not present

## 2019-10-08 DIAGNOSIS — R079 Chest pain, unspecified: Secondary | ICD-10-CM | POA: Diagnosis not present

## 2019-10-08 DIAGNOSIS — N939 Abnormal uterine and vaginal bleeding, unspecified: Secondary | ICD-10-CM | POA: Diagnosis present

## 2019-10-08 DIAGNOSIS — R0602 Shortness of breath: Secondary | ICD-10-CM | POA: Diagnosis not present

## 2019-10-08 LAB — BASIC METABOLIC PANEL
Anion gap: 13 (ref 5–15)
BUN: 8 mg/dL (ref 6–20)
CO2: 22 mmol/L (ref 22–32)
Calcium: 9.1 mg/dL (ref 8.9–10.3)
Chloride: 105 mmol/L (ref 98–111)
Creatinine, Ser: 0.67 mg/dL (ref 0.44–1.00)
GFR calc Af Amer: >60 mL/min (ref >60)
GFR calc non Af Amer: >60 mL/min (ref >60)
Glucose, Bld: 89 mg/dL (ref 70–99)
Potassium: 3.3 mmol/L — ABNORMAL LOW (ref 3.5–5.1)
Sodium: 140 mmol/L (ref 135–145)

## 2019-10-08 LAB — I-STAT BETA HCG BLOOD, ED (MC, WL, AP ONLY): I-stat hCG, quantitative: <5 m[IU]/mL (ref <5)

## 2019-10-08 LAB — CBC
HCT: 36.7 % (ref 36.0–46.0)
Hemoglobin: 11.4 g/dL — ABNORMAL LOW (ref 12.0–15.0)
MCH: 25.1 pg — ABNORMAL LOW (ref 26.0–34.0)
MCHC: 31.1 g/dL (ref 30.0–36.0)
MCV: 80.8 fL (ref 80.0–100.0)
Platelets: 468 10*3/uL — ABNORMAL HIGH (ref 150–400)
RBC: 4.54 MIL/uL (ref 3.87–5.11)
RDW: 16.7 % — ABNORMAL HIGH (ref 11.5–15.5)
WBC: 10.2 10*3/uL (ref 4.0–10.5)
nRBC: 0 % (ref 0.0–0.2)

## 2019-10-08 LAB — TROPONIN I (HIGH SENSITIVITY): Troponin I (High Sensitivity): 2 ng/L (ref <18)

## 2019-10-08 MED ORDER — SODIUM CHLORIDE 0.9% FLUSH: 3.0000 mL | Freq: Once | INTRAVENOUS | Status: DC

## 2019-10-08 NOTE — ED Notes (Signed)
Called 3X for room placement with no answer. Eloped from waiting room.

## 2019-10-08 NOTE — ED Triage Notes (Signed)
Patient reports that she has been on her period for a few days and states that  She is bleeding more than usual and is having blood clots as well.  Patient c/o mid chest pain and SOB x 1 hour.

## 2019-10-08 NOTE — ED Notes (Signed)
Called from lobby x1 for room in ED w/ no response. 

## 2020-03-04 ENCOUNTER — Other Ambulatory Visit: Payer: Self-pay

## 2020-03-04 ENCOUNTER — Emergency Department (INDEPENDENT_AMBULATORY_CARE_PROVIDER_SITE_OTHER)
Admission: RE | Admit: 2020-03-04 | Discharge: 2020-03-04 | Disposition: A | Discharge status: Home / Self Care | Payer: Medicare Other | Source: Ambulatory Visit

## 2020-03-04 VITALS — BP 115/74 | HR 77 | Temp 97.7°F | Ht 65.0 in | Wt 285.0 lb

## 2020-03-04 DIAGNOSIS — R0789 Other chest pain: Secondary | ICD-10-CM

## 2020-03-04 DIAGNOSIS — J209 Acute bronchitis, unspecified: Secondary | ICD-10-CM

## 2020-03-04 DIAGNOSIS — J4541 Moderate persistent asthma with (acute) exacerbation: Secondary | ICD-10-CM

## 2020-03-04 MED ORDER — BENZONATATE 100 MG PO CAPS
100.0000 mg | ORAL_CAPSULE | Freq: Three times a day (TID) | ORAL | 0 refills | Status: DC
Start: 2020-03-04 — End: 2022-09-24

## 2020-03-04 MED ORDER — PREDNISONE 20 MG PO TABS
40.0000 mg | ORAL_TABLET | Freq: Every day | ORAL | 0 refills | Status: AC
Start: 2020-03-04 — End: 2020-03-09

## 2020-03-04 MED ORDER — CEFDINIR 300 MG PO CAPS
600.0000 mg | ORAL_CAPSULE | Freq: Every day | ORAL | 0 refills | Status: DC
Start: 2020-03-04 — End: 2022-09-24

## 2020-03-04 NOTE — Discharge Instructions (Addendum)
If shortness of breath worsens or chest pain becomes severe, go immediately to the ER.

## 2020-03-04 NOTE — ED Triage Notes (Addendum)
Chest pain x 5 days radiates to upper back, nausea.  has been diagnosed with angina Unvaccinated

## 2020-03-04 NOTE — ED Provider Notes (Signed)
[facility]    CSN: [id number] Arrival date & time: 03/04/20  1527      History   Chief Complaint Chief Complaint  Patient presents with  . Chest Pain    HPI Shannon Aguirre is a 34 y.o. female.   HPI  Patient reports a few weeks of chest, upper back, neck pain.  Patient endorses chest pain as tightness worsens with breathing.  Patient has a history of moderately controlled asthma.  She has had bronchitis consecutively over the last 2 to 3 months.  She reports increased use of her inhaler.  Reports back pain worsens with increased shortness of breath.  She is coughing nonproductively.  Patient is a non-smoker.  Patient has been afebrile.  Patient has a history of Covid dating back to October 2020.  No known sick contacts.  Past Medical History:  Diagnosis Date  . Allergy   . Anemia   . Asthma   . Depression   . Dissociative identity disorder (HCC)   . GERD (gastroesophageal reflux disease)    with pregnancy  . Headache   . Orthostatic hypotension   . PTSD (post-traumatic stress disorder)     Patient Active Problem List   Diagnosis Date Noted  . Atypical chest pain 01/15/2016  . Cough 12/24/2015  . Dyspnea and respiratory abnormality 12/24/2015  . Dizziness and giddiness 12/24/2015  . Gallstone pancreatitis 06/28/2015  . Cesarean delivery, delivered, current hospitalization 05/10/2014  . Rupture of membranes with delay of delivery 05/09/2014  . Asthma, mild persistent 03/16/2014  . Calf pain 03/11/2014  . PTSD (post-traumatic stress disorder) 10/14/2012  . Bipolar disorder, unspecified (HCC) 10/14/2012  . Dissociative identity disorder [facility]) 10/14/2012    Past Surgical History:  Procedure Laterality Date  . BREAST SURGERY  x 2   breast reduction and lumpectomy  . CESAREAN SECTION N/A 05/10/2014   Procedure: CESAREAN SECTION;  Surgeon: Freddrick March. Tenny Craw, MD;  Location: [facility];  Service: Obstetrics;  Laterality: N/A;  . CESAREAN SECTION N/A 06/05/2015    Procedure: CESAREAN SECTION;  Surgeon: Waynard Reeds, MD;  Location: [facility];  Service: Obstetrics;  Laterality: N/A;  . CHOLECYSTECTOMY N/A 06/29/2015   Procedure: LAPAROSCOPIC CHOLECYSTECTOMY WITH INTRAOPERATIVE CHOLANGIOGRAM;  Surgeon: Gaynelle Adu, MD;  Location: [facility] OR;  Service: General;  Laterality: N/A;    OB History    Gravida  2   Para  2   Term  2   Preterm      AB      Living  2     SAB      TAB      Ectopic      Multiple  0   Live Births  2            Home Medications    Prior to Admission medications   Medication Sig Start Date End Date Taking? Authorizing Provider  acetaminophen (TYLENOL) 325 MG tablet Take 2 tablets (650 mg total) by mouth every 6 (six) hours as needed (You can only take 4000 mg of acetaminophen per day (Tylenol)  it is in your pain medicine so count the amout you take each day.  Don't go over 4000 mg.). 06/30/15   Sherrie George, PA-C  AIMOVIG 140 MG/ML SOAJ Inject 140 mg into the skin every 30 (thirty) days. 12/26/17   [provider]  albuterol (PROVENTIL HFA;VENTOLIN HFA) 108 (90 BASE) MCG/ACT inhaler Inhale 2 puffs into the lungs every 6 (six) hours as needed for wheezing.  [provider]  albuterol (PROVENTIL) (2.5 MG/3ML) 0.083% nebulizer solution Take 2.5 mg by nebulization every 6 (six) hours as needed for wheezing or shortness of breath.    [provider]  cetirizine (ZYRTEC) 10 MG tablet Take 10 mg by mouth daily.    [provider]  midodrine (PROAMATINE) 10 MG tablet Take 1 tablet (10 mg total) by mouth 3 (three) times daily. 12/29/17   Derwood Kaplan, MD  omeprazole (PRILOSEC) 20 MG capsule Take 20 mg by mouth daily.  08/12/15   [provider]  ondansetron (ZOFRAN ODT) 8 MG disintegrating tablet Take 1 tablet (8 mg total) by mouth every 8 (eight) hours as needed for nausea. 12/29/17   Derwood Kaplan, MD  topiramate (TOPAMAX) 25 MG tablet Take 75 mg by mouth 2 (two) times daily.   08/14/15   [provider]    Family History Family History  Problem Relation Age of Onset  . Bipolar disorder Mother   . Asthma Mother   . Cancer Father   . Sickle cell anemia Brother   . Breast cancer Maternal Grandmother   . Diabetes Maternal Grandmother   . Diabetes Maternal Grandfather   . Asthma Sister   . Asthma Other        nephew  . COPD Maternal Aunt     Social History Social History   Tobacco Use  . Smoking status: Former Smoker    Packs/day: 1.00    Years: 8.00    Pack years: 8.00    Types: Cigarettes    Quit date: 09/09/2013    Years since quitting: 6.4  . Smokeless tobacco: Never Used  Vaping Use  . Vaping Use: Never used  Substance Use Topics  . Alcohol use: Yes  . Drug use: No     Allergies   Nsaids, Phentermine, and Soybean-containing drug products   Review of Systems Review of Systems Pertinent negatives listed in HPI  Physical Exam Triage Vital Signs ED Triage Vitals  Enc Vitals Group     BP 03/04/20 1542 115/74     Pulse Rate 03/04/20 1542 77     Resp --      Temp 03/04/20 1542 97.7 F (36.5 C)     Temp Source 03/04/20 1542 Oral     SpO2 03/04/20 1542 97 %     Weight 03/04/20 1544 285 lb (129.3 kg)     Height 03/04/20 1544 5\' 5"  (1.651 m)     Head Circumference --      Peak Flow --      Pain Score 03/04/20 1543 8     Pain Loc --      Pain Edu? --      Excl. in GC? --    No data found.  Updated Vital Signs BP 115/74 (BP Location: Right Arm)   Pulse 77   Temp 97.7 F (36.5 C) (Oral)   Ht 5\' 5"  (1.651 m)   Wt 285 lb (129.3 kg)   LMP 01/17/2020 (Approximate) Comment: just coming off bcp, in same sex relationship  SpO2 97%   BMI 47.43 kg/m   Visual Acuity Right Eye Distance:   Left Eye Distance:   Bilateral Distance:    Right Eye Near:   Left Eye Near:    Bilateral Near:     Physical Exam Constitutional:      Appearance: She is obese.  HENT:     Head: Normocephalic.     Nose: Congestion present.   Cardiovascular:  Rate and Rhythm: Normal rate and regular rhythm.  Pulmonary:     Breath sounds: Rhonchi present.     Comments: Coarse lung sounds noted on auscultation. Expiratory wheeze present. Musculoskeletal:        General: Normal range of motion.     Cervical back: Full passive range of motion without pain and normal range of motion.  Skin:    General: Skin is warm.     Capillary Refill: Capillary refill takes less than 2 seconds.  Neurological:     Mental Status: She is alert.  Psychiatric:        Mood and Affect: Mood normal.        Behavior: Behavior normal.      UC Treatments / Results  Labs (all labs ordered are listed, but only abnormal results are displayed) Labs Reviewed - No data to display  EKG Normal sinus rhythm, no ST changes present,   Radiology No results found.  Procedures Procedures (including critical care time)  Medications Ordered in UC Medications - No data to display  Initial Impression / Assessment and Plan / UC Course  I have reviewed the triage vital signs and the nursing notes.  Pertinent labs & imaging results that were available during my care of the patient were reviewed by me and considered in my medical decision making (see chart for details).    EKG NSR, Rate 73, normal EKG.  Suspect symptoms of chest tightness and upper back pain are related to current asthma exacerbation/bronchitis.  Will treat with a short course of prednisone, Tessalon Perles for cough, Omnicef.  Patient advised to take Tylenol as needed for achiness.  Red flags discussed that warrant immediate follow-up at the ER.  Patient verbalized understanding and agreement with plan. Final Clinical Impressions(s) / UC Diagnoses   Final diagnoses:  Atypical chest pain  Acute bronchitis, unspecified organism  Moderate persistent asthma with acute exacerbation     Discharge Instructions     If shortness of breath worsens or chest pain becomes severe, go  immediately to the ER.    ED Prescriptions    Medication Sig Dispense Auth. Provider   predniSONE (DELTASONE) 20 MG tablet Take 2 tablets (40 mg total) by mouth daily with breakfast for 5 days. 10 tablet Bing Neighbors, FNP   benzonatate (TESSALON) 100 MG capsule Take 1 capsule (100 mg total) by mouth every 8 (eight) hours. 21 capsule Bing Neighbors, FNP   cefdinir (OMNICEF) 300 MG capsule Take 2 capsules (600 mg total) by mouth daily. 20 capsule Bing Neighbors, FNP     PDMP not reviewed this encounter.   Bing Neighbors, FNP 03/07/20 [phone]

## 2020-10-31 ENCOUNTER — Other Ambulatory Visit: Payer: Self-pay

## 2020-10-31 ENCOUNTER — Encounter (HOSPITAL_COMMUNITY): Payer: Self-pay

## 2020-10-31 ENCOUNTER — Emergency Department (HOSPITAL_COMMUNITY)
Admission: EM | Admit: 2020-10-31 | Discharge: 2020-10-31 | Disposition: A | Discharge status: Home / Self Care | Payer: Medicare Other | Attending: Emergency Medicine | Admitting: Emergency Medicine

## 2020-10-31 DIAGNOSIS — J4530 Mild persistent asthma, uncomplicated: Secondary | ICD-10-CM | POA: Diagnosis not present

## 2020-10-31 DIAGNOSIS — Z87891 Personal history of nicotine dependence: Secondary | ICD-10-CM | POA: Insufficient documentation

## 2020-10-31 DIAGNOSIS — M79605 Pain in left leg: Secondary | ICD-10-CM | POA: Insufficient documentation

## 2020-10-31 LAB — CBC WITH DIFFERENTIAL/PLATELET
Abs Immature Granulocytes: 0.02 10*3/uL (ref 0.00–0.07)
Basophils Absolute: 0.1 10*3/uL (ref 0.0–0.1)
Basophils Relative: 1 %
Eosinophils Absolute: 0.2 10*3/uL (ref 0.0–0.5)
Eosinophils Relative: 2 %
HCT: 32.5 % — ABNORMAL LOW (ref 36.0–46.0)
Hemoglobin: 10.2 g/dL — ABNORMAL LOW (ref 12.0–15.0)
Immature Granulocytes: 0 %
Lymphocytes Relative: 39 %
Lymphs Abs: 3.3 10*3/uL (ref 0.7–4.0)
MCH: 26.2 pg (ref 26.0–34.0)
MCHC: 31.4 g/dL (ref 30.0–36.0)
MCV: 83.5 fL (ref 80.0–100.0)
Monocytes Absolute: 0.5 10*3/uL (ref 0.1–1.0)
Monocytes Relative: 6 %
Neutro Abs: 4.5 10*3/uL (ref 1.7–7.7)
Neutrophils Relative %: 52 %
Platelets: 349 10*3/uL (ref 150–400)
RBC: 3.89 MIL/uL (ref 3.87–5.11)
RDW: 15.3 % (ref 11.5–15.5)
WBC: 8.5 10*3/uL (ref 4.0–10.5)
nRBC: 0 % (ref 0.0–0.2)

## 2020-10-31 LAB — BASIC METABOLIC PANEL
Anion gap: 4 — ABNORMAL LOW (ref 5–15)
BUN: 10 mg/dL (ref 6–20)
CO2: 29 mmol/L (ref 22–32)
Calcium: 8.9 mg/dL (ref 8.9–10.3)
Chloride: 105 mmol/L (ref 98–111)
Creatinine, Ser: 0.67 mg/dL (ref 0.44–1.00)
GFR, Estimated: >60 mL/min (ref >60)
Glucose, Bld: 91 mg/dL (ref 70–99)
Potassium: 4 mmol/L (ref 3.5–5.1)
Sodium: 138 mmol/L (ref 135–145)

## 2020-10-31 LAB — D-DIMER, QUANTITATIVE: D-Dimer, Quant: 0.56 ug/mL-FEU — ABNORMAL HIGH (ref 0.00–0.50)

## 2020-10-31 MED ORDER — ENOXAPARIN SODIUM 150 MG/ML IJ SOSY
1.0000 mg/kg | PREFILLED_SYRINGE | Freq: Once | INTRAMUSCULAR | Status: AC
  Administered 2020-10-31: 126 mg via SUBCUTANEOUS
  Filled 2020-10-31: qty 0.84

## 2020-10-31 NOTE — ED Triage Notes (Signed)
Patient reports that she began having burning in the right lower leg for a few weeks, but now includes the right calf area. Patient also reports slight swelling.

## 2020-10-31 NOTE — Discharge Instructions (Signed)
You should be contacted tomorrow with instructions to come get your ultrasound.  If you not hear from them early in the morning come to either [facility] or [facility] for your ultrasound.  You have been given a blood thinner tonight.  He started experience any chest pain shortness of breath come back before tomorrow.  This blood thinner increases your risk of easy bleeding or bruising.  If you have any trauma seek medical attention immediately

## 2020-10-31 NOTE — ED Provider Notes (Signed)
[facility]-EMERGENCY DEPT Provider Note   CSN: [id number] Arrival date & time: 10/31/20  1833     History Chief Complaint  Patient presents with  . Leg Pain    Adrean Findlay is a 35 y.o. female.  Left-sided leg pain, swelling.  Sometimes color change.  Waxing and waning for days.  No medications taken.  No history of clots.  No history of cancer.  No history of estrogen therapy.  No history of trauma no recent infection.  Pain is mild to moderate.  No fevers chills.   Leg Pain Associated symptoms: no back pain and no fever        Past Medical History:  Diagnosis Date  . Allergy   . Anemia   . Asthma   . Depression   . Dissociative identity disorder (HCC)   . GERD (gastroesophageal reflux disease)    with pregnancy  . Headache   . Orthostatic hypotension   . PTSD (post-traumatic stress disorder)     Patient Active Problem List   Diagnosis Date Noted  . Atypical chest pain 01/15/2016  . Cough 12/24/2015  . Dyspnea and respiratory abnormality 12/24/2015  . Dizziness and giddiness 12/24/2015  . Gallstone pancreatitis 06/28/2015  . Cesarean delivery, delivered, current hospitalization 05/10/2014  . Rupture of membranes with delay of delivery 05/09/2014  . Asthma, mild persistent 03/16/2014  . Calf pain 03/11/2014  . PTSD (post-traumatic stress disorder) 10/14/2012  . Bipolar disorder, unspecified (HCC) 10/14/2012  . Dissociative identity disorder [facility]) 10/14/2012    Past Surgical History:  Procedure Laterality Date  . BREAST SURGERY  x 2   breast reduction and lumpectomy  . CESAREAN SECTION N/A 05/10/2014   Procedure: CESAREAN SECTION;  Surgeon: Freddrick March. Tenny Craw, MD;  Location: [facility];  Service: Obstetrics;  Laterality: N/A;  . CESAREAN SECTION N/A 06/05/2015   Procedure: CESAREAN SECTION;  Surgeon: Waynard Reeds, MD;  Location: [facility];  Service: Obstetrics;  Laterality: N/A;  . CHOLECYSTECTOMY N/A 06/29/2015   Procedure: LAPAROSCOPIC  CHOLECYSTECTOMY WITH INTRAOPERATIVE CHOLANGIOGRAM;  Surgeon: Gaynelle Adu, MD;  Location: [facility];  Service: General;  Laterality: N/A;     OB History    Gravida  2   Para  2   Term  2   Preterm      AB      Living  2     SAB      IAB      Ectopic      Multiple  0   Live Births  2           Family History  Problem Relation Age of Onset  . Bipolar disorder Mother   . Asthma Mother   . Cancer Father   . Sickle cell anemia Brother   . Breast cancer Maternal Grandmother   . Diabetes Maternal Grandmother   . Diabetes Maternal Grandfather   . Asthma Sister   . Asthma Other        nephew  . COPD Maternal Aunt     Social History   Tobacco Use  . Smoking status: Former Smoker    Packs/day: 1.00    Years: 8.00    Pack years: 8.00    Types: Cigarettes    Quit date: 09/09/2013    Years since quitting: 7.1  . Smokeless tobacco: Never Used  Vaping Use  . Vaping Use: Never used  Substance Use Topics  . Alcohol use: Yes  . Drug use: No  Home Medications Prior to Admission medications   Medication Sig Start Date End Date Taking? Authorizing Provider  acetaminophen (TYLENOL) 500 MG tablet Take 1,000 mg by mouth every 6 (six) hours as needed.   Yes [provider]  albuterol (PROVENTIL HFA;VENTOLIN HFA) 108 (90 BASE) MCG/ACT inhaler Inhale 2 puffs into the lungs every 6 (six) hours as needed for wheezing.   Yes [provider]  albuterol (PROVENTIL) (2.5 MG/3ML) 0.083% nebulizer solution Take 2.5 mg by nebulization every 6 (six) hours as needed for wheezing or shortness of breath.   Yes [provider]  budesonide-formoterol (SYMBICORT) 160-4.5 MCG/ACT inhaler Inhale 2 puffs into the lungs 2 (two) times daily.   Yes [provider]  cetirizine (ZYRTEC) 10 MG tablet Take 10 mg by mouth daily.   Yes [provider]  Multiple Vitamins-Minerals (MULTIVITAMIN WOMEN) TABS Take 1 tablet by mouth daily.   Yes [provider]  omeprazole (PRILOSEC) 20 MG capsule Take 20 mg by mouth daily.  08/12/15  Yes [provider]  Tetrahydroz-Dextran-PEG-Povid (VISINE ADVANCED RELIEF OP) Place 1 drop into both eyes daily as needed (allergy).   Yes [provider]  acetaminophen (TYLENOL) 325 MG tablet Take 2 tablets (650 mg total) by mouth every 6 (six) hours as needed (You can only take 4000 mg of acetaminophen per day (Tylenol)  it is in your pain medicine so count the amout you take each day.  Don't go over 4000 mg.). Patient not taking: Reported on 10/31/2020 06/30/15   Sherrie George, PA-C  benzonatate (TESSALON) 100 MG capsule Take 1 capsule (100 mg total) by mouth every 8 (eight) hours. Patient not taking: Reported on 10/31/2020 03/04/20   Bing Neighbors, FNP  cefdinir (OMNICEF) 300 MG capsule Take 2 capsules (600 mg total) by mouth daily. Patient not taking: Reported on 10/31/2020 03/04/20   Bing Neighbors, FNP  midodrine (PROAMATINE) 10 MG tablet Take 1 tablet (10 mg total) by mouth 3 (three) times daily. Patient not taking: Reported on 10/31/2020 12/29/17   Derwood Kaplan, MD  ondansetron (ZOFRAN ODT) 8 MG disintegrating tablet Take 1 tablet (8 mg total) by mouth every 8 (eight) hours as needed for nausea. Patient not taking: Reported on 10/31/2020 12/29/17   Derwood Kaplan, MD  topiramate (TOPAMAX) 25 MG tablet Take 75 mg by mouth 2 (two) times daily.  Patient not taking: Reported on 10/31/2020 08/14/15   [provider]    Allergies    Nsaids, Phentermine, and Soybean-containing drug products  Review of Systems   Review of Systems  Constitutional: Negative for chills and fever.  HENT: Negative for congestion and rhinorrhea.   Respiratory: Negative for cough and shortness of breath.   Cardiovascular: Positive for leg swelling. Negative for chest pain and palpitations.  Gastrointestinal: Negative for diarrhea, nausea and vomiting.  Genitourinary: Negative for difficulty  urinating and dysuria.  Musculoskeletal: Positive for myalgias. Negative for arthralgias and back pain.  Skin: Negative for rash and wound.  Neurological: Negative for light-headedness and headaches.    Physical Exam Updated Vital Signs BP 137/75   Pulse (!) 59   Temp 98.1 F (36.7 C) (Oral)   Resp 18   Ht 5\' 5"  (1.651 m)   Wt 125.6 kg   LMP 10/27/2020   SpO2 100%   BMI 46.10 kg/m   Physical Exam Vitals and nursing note reviewed. Exam conducted with a chaperone present.  Constitutional:      General: She is not in acute distress.  Appearance: Normal appearance.  HENT:     Head: Normocephalic and atraumatic.     Nose: No rhinorrhea.  Eyes:     General:        Right eye: No discharge.        Left eye: No discharge.     Conjunctiva/sclera: Conjunctivae normal.  Cardiovascular:     Rate and Rhythm: Normal rate and regular rhythm.  Pulmonary:     Effort: Pulmonary effort is normal. No respiratory distress.     Breath sounds: No stridor. No wheezing or rales.  Abdominal:     General: Abdomen is flat. There is no distension.     Palpations: Abdomen is soft.  Musculoskeletal:        General: No tenderness or signs of injury.     Right lower leg: No edema.     Left lower leg: No edema.     Comments: No color change, no mass, no tenderness, normal range of motion neurovascular intact in bilateral lower extremities  Skin:    General: Skin is warm and dry.  Neurological:     General: No focal deficit present.     Mental Status: She is alert. Mental status is at baseline.     Motor: No weakness.  Psychiatric:        Mood and Affect: Mood normal.        Behavior: Behavior normal.     ED Results / Procedures / Treatments   Labs (all labs ordered are listed, but only abnormal results are displayed) Labs Reviewed  D-DIMER, QUANTITATIVE - Abnormal; Notable for the following components:      Result Value   D-Dimer, Quant 0.56 (*)    All other components within normal  limits  CBC WITH DIFFERENTIAL/PLATELET - Abnormal; Notable for the following components:   Hemoglobin 10.2 (*)    HCT 32.5 (*)    All other components within normal limits  BASIC METABOLIC PANEL    EKG None  Radiology No results found.  Procedures Procedures   Medications Ordered in ED Medications  enoxaparin (LOVENOX) injection 126 mg (has no administration in time range)    ED Course  I have reviewed the triage vital signs and the nursing notes.  Pertinent labs & imaging results that were available during my care of the patient were reviewed by me and considered in my medical decision making (see chart for details).    MDM Rules/Calculators/A&P                          Concern for DVT will get ultrasound.  No risk factors.  Overall well-appearing.  Unable to get DVT study at this time.  We will give Lovenox one-time dose and have her come back tomorrow for ultrasound.   Final Clinical Impression(s) / ED Diagnoses Final diagnoses:  Left leg pain    Rx / DC Orders ED Discharge Orders    None       Sabino Donovan, MD 10/31/20 2221

## 2020-11-08 ENCOUNTER — Emergency Department (INDEPENDENT_AMBULATORY_CARE_PROVIDER_SITE_OTHER)
Admission: EM | Admit: 2020-11-08 | Discharge: 2020-11-08 | Disposition: A | Discharge status: Home / Self Care | Payer: Medicare Other | Source: Home / Self Care

## 2020-11-08 ENCOUNTER — Encounter: Payer: Self-pay | Admitting: Emergency Medicine

## 2020-11-08 ENCOUNTER — Other Ambulatory Visit: Payer: Self-pay

## 2020-11-08 DIAGNOSIS — J4521 Mild intermittent asthma with (acute) exacerbation: Secondary | ICD-10-CM

## 2020-11-08 DIAGNOSIS — J988 Other specified respiratory disorders: Secondary | ICD-10-CM

## 2020-11-08 DIAGNOSIS — Z1152 Encounter for screening for COVID-19: Secondary | ICD-10-CM

## 2020-11-08 DIAGNOSIS — R509 Fever, unspecified: Secondary | ICD-10-CM

## 2020-11-08 MED ORDER — METHYLPREDNISOLONE ACETATE 80 MG/ML IJ SUSP
80.0000 mg | Freq: Once | INTRAMUSCULAR | Status: AC
  Administered 2020-11-08: 80 mg via INTRAMUSCULAR

## 2020-11-08 MED ORDER — ACETAMINOPHEN 500 MG PO TABS
1000.0000 mg | ORAL_TABLET | ORAL | Status: AC
  Administered 2020-11-08: 1000 mg via ORAL

## 2020-11-08 NOTE — ED Triage Notes (Signed)
Fever since yesterday  Negative home COVID test yesterday - concerned about asthma or bronchitis  Tylenol at 0600 (1000mg )  Pt's son also sick  No COVID vaccine  COVID 1/22

## 2020-11-08 NOTE — ED Provider Notes (Signed)
[facility]    CSN: [id number] Arrival date & time: 11/08/20  1211      History   Chief Complaint Chief Complaint  Patient presents with   Fever    HPI Shannon Aguirre is a 35 y.o. female.   HPI 35 year old female presents with fever since yesterday, reports negative COVID-19 home test.  Patient reports is concerned about bronchitis or asthma.  Past Medical History:  Diagnosis Date   Allergy    Anemia    Asthma    Depression    Dissociative identity disorder [facility])    GERD (gastroesophageal reflux disease)    with pregnancy   Headache    Orthostatic hypotension    PTSD (post-traumatic stress disorder)     Patient Active Problem List   Diagnosis Date Noted   Atypical chest pain 01/15/2016   Cough 12/24/2015   Dyspnea and respiratory abnormality 12/24/2015   Dizziness and giddiness 12/24/2015   Gallstone pancreatitis 06/28/2015   Cesarean delivery, delivered, current hospitalization 05/10/2014   Rupture of membranes with delay of delivery 05/09/2014   Asthma, mild persistent 03/16/2014   Calf pain 03/11/2014   PTSD (post-traumatic stress disorder) 10/14/2012   Bipolar disorder, unspecified (HCC) 10/14/2012   Dissociative identity disorder (HCC) 10/14/2012    Past Surgical History:  Procedure Laterality Date   BREAST SURGERY  x 2   breast reduction and lumpectomy   CESAREAN SECTION N/A 05/10/2014   Procedure: CESAREAN SECTION;  Surgeon: Freddrick March. Tenny Craw, MD;  Location: [facility];  Service: Obstetrics;  Laterality: N/A;   CESAREAN SECTION N/A 06/05/2015   Procedure: CESAREAN SECTION;  Surgeon: Waynard Reeds, MD;  Location: [facility];  Service: Obstetrics;  Laterality: N/A;   CHOLECYSTECTOMY N/A 06/29/2015   Procedure: LAPAROSCOPIC CHOLECYSTECTOMY WITH INTRAOPERATIVE CHOLANGIOGRAM;  Surgeon: Gaynelle Adu, MD;  Location: [facility];  Service: General;  Laterality: N/A;    OB History     Gravida  2   Para  2   Term  2   Preterm      AB      Living  2       SAB      IAB      Ectopic      Multiple  0   Live Births  2            Home Medications    Prior to Admission medications   Medication Sig Start Date End Date Taking? Authorizing Provider  acetaminophen (TYLENOL) 500 MG tablet Take 1,000 mg by mouth every 6 (six) hours as needed.   Yes [provider]  budesonide-formoterol (SYMBICORT) 160-4.5 MCG/ACT inhaler Inhale 2 puffs into the lungs 2 (two) times daily.   Yes [provider]  Multiple Vitamins-Minerals (MULTIVITAMIN WOMEN) TABS Take 1 tablet by mouth daily.   Yes [provider]  omeprazole (PRILOSEC) 20 MG capsule Take 20 mg by mouth daily.  08/12/15  Yes [provider]  acetaminophen (TYLENOL) 325 MG tablet Take 2 tablets (650 mg total) by mouth every 6 (six) hours as needed (You can only take 4000 mg of acetaminophen per day (Tylenol)  it is in your pain medicine so count the amout you take each day.  Don't go over 4000 mg.). Patient not taking: No sig reported 06/30/15   Sherrie George, PA-C  albuterol (PROVENTIL HFA;VENTOLIN HFA) 108 (90 BASE) MCG/ACT inhaler Inhale 2 puffs into the lungs every 6 (six) hours as needed for wheezing.    [provider]  albuterol (PROVENTIL) (2.5 MG/3ML) 0.083% nebulizer solution Take 2.5 mg by nebulization every 6 (six) hours as needed for wheezing or shortness of breath.    [provider]  benzonatate (TESSALON) 100 MG capsule Take 1 capsule (100 mg total) by mouth every 8 (eight) hours. Patient not taking: No sig reported 03/04/20   Bing Neighbors, FNP  cefdinir (OMNICEF) 300 MG capsule Take 2 capsules (600 mg total) by mouth daily. Patient not taking: No sig reported 03/04/20   Bing Neighbors, FNP  cetirizine (ZYRTEC) 10 MG tablet Take 10 mg by mouth daily.    [provider]  midodrine (PROAMATINE) 10 MG tablet Take 1 tablet (10 mg total) by mouth 3 (three) times daily. Patient not taking: No sig reported 12/29/17    Derwood Kaplan, MD  ondansetron (ZOFRAN ODT) 8 MG disintegrating tablet Take 1 tablet (8 mg total) by mouth every 8 (eight) hours as needed for nausea. Patient not taking: No sig reported 12/29/17   Derwood Kaplan, MD  Tetrahydroz-Dextran-PEG-Povid Lurlean Horns ADVANCED RELIEF OP) Place 1 drop into both eyes daily as needed (allergy).    [provider]  topiramate (TOPAMAX) 25 MG tablet Take 75 mg by mouth 2 (two) times daily.  Patient not taking: Reported on 10/31/2020 08/14/15   [provider]    Family History Family History  Problem Relation Age of Onset   Bipolar disorder Mother    Asthma Mother    Cancer Father    Sickle cell anemia Brother    Breast cancer Maternal Grandmother    Diabetes Maternal Grandmother    Diabetes Maternal Grandfather    Asthma Sister    Asthma Other        nephew   COPD Maternal Aunt     Social History Social History   Tobacco Use   Smoking status: Former    Packs/day: 1.00    Years: 8.00    Pack years: 8.00    Types: Cigarettes    Quit date: 09/09/2013    Years since quitting: 7.1   Smokeless tobacco: Never  Vaping Use   Vaping Use: Never used  Substance Use Topics   Alcohol use: Yes   Drug use: No     Allergies   Nsaids, Phentermine, and Soybean-containing drug products   Review of Systems Review of Systems  Constitutional:  Positive for fever.  HENT:  Positive for congestion.   Eyes: Negative.   Respiratory:  Positive for cough.   Gastrointestinal: Negative.   Genitourinary: Negative.   Musculoskeletal: Negative.   Skin: Negative.   Neurological: Negative.     Physical Exam Triage Vital Signs ED Triage Vitals [11/08/20 1327]  Enc Vitals Group     BP 118/75     Pulse Rate (!) 110     Resp 17     Temp (!) 103.2 F (39.6 C)     Temp Source Oral     SpO2 97 %     Weight      Height      Head Circumference      Peak Flow      Pain Score      Pain Loc      Pain Edu?      Excl. in GC?    No data  found.  Updated Vital Signs BP 118/75 (BP Location: Right Arm)   Pulse (!) 110   Temp (!) 102.1 F (38.9 C) (Tympanic)   Resp 17   LMP 10/27/2020  SpO2 97%      Physical Exam Vitals and nursing note reviewed.  Constitutional:      General: She is not in acute distress.    Appearance: Normal appearance. She is obese. She is ill-appearing.  HENT:     Head: Normocephalic and atraumatic.     Right Ear: Tympanic membrane and ear canal normal.     Left Ear: Tympanic membrane and ear canal normal.     Mouth/Throat:     Mouth: Mucous membranes are moist.     Pharynx: Oropharynx is clear.  Eyes:     Extraocular Movements: Extraocular movements intact.     Conjunctiva/sclera: Conjunctivae normal.     Pupils: Pupils are equal, round, and reactive to light.  Cardiovascular:     Rate and Rhythm: Regular rhythm. Tachycardia present.     Pulses: Normal pulses.     Heart sounds: Normal heart sounds. No murmur heard. Pulmonary:     Effort: Pulmonary effort is normal. No respiratory distress.     Breath sounds: Normal breath sounds. No wheezing, rhonchi or rales.  Musculoskeletal:        General: Normal range of motion.     Cervical back: Normal range of motion and neck supple. Tenderness present.  Lymphadenopathy:     Cervical: Cervical adenopathy present.  Skin:    General: Skin is warm and dry.  Neurological:     General: No focal deficit present.     Mental Status: She is alert and oriented to person, place, and time.  Psychiatric:        Mood and Affect: Mood normal.        Behavior: Behavior normal.     UC Treatments / Results  Labs (all labs ordered are listed, but only abnormal results are displayed) Labs Reviewed  COVID-19, FLU A+B NAA  POC SARS CORONAVIRUS 2 AG -  ED    EKG   Radiology No results found.  Procedures Procedures (including critical care time)  Medications Ordered in UC Medications  acetaminophen (TYLENOL) tablet 1,000 mg (1,000 mg Oral  Given 11/08/20 1339)  methylPREDNISolone acetate (DEPO-MEDROL) injection 80 mg (80 mg Intramuscular Given 11/08/20 1436)    Initial Impression / Assessment and Plan / UC Course  I have reviewed the triage vital signs and the nursing notes.  Pertinent labs & imaging results that were available during my care of the patient were reviewed by me and considered in my medical decision making (see chart for details).   MDM: 1.  Viral respiratory illness, 2.  Mild intermittent asthma, 3.  Fever.  Patient discharged home, hemodynamically stable   Final Clinical Impressions(s) / UC Diagnoses   Final diagnoses:  Viral respiratory illness  Mild intermittent asthma with acute exacerbation  Fever, unspecified     Discharge Instructions      Advised patient to use her previously prescribed Symbicort rather than albuterol rescue inhaler or albuterol nebulizer for the next 2 to 3 days.     ED Prescriptions   None    PDMP not reviewed this encounter.   Trevor Iha, FNP 11/08/20 1444

## 2020-11-08 NOTE — Discharge Instructions (Addendum)
Advised patient to use her previously prescribed Symbicort rather than albuterol rescue inhaler or albuterol nebulizer for the next 2 to 3 days.

## 2020-11-09 ENCOUNTER — Telehealth: Payer: Self-pay | Admitting: Emergency Medicine

## 2020-11-09 LAB — COVID-19, FLU A+B NAA
Influenza A, NAA: DETECTED — AB
Influenza B, NAA: NOT DETECTED
SARS-CoV-2, NAA: NOT DETECTED

## 2020-11-09 MED ORDER — PREDNISONE 10 MG PO TABS: ORAL_TABLET | ORAL | 0 refills | Status: DC

## 2020-11-09 NOTE — Telephone Encounter (Signed)
Call back to [place] after speaking with provider here today. Pt to stop spiriva while on oral steroids - script sent today. Pt to start today. May use albuterol as prescribed while on oral prednisone. Restart Spiriva once you are done w/ oral prednisone. Pt to pick up meds tonight  & start 1st dose tonight. Pt verbalized an understanding that if SOB increases she needs to go to the Emergency department. Flu quarantine guidelines reviewed with pt.

## 2020-11-09 NOTE — Progress Notes (Signed)
Patient called regarding positive Flu results are positive for Flu A. Covid test is negative. Pt stated she is still coughing, confirmed she is using her symbicort inhaler & not using her albuterol inhaler. Flu results reviewed w/ provider here, Jennings Books, FNP. See telephone note for oral steroids.

## 2022-09-24 ENCOUNTER — Encounter: Payer: Self-pay | Admitting: Internal Medicine

## 2022-09-24 ENCOUNTER — Ambulatory Visit: Payer: Medicare HMO | Attending: Internal Medicine | Admitting: Internal Medicine

## 2022-09-24 VITALS — BP 108/67 | HR 72 | Temp 97.8°F | Ht 65.0 in

## 2022-09-24 DIAGNOSIS — M25571 Pain in right ankle and joints of right foot: Secondary | ICD-10-CM

## 2022-09-24 DIAGNOSIS — R208 Other disturbances of skin sensation: Secondary | ICD-10-CM

## 2022-09-24 DIAGNOSIS — Z800 Family history of malignant neoplasm of digestive organs: Secondary | ICD-10-CM | POA: Diagnosis not present

## 2022-09-24 DIAGNOSIS — Z7689 Persons encountering health services in other specified circumstances: Secondary | ICD-10-CM

## 2022-09-24 DIAGNOSIS — G8929 Other chronic pain: Secondary | ICD-10-CM | POA: Diagnosis not present

## 2022-09-24 NOTE — Progress Notes (Signed)
Patient ID: Shannon Aguirre, female    DOB: 11-Jan-1986  MRN: [id number]  CC: Establish Care (Est care / new pt. Med refill - Zyrtec/Pain on R ankle, tightness, swelling X1 week/Pt has pap appt scheduled next week.)   Subjective: Shannon Aguirre is a 37 y.o. female who presents for new pt visit Her concerns today include:  Patient with history of asthma, PTSD, bipolar disorder, former smoker, dissociative identity disorder (pt confirms all)  Previous PCP: Dr. Ladora Daniel, [facility]. Change due to insurance change  Main concern today is swelling and tightness in RT ankle that initially started 02/2022 without any injury. Seen by previous PCP for same in Oct 2023; referred to ortho.  X-ray findings were as copied from Care Everywhere: FINDINGS:  Bones: Very small nondisplaced periosteal avulsion fracture of the lateral malleolus. Bones are otherwise normal.  Joints: Congruent without joint space narrowing, subluxation, or erosion.  Soft tissues: Mild lateral soft tissue swelling. Small dystrophic calcification in the distal lateral lower leg. No radiopaque foreign bodies.   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 Placed in boot for a mth and referred for some physical therapy. Got better but swelling and tightness returned x 1 wk Job is sedentary.  At the end of the day she has a lot of swelling in the ankle.  She elevates the leg at night and applies ice.  She also sleeps in a compression sleeve that she was given previously.  Not taking any medication. Allergic to NSAIDs.  Throat gets itchy and swells.    Also report an intermittent hot feeling on a certain area of the left lower lateral calf.  Does not last long when it occurs.  Fhx:  Mother dx with colon CA age 74, sister dx with pancreatic cancer at age 25.   HM:  due for pap.  Has pap with GYN 09/28/2022 with [facility]. Showing due  for Medicare Wellness visit but pt states she no longer has [employer].  Has regular BCBS.  Patient Active Problem List   Diagnosis Date Noted   Atypical chest pain 01/15/2016   Cough 12/24/2015   Dyspnea and respiratory abnormality 12/24/2015   Dizziness and giddiness 12/24/2015   Gallstone pancreatitis 06/28/2015   Cesarean delivery, delivered, current hospitalization 05/10/2014   Rupture of membranes with delay of delivery 05/09/2014   Asthma, mild persistent 03/16/2014   Calf pain 03/11/2014   PTSD (post-traumatic stress disorder) 10/14/2012   Bipolar disorder, unspecified (HCC) 10/14/2012   Dissociative identity disorder (HCC) 10/14/2012     Current Outpatient Medications on File Prior to Visit  Medication Sig Dispense Refill   acetaminophen (TYLENOL) 500 MG tablet Take 1,000 mg by mouth every 6 (six) hours as needed.     albuterol (PROVENTIL HFA;VENTOLIN HFA) 108 (90 BASE) MCG/ACT inhaler Inhale 2 puffs into the lungs every 6 (six) hours as needed for wheezing.     albuterol (PROVENTIL) (2.5 MG/3ML) 0.083% nebulizer solution Take 2.5 mg by nebulization every 6 (six) hours as needed for wheezing or shortness of breath.     budesonide-formoterol (SYMBICORT) 160-4.5 MCG/ACT inhaler Inhale 2 puffs into the lungs 2 (two) times daily.     cetirizine (ZYRTEC) 10 MG tablet Take 10 mg by mouth daily.     Multiple Vitamins-Minerals (MULTIVITAMIN WOMEN) TABS Take 1 tablet by mouth daily.     Tetrahydroz-Dextran-PEG-Povid (VISINE ADVANCED RELIEF OP) Place 1 drop into  both eyes daily as needed (allergy).     No current facility-administered medications on file prior to visit.    Allergies  Allergen Reactions   Nsaids Anaphylaxis   Phentermine Palpitations and Other (See Comments)    Other reaction(s): Chest Pain   Soybean-Containing Drug Products Anaphylaxis    Social History   Socioeconomic History   Marital status: Single    Spouse name: Not on file   Number of children: 2    Years of education: Not on file   Highest education level: Not on file  Occupational History   Occupation: Psychologist, forensic  Tobacco Use   Smoking status: Former    Packs/day: 1.00    Years: 8.00    Additional pack years: 0.00    Total pack years: 8.00    Types: Cigarettes    Quit date: 09/09/2013    Years since quitting: 9.0   Smokeless tobacco: Never  Vaping Use   Vaping Use: Never used  Substance and Sexual Activity   Alcohol use: Yes    Comment: Occasionally on weekends   Drug use: No   Sexual activity: Yes    Birth control/protection: None  Other Topics Concern   Not on file  Social History Narrative   Lives with 2 children and a roommate.     Works as a Psychologist, forensic. Education: college.    Social Determinants of Health   Financial Resource Strain: Not on file  Food Insecurity: Not on file  Transportation Needs: Not on file  Physical Activity: Not on file  Stress: Not on file  Social Connections: Not on file  Intimate Partner Violence: Not on file    Family History  Problem Relation Age of Onset   Bipolar disorder Mother    Asthma Mother    Colon cancer Mother    Cancer Father    Asthma Sister    Pancreatic cancer Sister    Sickle cell anemia Brother    COPD Maternal Aunt    Breast cancer Maternal Grandmother    Diabetes Maternal Grandmother    Diabetes Maternal Grandfather    Asthma Other        nephew    Past Surgical History:  Procedure Laterality Date   BREAST SURGERY  x 2   breast reduction BL 07/2007 and lumpectomy LT 2010 benign   CESAREAN SECTION N/A 05/10/2014   Procedure: CESAREAN SECTION;  Surgeon: Freddrick March. Tenny Craw, MD;  Location: [facility];  Service: Obstetrics;  Laterality: N/A;   CESAREAN SECTION N/A 06/05/2015   Procedure: CESAREAN SECTION;  Surgeon: Waynard Reeds, MD;  Location: [facility];  Service: Obstetrics;  Laterality: N/A;   CHOLECYSTECTOMY N/A 06/29/2015   Procedure: LAPAROSCOPIC CHOLECYSTECTOMY WITH INTRAOPERATIVE CHOLANGIOGRAM;   Surgeon: Gaynelle Adu, MD;  Location: [facility];  Service: General;  Laterality: N/A;    ROS: Review of Systems Negative except as stated above  PHYSICAL EXAM: BP 108/67 (BP Location: Left Arm, Patient Position: Sitting, Cuff Size: Large)   Pulse 72   Temp 97.8 F (36.6 C) (Oral)   Ht 5\' 5"  (1.651 m)   LMP 09/12/2022 (Exact Date)   SpO2 98%   BMI 46.10 kg/m   Physical Exam  General appearance - alert, well appearing, obese middle-age female and in no distress Mental status - normal mood, behavior, speech, dress, motor activity, and thought processes Musculoskeletal -right ankle: Noted mild soft tissue edema compared to the left.  Mild tenderness over the lateral malleolus and Achilles tendon.  Discomfort with  inversion.      Latest Ref Rng & Units 10/31/2020    9:21 PM 10/08/2019    4:24 PM 12/28/2017    9:04 PM  CMP  Glucose 70 - 99 mg/dL 91  89  90   BUN 6 - 20 mg/dL 10  8  8    Creatinine 0.44 - 1.00 mg/dL 5.40  9.81  1.91   Sodium 135 - 145 mmol/L 138  140  140   Potassium 3.5 - 5.1 mmol/L 4.0  3.3  3.5   Chloride 98 - 111 mmol/L 105  105  111   CO2 22 - 32 mmol/L 29  22  18    Calcium 8.9 - 10.3 mg/dL 8.9  9.1  9.1    Lipid Panel  No results found for: "CHOL", "TRIG", "HDL", "CHOLHDL", "VLDL", "LDLCALC", "LDLDIRECT"  CBC    Component Value Date/Time   WBC 8.5 10/31/2020 2121   RBC 3.89 10/31/2020 2121   HGB 10.2 (L) 10/31/2020 2121   HCT 32.5 (L) 10/31/2020 2121   PLT 349 10/31/2020 2121   MCV 83.5 10/31/2020 2121   MCV 91.3 06/23/2013 1609   MCH 26.2 10/31/2020 2121   MCHC 31.4 10/31/2020 2121   RDW 15.3 10/31/2020 2121   LYMPHSABS 3.3 10/31/2020 2121   MONOABS 0.5 10/31/2020 2121   EOSABS 0.2 10/31/2020 2121   BASOSABS 0.1 10/31/2020 2121    ASSESSMENT AND PLAN: 1. Establishing care with new doctor, encounter for   2. Chronic pain of right ankle Patient with recurrence of ankle pain and swelling.  Previously diagnosed with Achilles contracture and  peroneal tendinitis.  X-ray did show small avulsive fracture of the lateral malleolus.  Patient states she was not aware of this finding. -I recommend wearing her compressive sleeve on the ankle.  Take Tylenol as needed for pain.  Will refer to orthopedics. - Ambulatory referral to Orthopedics  3. Family history of colon cancer Will have her follow-up with [place] in about 7 weeks for physical.  At that time, may refer to gastroenterology given history of early colon cancer diagnosis and mother and pancreatic cancer diagnosis in sister.  4. Burning sensation of lower extremity May be due to increased sensitivity of the cutaneous nerves at this location.  Monitor for now.     Patient was given the opportunity to ask questions.  Patient verbalized understanding of the plan and was able to repeat key elements of the plan.   This documentation was completed using Paediatric nurse.  Any transcriptional errors are unintentional.  Orders Placed This Encounter  Procedures   Ambulatory referral to Orthopedics     Requested Prescriptions    No prescriptions requested or ordered in this encounter    Return in about 7 weeks (around 11/12/2022) for physical.  Jonah Blue, MD, [facility]

## 2022-09-24 NOTE — Patient Instructions (Signed)
Please wear the compression sleeve on your right ankle during the day. Use Tylenol as needed for pain We have submitted a referral for you to see the orthopedic specialist.

## 2022-10-07 ENCOUNTER — Ambulatory Visit: Payer: Medicare HMO | Admitting: Orthopaedic Surgery

## 2022-10-11 ENCOUNTER — Telehealth: Payer: Self-pay | Admitting: Internal Medicine

## 2022-10-11 NOTE — Telephone Encounter (Signed)
Copied from CRM [id number]. Topic: Medicare AWV >> Oct 11, 2022  9:25 AM Rushie Goltz wrote: Reason for CRM: Called patient to schedule Medicare Annual Wellness Visit (AWV). Left message for patient to call back and schedule Medicare Annual Wellness Visit (AWV).  Last date of AWV: AWVI  eligible as of 09/28/2021  Please schedule an AWVI appointment at any time with [facility] VISIT.  If any questions, please contact me at [phone].    Thank you,  [facility] Support [facility] Direct dial  [phone]

## 2022-10-11 NOTE — Telephone Encounter (Signed)
Contacted Lanae Boast to schedule their annual wellness visit. Appointment made for 10/12/2022.  Thank you,  [facility] Support [facility] Direct dial  [phone]

## 2022-10-12 ENCOUNTER — Ambulatory Visit: Payer: Medicare HMO | Attending: Internal Medicine

## 2022-11-29 ENCOUNTER — Ambulatory Visit: Payer: Self-pay | Admitting: Internal Medicine

## 2022-12-31 ENCOUNTER — Ambulatory Visit: Payer: Medicare Other | Admitting: Internal Medicine
